# Patient Record
Sex: Male | Born: 1960
Health system: Southern US, Community
[De-identification: ages and names within clinical notes are randomized; demographics above are authoritative.]

## PROBLEM LIST (undated history)

## (undated) DIAGNOSIS — M1990 Unspecified osteoarthritis, unspecified site: Secondary | ICD-10-CM

## (undated) DIAGNOSIS — E119 Type 2 diabetes mellitus without complications: Secondary | ICD-10-CM

## (undated) DIAGNOSIS — R05 Cough: Secondary | ICD-10-CM

## (undated) DIAGNOSIS — F419 Anxiety disorder, unspecified: Secondary | ICD-10-CM

## (undated) DIAGNOSIS — I10 Essential (primary) hypertension: Secondary | ICD-10-CM

## (undated) DIAGNOSIS — E785 Hyperlipidemia, unspecified: Secondary | ICD-10-CM

## (undated) DIAGNOSIS — R059 Cough, unspecified: Secondary | ICD-10-CM

## (undated) DIAGNOSIS — K4090 Unilateral inguinal hernia, without obstruction or gangrene, not specified as recurrent: Secondary | ICD-10-CM

## (undated) HISTORY — PX: HERNIA REPAIR: SHX51

## (undated) HISTORY — PX: INGUINAL HERNIA REPAIR: SUR1180

## (undated) HISTORY — DX: Unilateral inguinal hernia, without obstruction or gangrene, not specified as recurrent: K40.90

## (undated) HISTORY — DX: Hyperlipidemia, unspecified: E78.5

## (undated) HISTORY — DX: Essential (primary) hypertension: I10

---

## 2006-07-11 ENCOUNTER — Emergency Department (HOSPITAL_COMMUNITY): Admission: EM | Admit: 2006-07-11 | Discharge: 2006-07-11 | Payer: Self-pay | Admitting: Emergency Medicine

## 2010-07-19 ENCOUNTER — Telehealth: Payer: Self-pay | Admitting: Cardiology

## 2010-08-11 ENCOUNTER — Ambulatory Visit: Payer: Self-pay | Admitting: Cardiology

## 2010-12-13 ENCOUNTER — Ambulatory Visit (INDEPENDENT_AMBULATORY_CARE_PROVIDER_SITE_OTHER): Payer: 59 | Admitting: General Surgery

## 2010-12-13 ENCOUNTER — Encounter (INDEPENDENT_AMBULATORY_CARE_PROVIDER_SITE_OTHER): Payer: Self-pay | Admitting: General Surgery

## 2010-12-13 VITALS — BP 120/78 | HR 72 | Temp 98.5°F | Ht 68.0 in | Wt 242.6 lb

## 2010-12-13 DIAGNOSIS — K4090 Unilateral inguinal hernia, without obstruction or gangrene, not specified as recurrent: Secondary | ICD-10-CM | POA: Insufficient documentation

## 2010-12-13 NOTE — Progress Notes (Signed)
Subjective:     Patient ID: Russell Robles, male   DOB: [birth year], 50 y.o.   MRN: [id number]    BP 120/78  Pulse 72  Temp 98.5 F (36.9 C)  Ht 5\' 8"  (1.727 m)  Wt 242 lb 9.6 oz (110.043 kg)  BMI 36.89 kg/m2    HPIThis is a 50 year old male who has about an eight-day history of left-sided groin pain his bulge. About 8 days ago he noticed some bloating and pain in his left groin as well as in his left testicle associated with some heavy activity. This has persisted. He was evaluated at [facility] where he was noted to have on CT scan a moderately large left inguinal hernia containing a loop of normal caliber bowel. He returns from his vacation and he was presented today to be evaluated for this new onset left lumbar hernia. He reports no nausea or vomiting. He reports normal bowel movements. He does state that this is worse with activity and better with rest.   Review of Systems  HENT: Negative.   Cardiovascular: Negative for chest pain.       Htn   Gastrointestinal: Negative.   Genitourinary: Negative.   Musculoskeletal: Negative.   Neurological: Negative.   Hematological: Negative.   Psychiatric/Behavioral: Negative.        Objective:   Physical Exam  Constitutional: He appears well-developed.  Neck: Neck supple.  Cardiovascular: Normal rate, regular rhythm and normal heart sounds.   No murmur heard. Pulmonary/Chest: Effort normal and breath sounds normal. No respiratory distress. He has no wheezes.  Abdominal: Soft. He exhibits no distension. There is no tenderness. Hernia confirmed negative in the right inguinal area. Left inguinal: reducilbe.  Lymphadenopathy:    He has no cervical adenopathy.       Right: No inguinal adenopathy present.       Left: No inguinal adenopathy present.       Assessment:    Left inguinal hernia DM HTN    Plan:      He has a symptomatic left inguinal hernia. We discussed open left inguinal hernia with mesh. The risks include,  but are not limited to, bleeding infection, recurrence, chronic left groin pain, seroma, as well as numbness on the medial portion of his left thigh. We discussed the indications for repair to be the symptomatic nature of his hernias as well as his young age and he is agreeable to that. Will plan on getting him scheduled as soon as possible for this hernia repaired due to with symptoms.

## 2010-12-14 ENCOUNTER — Encounter (HOSPITAL_BASED_OUTPATIENT_CLINIC_OR_DEPARTMENT_OTHER)
Admission: RE | Admit: 2010-12-14 | Discharge: 2010-12-14 | Disposition: A | Discharge status: Home / Self Care | Payer: 59 | Source: Ambulatory Visit | Attending: General Surgery | Admitting: General Surgery

## 2010-12-14 LAB — CBC
HCT: 39.5 % (ref 39.0–52.0)
Hemoglobin: 14 g/dL (ref 13.0–17.0)
MCH: 29.2 pg (ref 26.0–34.0)
MCHC: 35.4 g/dL (ref 30.0–36.0)
MCV: 82.5 fL (ref 78.0–100.0)
Platelets: 232 10*3/uL (ref 150–400)
RBC: 4.79 MIL/uL (ref 4.22–5.81)
RDW: 12.7 % (ref 11.5–15.5)
WBC: 9.3 10*3/uL (ref 4.0–10.5)

## 2010-12-14 LAB — BASIC METABOLIC PANEL
BUN: 14 mg/dL (ref 6–23)
CO2: 29 mEq/L (ref 19–32)
Calcium: 9.5 mg/dL (ref 8.4–10.5)
Chloride: 101 mEq/L (ref 96–112)
Creatinine, Ser: 0.9 mg/dL (ref 0.50–1.35)
GFR calc Af Amer: >60 mL/min (ref >60)
GFR calc non Af Amer: >60 mL/min (ref >60)
Glucose, Bld: 173 mg/dL — ABNORMAL HIGH (ref 70–99)
Potassium: 3.5 mEq/L (ref 3.5–5.1)
Sodium: 139 mEq/L (ref 135–145)

## 2010-12-14 LAB — DIFFERENTIAL
Basophils Absolute: 0 10*3/uL (ref 0.0–0.1)
Basophils Relative: 0 % (ref 0–1)
Eosinophils Absolute: 0.4 10*3/uL (ref 0.0–0.7)
Eosinophils Relative: 4 % (ref 0–5)
Lymphocytes Relative: 32 % (ref 12–46)
Lymphs Abs: 2.9 10*3/uL (ref 0.7–4.0)
Monocytes Absolute: 1.2 10*3/uL — ABNORMAL HIGH (ref 0.1–1.0)
Monocytes Relative: 13 % — ABNORMAL HIGH (ref 3–12)
Neutro Abs: 4.8 10*3/uL (ref 1.7–7.7)
Neutrophils Relative %: 51 % (ref 43–77)

## 2010-12-16 ENCOUNTER — Ambulatory Visit (HOSPITAL_BASED_OUTPATIENT_CLINIC_OR_DEPARTMENT_OTHER)
Admission: RE | Admit: 2010-12-16 | Discharge: 2010-12-16 | Disposition: A | Discharge status: Home / Self Care | Payer: 59 | Source: Ambulatory Visit | Attending: General Surgery | Admitting: General Surgery

## 2010-12-16 DIAGNOSIS — Z01812 Encounter for preprocedural laboratory examination: Secondary | ICD-10-CM | POA: Insufficient documentation

## 2010-12-16 DIAGNOSIS — Z01810 Encounter for preprocedural cardiovascular examination: Secondary | ICD-10-CM | POA: Insufficient documentation

## 2010-12-16 DIAGNOSIS — K4090 Unilateral inguinal hernia, without obstruction or gangrene, not specified as recurrent: Secondary | ICD-10-CM | POA: Insufficient documentation

## 2010-12-16 DIAGNOSIS — I10 Essential (primary) hypertension: Secondary | ICD-10-CM | POA: Insufficient documentation

## 2010-12-16 DIAGNOSIS — E119 Type 2 diabetes mellitus without complications: Secondary | ICD-10-CM | POA: Insufficient documentation

## 2010-12-16 LAB — GLUCOSE, CAPILLARY
Glucose-Capillary: 136 mg/dL — ABNORMAL HIGH (ref 70–99)
Glucose-Capillary: 206 mg/dL — ABNORMAL HIGH (ref 70–99)

## 2010-12-20 NOTE — Op Note (Signed)
NAME:  Russell Robles, Russell Robles NO.:  [id number]  MEDICAL RECORD NO.:  [id number]  LOCATION:                                 FACILITY:  PHYSICIAN:  Juanetta Gosling, MD     DATE OF BIRTH:  DATE OF PROCEDURE:  12/16/2010 DATE OF DISCHARGE:                              OPERATIVE REPORT   PREOPERATIVE DIAGNOSIS:  Left inguinal hernia.  POSTOPERATIVE DIAGNOSIS:  Large indirect left inguinal hernia.  PROCEDURE:  Left inguinal repair with Ultrapro hernia system.  SURGEON:  Juanetta Gosling, MD.  ASSISTANT:  None.  ANESTHESIA:  General with a TAP block.  SPECIMENS:  None.  DRAINS:  None.  COMPLICATIONS:  None.  ESTIMATED BLOOD LOSS:  Minimal.  DISPOSITION:  To recovery room in stable condition.  INDICATIONS:  This is a 50 year old male with a very symptomatic left inguinal hernia.  We discussed the risks and benefits of an open repair.  DESCRIPTION OF PROCEDURE:  After informed consent was obtained, the patient was taken to the operating room.  He was first administered a TAP block by Dr. Hart Robinsons.  He was then administered 1 gram of intravenous cefazolin.  Sequential compression devices were placed on lower extremities prior to induction with anesthesia.  He was then placed under general anesthesia without complication.  His groin was then prepped and draped in a standard sterile surgical fashion. Surgical time-out was then performed.  I made a generous left groin incision due to his body habitus.  This was a very difficult operation due to his body habitus.  I located the superficial epigastric veins and I ligated these.  I then finally was able to identify his external abdominal oblique and a large hernia that was present.  I incised his external oblique through his ring. Eventually, I was able to encircle his spermatic cord.  He had a large cord lipoma as well as a hernia sac present.  I ligated the hernia sac and placed this back inside  the abdomen and I excised the cord lipoma as well.  He had a fairly large indirect defect following this and a very weak floor and I elected to place a Ultrapro hernia system in this defect.  I developed a space with a Ray-Tec sponge.  I preserved the remainder of the cord structures as well as the vas deferens throughout the procedure.  Eventually, I was able to lay the bottom portion of the bilayer flat.  I pulled the top part open and laid this flat.  I made a T cut, wrapped it around the spermatic cord.  I then sutured this about every centimeter to the pubic tubercle twice through the inguinal ligament, tacking the T cut together into the inguinal ligament, as well as superiorly to the internal oblique.  The lateral portion eventually just laid flat under the external oblique.  This mesh appeared to fit well and that there was no evidence of any further defect.  I then irrigated.  Hemostasis was obtained.  I closed this with 2-0 Vicryl, 3-0 Vicryl, 4-0 Monocryl and Dermabond.  I injected an additional 10 mL of 4% Marcaine upon completion.  He tolerated  this well, was extubated in the operating room, and was transferred to the recovery room in stable condition.     Juanetta Gosling, MD     MCW/MEDQ  D:  12/16/2010  T:  12/16/2010  Job:  [id number]  cc:   Dewain Penning, MD  Electronically Signed by Emelia Loron MD on 12/20/2010 07:05:07 PM

## 2011-01-04 ENCOUNTER — Ambulatory Visit (INDEPENDENT_AMBULATORY_CARE_PROVIDER_SITE_OTHER): Payer: 59 | Admitting: General Surgery

## 2011-01-04 ENCOUNTER — Encounter (INDEPENDENT_AMBULATORY_CARE_PROVIDER_SITE_OTHER): Payer: Self-pay | Admitting: General Surgery

## 2011-01-04 DIAGNOSIS — Z09 Encounter for follow-up examination after completed treatment for conditions other than malignant neoplasm: Secondary | ICD-10-CM

## 2011-01-04 NOTE — Progress Notes (Signed)
Subjective:     Patient ID: Russell Robles, male   DOB: 1961-05-20, 50 y.o.   MRN: [id number]  HPI This is a 50 year old male with a symptomatic left groin hernia repair that I took to the operating room on June 28. He had a very large left renal hernia repair the repair with mesh. He had the expected postoperative swelling. He will he now is doing very well and only complains of a little bit of numbness on the lateral portion of the left side of the scrotum. He otherwise is doing a lot of his normal activity except for heavy lifting at this point. He has no other complaints.  Review of Systems     Objective:   Physical Exam Well healing left groin incision with no infection, minimal edema    Assessment:     S/p left inguinal hernia repair    Plan:        He is doing very well after assuring inguinal hernia repair. I told him he could go back to work on the 30th of this month. He is going to me if he has any questions or any other further problems otherwise we'll see him back as needed.

## 2011-02-02 ENCOUNTER — Emergency Department (INDEPENDENT_AMBULATORY_CARE_PROVIDER_SITE_OTHER): Payer: BC Managed Care – PPO

## 2011-02-02 ENCOUNTER — Emergency Department (HOSPITAL_BASED_OUTPATIENT_CLINIC_OR_DEPARTMENT_OTHER)
Admission: EM | Admit: 2011-02-02 | Discharge: 2011-02-03 | Disposition: A | Discharge status: Home / Self Care | Payer: BC Managed Care – PPO | Attending: Emergency Medicine | Admitting: Emergency Medicine

## 2011-02-02 ENCOUNTER — Encounter (HOSPITAL_BASED_OUTPATIENT_CLINIC_OR_DEPARTMENT_OTHER): Payer: Self-pay | Admitting: *Deleted

## 2011-02-02 DIAGNOSIS — W219XXA Striking against or struck by unspecified sports equipment, initial encounter: Secondary | ICD-10-CM | POA: Insufficient documentation

## 2011-02-02 DIAGNOSIS — Y9364 Activity, baseball: Secondary | ICD-10-CM | POA: Insufficient documentation

## 2011-02-02 DIAGNOSIS — E119 Type 2 diabetes mellitus without complications: Secondary | ICD-10-CM | POA: Insufficient documentation

## 2011-02-02 DIAGNOSIS — S63619A Unspecified sprain of unspecified finger, initial encounter: Secondary | ICD-10-CM

## 2011-02-02 DIAGNOSIS — X58XXXA Exposure to other specified factors, initial encounter: Secondary | ICD-10-CM

## 2011-02-02 DIAGNOSIS — S61219A Laceration without foreign body of unspecified finger without damage to nail, initial encounter: Secondary | ICD-10-CM

## 2011-02-02 DIAGNOSIS — I10 Essential (primary) hypertension: Secondary | ICD-10-CM | POA: Insufficient documentation

## 2011-02-02 DIAGNOSIS — Y9239 Other specified sports and athletic area as the place of occurrence of the external cause: Secondary | ICD-10-CM | POA: Insufficient documentation

## 2011-02-02 DIAGNOSIS — S61209A Unspecified open wound of unspecified finger without damage to nail, initial encounter: Secondary | ICD-10-CM | POA: Insufficient documentation

## 2011-02-02 DIAGNOSIS — S61409A Unspecified open wound of unspecified hand, initial encounter: Secondary | ICD-10-CM

## 2011-02-02 DIAGNOSIS — E785 Hyperlipidemia, unspecified: Secondary | ICD-10-CM | POA: Insufficient documentation

## 2011-02-02 DIAGNOSIS — S63259A Unspecified dislocation of unspecified finger, initial encounter: Secondary | ICD-10-CM

## 2011-02-02 DIAGNOSIS — S6390XA Sprain of unspecified part of unspecified wrist and hand, initial encounter: Secondary | ICD-10-CM | POA: Insufficient documentation

## 2011-02-02 MED ORDER — LIDOCAINE HCL 2 % IJ SOLN
INTRAMUSCULAR | Status: AC
  Filled 2011-02-02: qty 1

## 2011-02-02 MED ORDER — LIDOCAINE HCL 2 % IJ SOLN
20.0000 mL | Freq: Once | INTRAMUSCULAR | Status: AC
  Administered 2011-02-02: 20 mg via INTRADERMAL

## 2011-02-02 NOTE — ED Notes (Signed)
Playing softball and fell onto the ground when he was sliding. Left hand inj.

## 2011-02-03 MED ORDER — AMOXICILLIN-POT CLAVULANATE 875-125 MG PO TABS: 1.0000 | ORAL_TABLET | Freq: Two times a day (BID) | ORAL | Status: AC

## 2011-02-03 MED ORDER — IBUPROFEN 800 MG PO TABS: 800.0000 mg | ORAL_TABLET | Freq: Three times a day (TID) | ORAL | Status: AC | PRN

## 2011-02-03 NOTE — ED Provider Notes (Signed)
History    the patient presents for evaluation of injury to the left little finger. He reports that he was playing baseball game, was running, and dove head first to slide into base, and reports that he feels that he jammed his left little finger at the proximal interphalangeal joint. He at that time had pain, looked at his left little finger, and noted a laceration to the volar surface of the finger, and noted some mild deformity but he thought was a "jammed finger." He then pulled traction on the little finger with return of normal alignment. At this time he can flex and extend the left little finger at all joints without significant pain, except deep to the laceration at the volar surface. He has no apparent or significant swelling or deformity at this time otherwise. He denies any other injury to his hand, wrist, elbow, shoulder, neck, back or elsewhere. He denies head injury. He denies numbness distal to the injury.  CSN: [id number] Arrival date & time: 02/02/2011 10:22 PM  Chief Complaint  Patient presents with  . Hand Injury   Patient is a 50 y.o. male presenting with hand pain and skin laceration. The history is provided by the patient.  Hand Pain This is a new problem. The current episode started 1 to 2 hours ago. The problem occurs constantly. The problem has been resolved. Pertinent negatives include no chest pain, no abdominal pain, no headaches and no shortness of breath. The symptoms are aggravated by nothing. Relieved by: The patient reports at this time that there are no worsening factors for his pain, which has essentially resolved, after he had pulled traction on the finger to reduce apparent deformity or dislocation. The treatment provided significant (Significant relief reported after pulling traction on the little finger and presumably reducing a dislocation) relief.  Laceration  The incident occurred 1 to 2 hours ago. The laceration is located on the left hand (Volar surface of the  left little finger over the proximal interphalangeal joint). The laceration is 2 cm in size. The laceration mechanism is unknown (Sliding in a gravel/dirt baseball field).The pain is mild. The pain has been constant since onset. He reports no foreign bodies present. His tetanus status is UTD.    Past Medical History  Diagnosis Date  . Hypertension   . Diabetes mellitus   . Hyperlipidemia     History reviewed. No pertinent past surgical history.  Family History  Problem Relation Age of Onset  . COPD Mother   . Lung cancer Sister     History  Substance Use Topics  . Smoking status: Never Smoker   . Smokeless tobacco: Not on file  . Alcohol Use: No      Review of Systems  HENT: Negative.   Eyes: Negative.   Respiratory: Negative for shortness of breath.   Cardiovascular: Negative for chest pain.  Gastrointestinal: Negative for abdominal pain.  Genitourinary: Negative for flank pain.  Musculoskeletal: Negative for back pain, joint swelling and arthralgias.  Skin: Positive for wound.  Neurological: Negative for headaches.  Hematological: Does not bruise/bleed easily.  Psychiatric/Behavioral: Negative.     Physical Exam  BP 146/91  Pulse 87  Temp(Src) 99.4 F (37.4 C) (Oral)  Resp 20  SpO2 100%  Physical Exam  Nursing note and vitals reviewed. Constitutional: He is oriented to person, place, and time. He appears well-nourished. No distress.  HENT:  Head: Normocephalic and atraumatic.  Eyes: EOM are normal. Pupils are equal, round, and reactive to light.  Neck: Normal range of motion. Neck supple.  Cardiovascular: Normal rate and regular rhythm.   Pulmonary/Chest: Effort normal and breath sounds normal.  Abdominal: Soft. Bowel sounds are normal. He exhibits no distension. There is no tenderness.  Musculoskeletal: Normal range of motion. He exhibits no edema and no tenderness.       Left hand: He exhibits laceration. He exhibits normal range of motion, no  tenderness, no bony tenderness, normal two-point discrimination, normal capillary refill, no deformity and no swelling. normal sensation noted. Normal strength noted.       Hands:      The patient has no instability of the proximal interphalangeal joint of the left little finger, nor of any other joints of the hand. There is no deformity apparent to suggest fracture or dislocation at this time. The patient has full-strength and range of motion of flexion and extension of the left little finger as evaluated after local anesthetic applied to his laceration. There is no suggestion of tendinous disruption or joint instability.  Neurological: He is alert and oriented to person, place, and time. No cranial nerve deficit. Coordination normal.  Skin: Skin is warm and dry. No rash noted. No erythema.  Psychiatric: He has a normal mood and affect. His behavior is normal. Judgment and thought content normal.    ED Course  LACERATION REPAIR Performed by: Felisa Bonier Authorized by: Kennon Rounds D Consent: Verbal consent obtained. Written consent not obtained. Risks and benefits: risks, benefits and alternatives were discussed Consent given by: patient Patient understanding: patient states understanding of the procedure being performed Relevant documents: relevant documents present and verified Test results: test results available and properly labeled Imaging studies: imaging studies available Required items: required blood products, implants, devices, and special equipment available Patient identity confirmed: verbally with patient and arm band Time out: Immediately prior to procedure a "time out" was called to verify the correct patient, procedure, equipment, support staff and site/side marked as required. Body area: upper extremity Location details: left small finger Laceration length: 2 cm Contamination: The wound is contaminated. Foreign body present: Dirt and debris. Tendon involvement:  none Nerve involvement: none Vascular damage: no Anesthesia: local infiltration Local anesthetic: lidocaine 2% without epinephrine Anesthetic total: 2 ml Patient sedated: no Preparation: Patient was prepped and draped in the usual sterile fashion. Irrigation solution: tap water Irrigation method: tap Amount of cleaning: extensive (Extensive irrigation under running tap water was performed prior to extensive scrubbing of the wound with chlorhexidine based hand soap to wash out any dirt or debris after anesthesia was performed.) Debridement: none Degree of undermining: none Skin closure: 4-0 nylon Number of sutures: 4 Technique: simple Approximation: loose Approximation difficulty: simple Dressing: 4x4 sterile gauze, antibiotic ointment, gauze roll and splint Patient tolerance: Patient tolerated the procedure well with no immediate complications.    MDM No apparent fracture or dislocation is seen on the x-ray images of the patient's left hand. He has sustained a mildly contaminated left little finger volar laceration, simple otherwise, which I have repaired. Because of the reported history suggestive of a previous dislocation prior to arrival which the patient would then have self reduced with long jejunal traction, I am diagnosing him with a sprain of the left little finger and will treat him with analgesics by NSAIDs as well as a finger splint for a week. The patient was instructed on laceration care and followup.      Felisa Bonier, MD 02/03/11 [phone]

## 2011-02-03 NOTE — ED Notes (Signed)
Dressed finger with sterile dressing

## 2011-12-04 IMAGING — CR DG HAND COMPLETE 3+V*L*
3 series · 3 of 3 positions shown · non-contrast
Comparison: None.

CLINICAL DATA: Injury to the left hand there are a softball game.
Pain to the fifth digit with laceration at the PIP joint.

LEFT HAND - COMPLETE 3+ VIEW

[x hand pa left]
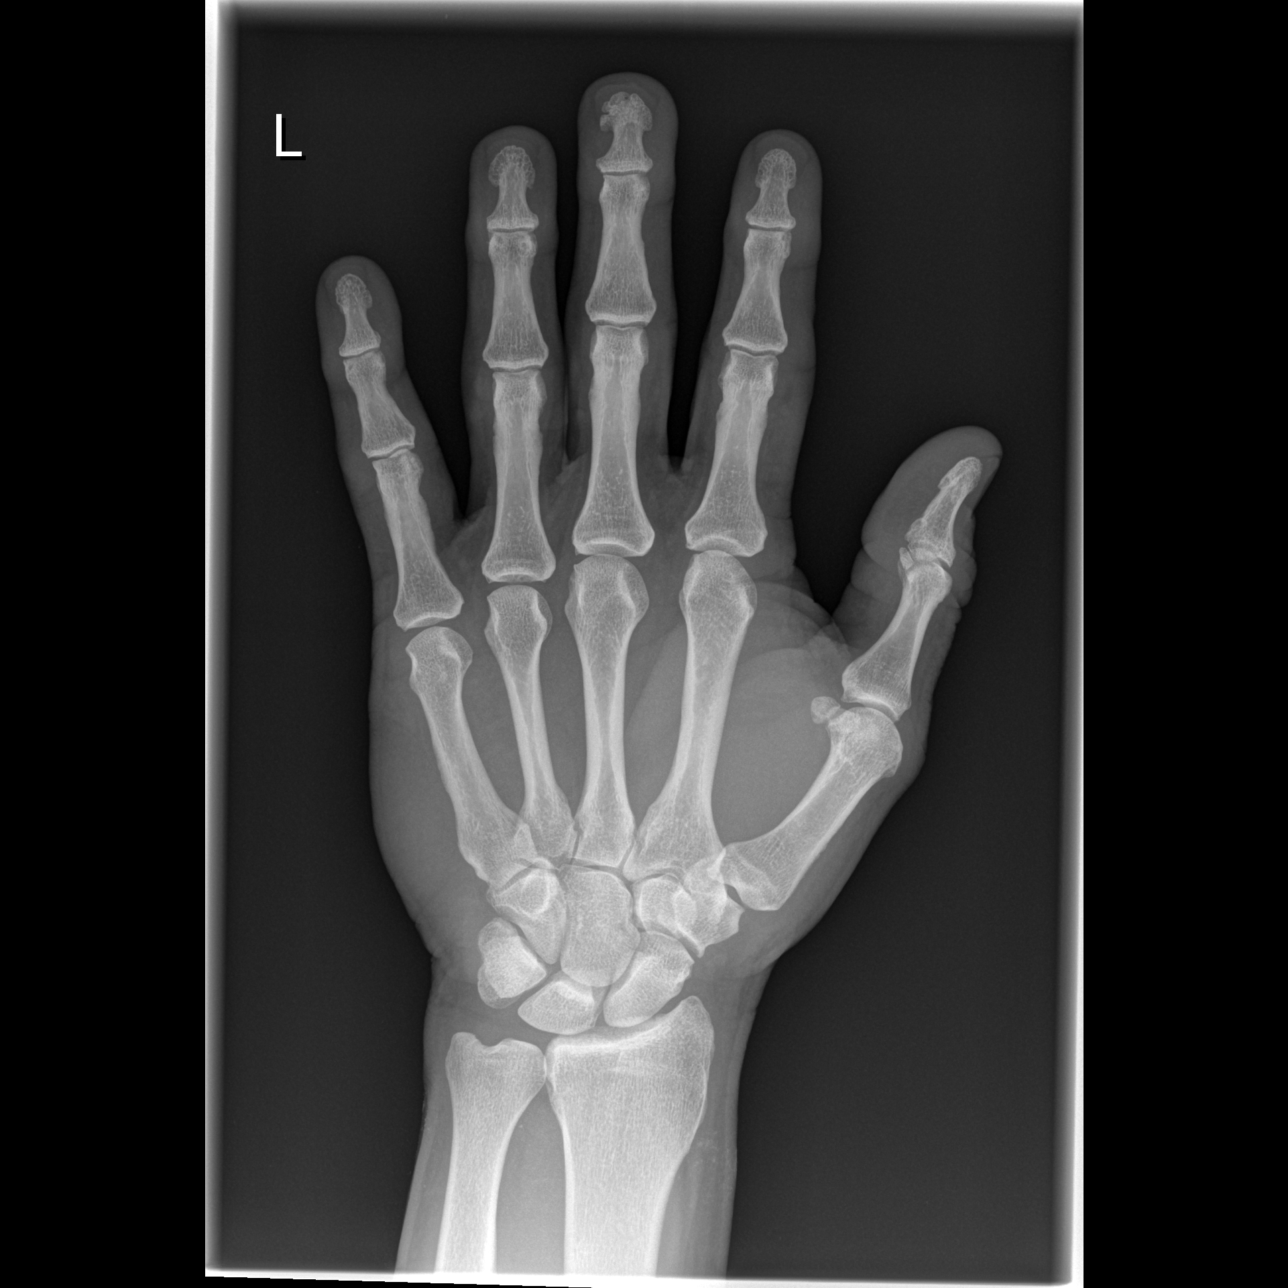

[x hand oblique left]
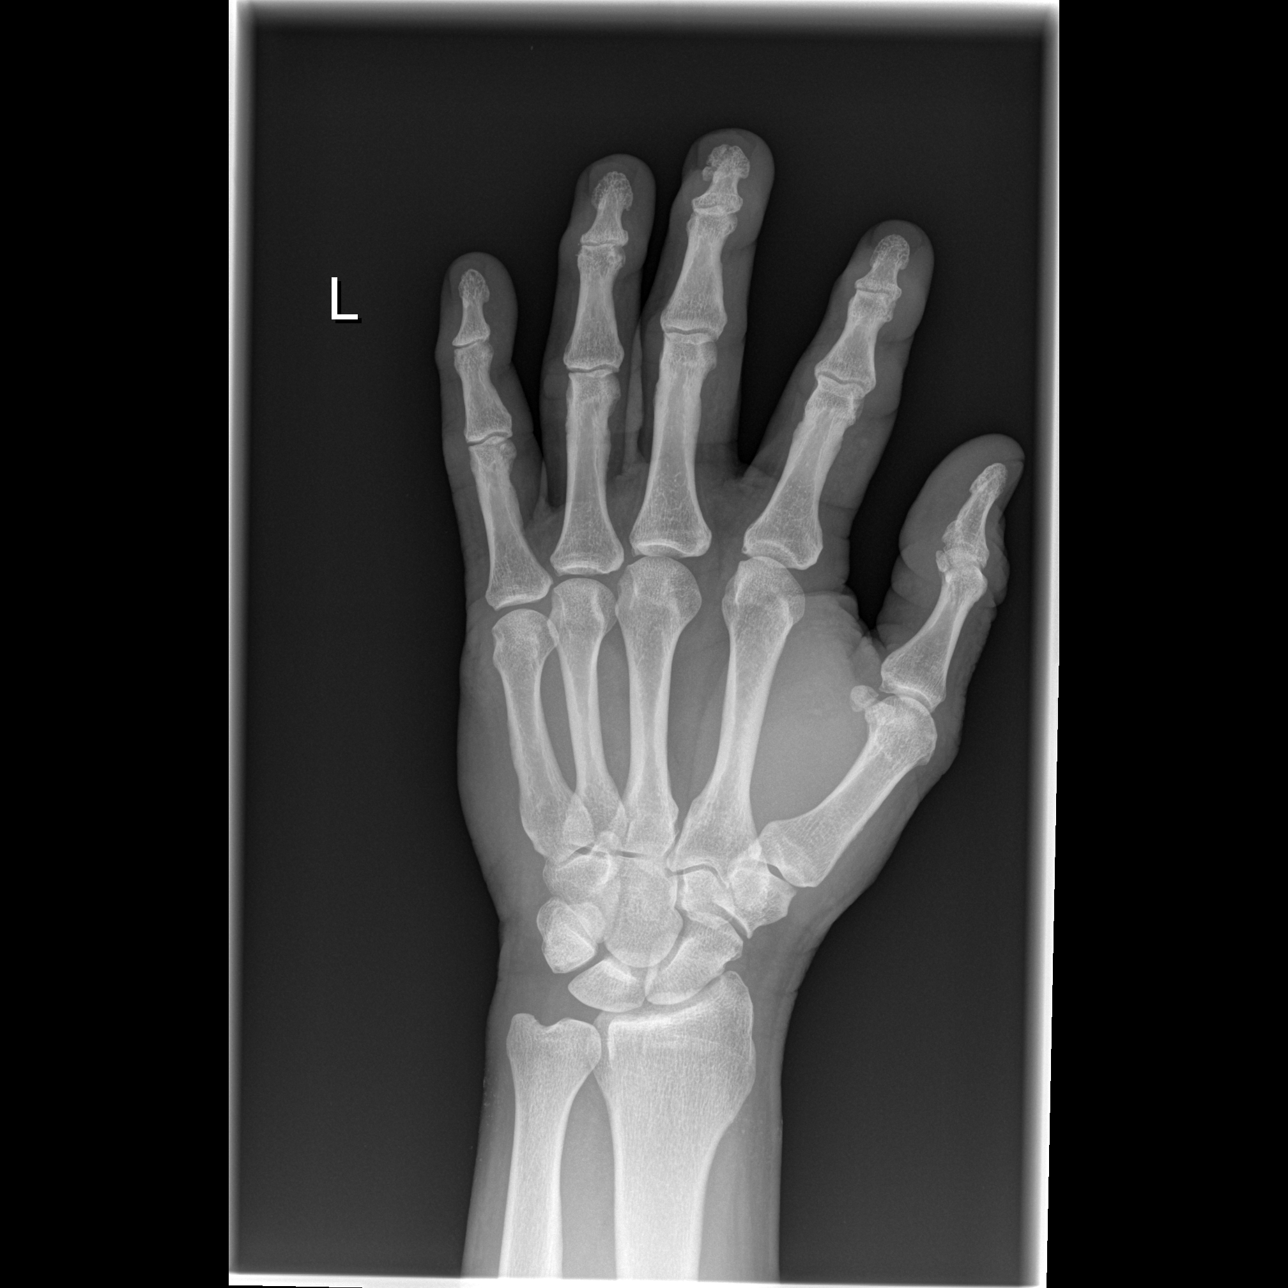

[x hand lat left]
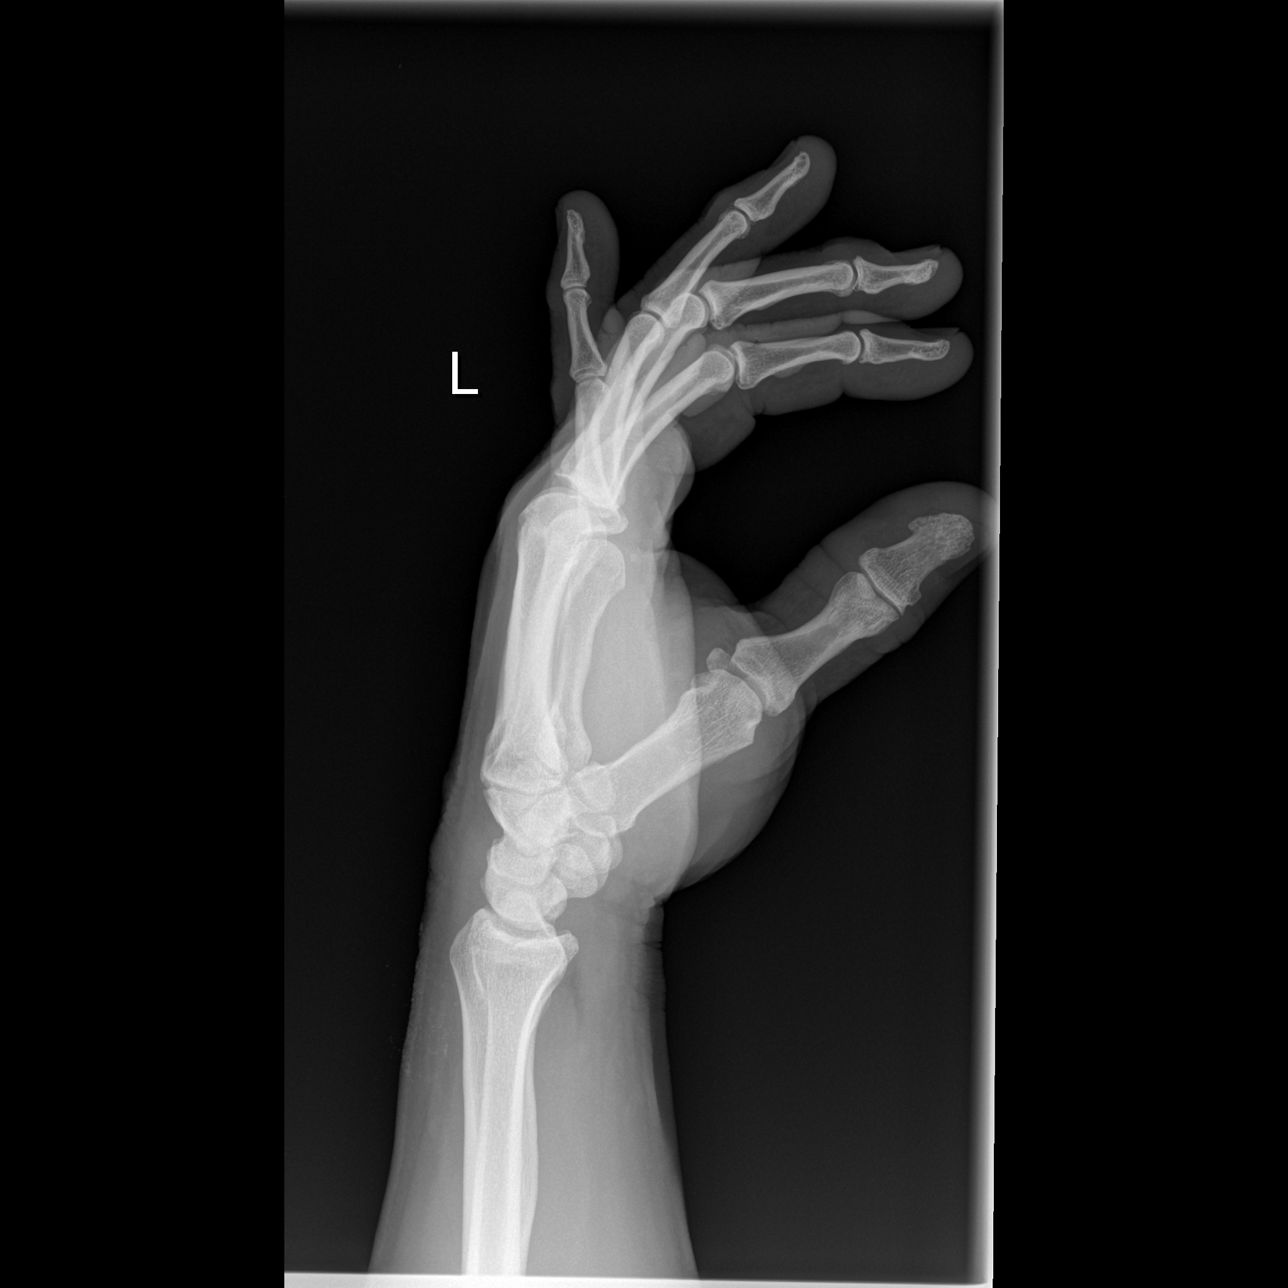

[3 of 3 positions shown; findings below may reference images not displayed]

FINDINGS: Mild degenerative changes in the STT joints, first
carpometacarpal joint, and distal interphalangeal joints. No
evidence of acute fracture or subluxation.  No focal bone lesions.
Bone matrix and cortex appear intact.  No abnormal radiopaque
densities in the soft tissues.
IMPRESSION: Mild degenerative changes.  No acute bony abnormalities
demonstrated.

## 2013-02-18 DEATH — deceased

## 2013-08-24 LAB — HEMOGLOBIN A1C: Hemoglobin A1C: 6.4

## 2014-09-30 ENCOUNTER — Other Ambulatory Visit: Admit: 2014-09-30 | Disposition: A | Discharge status: Home / Self Care | Payer: Self-pay

## 2014-09-30 LAB — SYNOVIAL CELL COUNT + DIFF, W/ CRYSTALS
Basophil: 0 %
Crystals, Joint Fluid: NONE SEEN
Eosinophil: 2 %
Lymphocytes: 18 %
Neutrophils: 76 %
Nucleated Cell Count: 1786 /mm3
Other Cells BF: 0 %
Other Mononuclear Cells: 4 %

## 2014-10-04 LAB — BODY FLUID CULTURE

## 2015-06-18 DIAGNOSIS — H2513 Age-related nuclear cataract, bilateral: Secondary | ICD-10-CM | POA: Insufficient documentation

## 2015-06-18 DIAGNOSIS — I152 Hypertension secondary to endocrine disorders: Secondary | ICD-10-CM | POA: Insufficient documentation

## 2015-06-18 DIAGNOSIS — H524 Presbyopia: Secondary | ICD-10-CM | POA: Insufficient documentation

## 2015-06-18 DIAGNOSIS — E1159 Type 2 diabetes mellitus with other circulatory complications: Secondary | ICD-10-CM | POA: Insufficient documentation

## 2015-06-18 DIAGNOSIS — E118 Type 2 diabetes mellitus with unspecified complications: Secondary | ICD-10-CM | POA: Insufficient documentation

## 2015-06-18 DIAGNOSIS — E1165 Type 2 diabetes mellitus with hyperglycemia: Secondary | ICD-10-CM | POA: Insufficient documentation

## 2015-06-18 DIAGNOSIS — E785 Hyperlipidemia, unspecified: Principal | ICD-10-CM

## 2015-06-18 DIAGNOSIS — E1169 Type 2 diabetes mellitus with other specified complication: Secondary | ICD-10-CM | POA: Insufficient documentation

## 2015-06-18 DIAGNOSIS — I10 Essential (primary) hypertension: Secondary | ICD-10-CM | POA: Insufficient documentation

## 2015-09-23 LAB — BASIC METABOLIC PANEL
BUN: 29 — AB (ref 4–21)
Creatinine: 1 (ref 0.6–1.3)
Glucose: 413
Potassium: 4 (ref 3.4–5.3)
Sodium: 130 — AB (ref 137–147)

## 2015-09-23 LAB — LIPID PANEL
Cholesterol: 180 (ref 0–200)
HDL: 34 — AB (ref 35–70)
LDL Cholesterol: 104
Triglycerides: 378 — AB (ref 40–160)

## 2015-09-23 LAB — HEMOGLOBIN A1C: Hemoglobin A1C: 13.1

## 2016-01-02 LAB — HEPATIC FUNCTION PANEL
ALT: 22 (ref 10–40)
AST: 19 (ref 14–40)
Alkaline Phosphatase: 54 (ref 25–125)
Bilirubin, Total: 0.6

## 2016-01-02 LAB — CBC AND DIFFERENTIAL
HCT: 41 (ref 41–53)
Hemoglobin: 14.5 (ref 13.5–17.5)
Platelets: 234 (ref 150–399)
WBC: 8.5

## 2016-01-02 LAB — HEMOGLOBIN A1C
Hemoglobin A1C: 6.2
Hemoglobin A1C: 6.7

## 2016-01-02 LAB — BASIC METABOLIC PANEL
BUN: 15 (ref 4–21)
Creatinine: 0.9 (ref 0.6–1.3)
Glucose: 153
Potassium: 3.7 (ref 3.4–5.3)
Sodium: 139 (ref 137–147)

## 2016-01-02 LAB — LIPID PANEL
Cholesterol: 126 (ref 0–200)
HDL: 36 (ref 35–70)
Triglycerides: 156 (ref 40–160)

## 2016-01-02 LAB — PSA: PSA: 0.36

## 2016-01-02 LAB — TSH: TSH: 0.47 (ref 0.41–5.90)

## 2016-10-22 LAB — HEMOGLOBIN A1C: Hemoglobin A1C: 7.4

## 2016-10-22 LAB — LIPID PANEL
Cholesterol: 114 (ref 0–200)
HDL: 34 — AB (ref 35–70)
LDL Cholesterol: 66
Triglycerides: 126 (ref 40–160)

## 2017-02-11 LAB — BASIC METABOLIC PANEL
BUN: 12 (ref 4–21)
Creatinine: 0.9 (ref 0.6–1.3)
Glucose: 180
Potassium: 3.7 (ref 3.4–5.3)
Sodium: 139 (ref 137–147)

## 2017-02-11 LAB — LIPID PANEL
Cholesterol: 131 (ref 0–200)
HDL: 32 — AB (ref 35–70)
LDL Cholesterol: 83
Triglycerides: 120 (ref 40–160)

## 2017-02-11 LAB — HEPATIC FUNCTION PANEL
ALT: 25 (ref 10–40)
AST: 24 (ref 14–40)
Alkaline Phosphatase: 53 (ref 25–125)
Bilirubin, Total: 0.8

## 2017-02-11 LAB — HEMOGLOBIN A1C: Hemoglobin A1C: 7

## 2017-02-11 LAB — CBC AND DIFFERENTIAL
HCT: 41 (ref 41–53)
Hemoglobin: 14.6 (ref 13.5–17.5)
Platelets: 218 (ref 150–399)
WBC: 8.7

## 2017-02-11 LAB — TSH: TSH: 0.8 (ref 0.41–5.90)

## 2017-04-06 ENCOUNTER — Ambulatory Visit: Payer: Self-pay | Admitting: General Surgery

## 2017-04-12 ENCOUNTER — Encounter: Payer: Self-pay | Admitting: Family Medicine

## 2017-04-12 ENCOUNTER — Ambulatory Visit: Payer: 59 | Admitting: Family Medicine

## 2017-04-12 VITALS — BP 133/88 | HR 59 | Ht 68.0 in | Wt 224.7 lb

## 2017-04-12 DIAGNOSIS — E1159 Type 2 diabetes mellitus with other circulatory complications: Secondary | ICD-10-CM

## 2017-04-12 DIAGNOSIS — E785 Hyperlipidemia, unspecified: Secondary | ICD-10-CM

## 2017-04-12 DIAGNOSIS — E66811 Obesity, class 1: Secondary | ICD-10-CM | POA: Insufficient documentation

## 2017-04-12 DIAGNOSIS — E119 Type 2 diabetes mellitus without complications: Secondary | ICD-10-CM

## 2017-04-12 DIAGNOSIS — E669 Obesity, unspecified: Secondary | ICD-10-CM

## 2017-04-12 DIAGNOSIS — I152 Hypertension secondary to endocrine disorders: Secondary | ICD-10-CM

## 2017-04-12 DIAGNOSIS — I10 Essential (primary) hypertension: Secondary | ICD-10-CM

## 2017-04-12 DIAGNOSIS — E1169 Type 2 diabetes mellitus with other specified complication: Secondary | ICD-10-CM

## 2017-04-12 DIAGNOSIS — Z723 Lack of physical exercise: Secondary | ICD-10-CM | POA: Insufficient documentation

## 2017-04-12 NOTE — Progress Notes (Signed)
New patient office visit note:  Impression and Recommendations:    1. Hyperlipidemia associated with type 2 diabetes mellitus ([employer])   2. Diabetes mellitus without complication ([place])   3. Hypertension associated with diabetes ([place])   4. Obesity, Class I, BMI 30-34.9   5. Inactivity      There are no diagnoses linked to this encounter.  No problem-specific Assessment & Plan notes found for this encounter.   The patient was counseled, risk factors were discussed, anticipatory guidance given.   New Prescriptions   No medications on file    Meds ordered this encounter  Medications  . Linagliptin-Metformin HCl (JENTADUETO) 2.10-998 MG TABS    Sig: Take 1 tablet by mouth 2 (two) times daily.  . metoprolol succinate (TOPROL-XL) 50 MG 24 hr tablet    Sig: Take 1 tablet by mouth daily.    Discontinued Medications   ATENOLOL (TENORMIN) 50 MG TABLET    Take 50 mg by mouth daily.     SITAGLIPTAN-METFORMIN (JANUMET) 50-1000 MG PER TABLET    Take 1 tablet by mouth 2 (two) times daily with a meal.      Modified Medications   No medications on file    Orders Placed This Encounter  Procedures  . Microalbumin / creatinine urine ratio  . Amb Referral to Nutrition and Diabetic E  . POCT glycosylated hemoglobin (Hb A1C)     Gross side effects, risk and benefits, and alternatives of medications discussed with patient.  Patient is aware that all medications have potential side effects and we are unable to predict every side effect or drug-drug interaction that may occur.  Expresses verbal understanding and consents to current therapy plan and treatment regimen.  Return in about 4 weeks (around 05/10/2017) for Dm , bp, nutrition counseling f/up, repeat A1c and UrineMicroalb.  Please see AVS handed out to patient at the end of our visit for further patient instructions/ counseling done pertaining to today's office visit.    Note: This document was prepared using Dragon voice  recognition software and may include unintentional dictation errors.  ----------------------------------------------------------------------------------------------------------------------    Subjective:    Chief complaint:   Chief Complaint  Patient presents with  . Establish Care     HPI: Russell Robles is a pleasant 56 y.o. male who presents to [place] at [facility] today to review their medical history with me and establish care.   I asked the patient to review their chronic problem list with me to ensure everything was updated and accurate.    All recent office visits with other providers, any medical records that patient brought in etc  - I reviewed today.     Also asked pt to get me medical records from [facility] providers/ specialists that they had seen within the past 3-5 years- if they are in private practice and/or do not work for a [employer], [facility], [place], [place] or [employer] owned practice.  Told them to call their specialists to clarify this if they are not sure.   Dr Charlcie Cradle- [facility] in [place].  Prior pcp.   Wife had gastric bypass couple yrs ago--> helped pt cut calories and lost about 40lbs or so in past 3-4 yrs.   Used to be athlete.     Late 80-90's.  Got dizzy and blacked out--> dx with HTN.   Used to be on atenolol and HCTZ.   On atenolol---> seemed like BP was better controlled.   -  Kidney function on 8\25\18 was serum creatinine 0.92, sodium 139, potassium 3.7, chloride 108, BUN 12, glucose 180.  LFTs   Pre-DM from early 90's- up to 2000 or so.  Then DM- dx about 10-35yrs. Started on metfrmin- then moved to janumet--> worked great but insurance changed and they didn't cover that med- so changed to [employer].   FBS- 140, 170, 180.  As low as 120- highest 183. A1c roughly.   --->  On 8\25\18 A1c was 7.0.  Prior A1c on 10/22/2016 was 7.4, on 01/02/2016 was 6.7, on 09/23/2015 A1c was 13.1, on 06/16/2015 A1c was 7.2, In on March 15, 2014 was 6.2  and August 24, 2013 was 6.4. --->  Dr Amalia Hailey, micheal wayne- optho at [facility]. Gets them yrly- No DM Retinopthy last done on 11/28/2016.    HLD- dx early 90's-   last cholesterol panel was done on 02/11/2017: total cholesterol 131, triglycerides 120, LDL 83 and HDL 32.   Were AST 24, ALT 25, alkaline phosphatase 53 and total bilirubin 0.8.   TSH recently obtained was 0.799.  Health Maintenance Status  Date Due Completion Dates  FOOT EXAM 01/05/2017 01/06/2016  INFLUENZA VACCINE 02/18/2017 06/19/2015  HEMOGLOBIN A1C 08/14/2017 02/11/2017, 10/22/2016  URINE MICROALBUMIN 10/22/2017 10/22/2016, 09/23/2015  OPHTHALMOLOGY EXAM 11/28/2017 11/28/2016, 11/28/2016  COLONOSCOPY 09/08/2021 09/09/2011 (N/S)  Comment on 09/09/2011: allscripts    Cardiac studies: 08/08/06   Problem  Diabetes Mellitus Without Complication ([name])  Hypertension Associated With Diabetes ([name])  Hyperlipidemia Associated With Type 2 Diabetes Mellitus ([name])  Obesity, Class I, Bmi 30-34.9  Inactivity  Age-Related Nuclear Cataract of Both Eyes      Wt Readings from Last 3 Encounters:  04/12/17 224 lb 11.2 oz (101.9 kg)  12/13/10 242 lb 9.6 oz (110 kg)   BP Readings from Last 3 Encounters:  04/12/17 133/88  02/02/11 146/91  12/13/10 120/78   Pulse Readings from Last 3 Encounters:  04/12/17 (!) 59  02/02/11 87  12/13/10 72   BMI Readings from Last 3 Encounters:  04/12/17 34.17 kg/m  12/13/10 36.89 kg/m    Patient Care Team    Relationship Specialty Notifications Start End  Mellody Dance, DO PCP - General Family Medicine  03/01/17     Patient Active Problem List   Diagnosis Date Noted  . Diabetes mellitus without complication ([place]) 66/11/3014    Priority: High  . Hypertension associated with diabetes ([place]) 06/18/2015    Priority: High  . Hyperlipidemia associated with type 2 diabetes mellitus ([place]) 06/18/2015    Priority: High  . Obesity, Class I, BMI 30-34.9 04/12/2017    Priority: Medium  . Inactivity  04/12/2017    Priority: Low  . Age-related nuclear cataract of both eyes 06/18/2015    Priority: Low  . Inguinal hernia unilateral, non-recurrent 12/13/2010     Past Medical History:  Diagnosis Date  . Diabetes mellitus   . Hyperlipidemia   . Hypertension      Past Medical History:  Diagnosis Date  . Diabetes mellitus   . Hyperlipidemia   . Hypertension      Past Surgical History:  Procedure Laterality Date  . HERNIA REPAIR Left 2015     Family History  Problem Relation Age of Onset  . COPD Mother   . Aortic aneurysm Father   . Lung cancer Sister      History  Drug Use No     History  Alcohol Use  . Yes    Comment: occ.     History  Smoking Status  . Never Smoker  Smokeless Tobacco  . Never Used     Outpatient Encounter Prescriptions as of 04/12/2017  Medication Sig  . Linagliptin-Metformin HCl (JENTADUETO) 2.10-998 MG TABS Take 1 tablet by mouth 2 (two) times daily.  . hydrochlorothiazide 25 MG tablet Take 25 mg by mouth daily.    . metoprolol succinate (TOPROL-XL) 50 MG 24 hr tablet Take 1 tablet by mouth daily.  . simvastatin (ZOCOR) 80 MG tablet Take 80 mg by mouth at bedtime.    . [DISCONTINUED] atenolol (TENORMIN) 50 MG tablet Take 50 mg by mouth daily.    . [DISCONTINUED] sitaGLIPtan-metformin (JANUMET) 50-1000 MG per tablet Take 1 tablet by mouth 2 (two) times daily with a meal.     No facility-administered encounter medications on file as of 04/12/2017.     Allergies: Patient has no known allergies.   ROS   Objective:   Blood pressure 133/88, pulse (!) 59, height 5\' 8"  (1.727 m), weight 224 lb 11.2 oz (101.9 kg). Body mass index is 34.17 kg/m. General: Well Developed, well nourished, and in no acute distress.  Neuro: Alert and oriented x3, extra-ocular muscles intact, sensation grossly intact.  HEENT:[place]/AT, PERRLA, neck supple, No carotid bruits Skin: no gross rashes  Cardiac: Regular rate and rhythm, Right SB 2/6  M Respiratory: Essentially clear to auscultation bilaterally. Not using accessory muscles, speaking in full sentences.  Abdominal: not grossly distended Musculoskeletal: Ambulates w/o diff, FROM * 4 ext.  Vasc: less 2 sec cap RF, warm and pink  Psych:  No HI/SI, judgement and insight good, Euthymic mood. Full Affect.    No results found for this or any previous visit (from the past 2160 hour(s)).

## 2017-04-12 NOTE — Patient Instructions (Signed)
Diabetes Mellitus and Standards of Medical Care  Managing diabetes (diabetes mellitus) can be complicated. Your diabetes treatment may be managed by a team of health care providers, including:  A diet and nutrition specialist (registered dietitian).  A nurse.  A certified diabetes educator (CDE).  A diabetes specialist (endocrinologist).  An eye doctor.  A primary care provider.  A dentist.  Your health care providers follow a schedule in order to help you get the best quality of care. The following schedule is a general guideline for your diabetes management plan. Your health care providers may also give you more specific instructions.  HbA1c (hemoglobin A1c) test This test provides information about blood sugar (glucose) control over the previous 2-3 months. It is used to check whether your diabetes management plan needs to be adjusted.  If you are meeting your treatment goals, this test is done at least 2 times a year.  If you are not meeting treatment goals or if your treatment goals have changed, this test is done 4 times a year.  Blood pressure test  This test is done at every routine medical visit. For most people, the goal is less than 130/80. Ask your health care provider what your goal blood pressure should be.  Dental and eye exams  Visit your dentist two times a year.  If you have type 1 diabetes, get an eye exam 3-5 years after you are diagnosed, and then once a year after your first exam. ? If you were diagnosed with type 1 diabetes as a child, get an eye exam when you are age 33 or older and have had diabetes for 3-5 years. After the first exam, you should get an eye exam once a year.  If you have type 2 diabetes, have an eye exam as soon as you are diagnosed, and then once a year after your first exam.  Foot care exam  Visual foot exams are done at every routine medical visit. The exams check for cuts, bruises, redness, blisters, sores, or other problems with  the feet.  A complete foot exam is done by your health care provider once a year. This exam includes an inspection of the structure and skin of your feet, and a check of the pulses and sensation in your feet. ? Type 1 diabetes: Get your first exam 3-5 years after diagnosis. ? Type 2 diabetes: Get your first exam as soon as you are diagnosed.  Check your feet every day for cuts, bruises, redness, blisters, or sores. If you have any of these or other problems that are not healing, contact your health care provider.  Kidney function test (urine microalbumin)  This test is done once a year. ? Type 1 diabetes: Get your first test 5 years after diagnosis. ? Type 2 diabetes: Get your first test as soon as you are diagnosed._  If you have chronic kidney disease (CKD), get a serum creatinine and estimated glomerular filtration rate (eGFR) test once a year.  Lipid profile (cholesterol, HDL, LDL, triglycerides)  This test should be done when you are diagnosed with diabetes, and every 5 years after the first test. If you are on medicines to lower your cholesterol, you may need to get this test done every year. ? The goal for LDL is less than 100 mg/dL (5.5 mmol/L). If you are at high risk, the goal is less than 70 mg/dL (3.9 mmol/L). ? The goal for HDL is 40 mg/dL (2.2 mmol/L) for men and 50 mg/dL(2.8 mmol/L)  for women. An HDL cholesterol of 60 mg/dL (3.3 mmol/L) or higher gives some protection against heart disease. ? The goal for triglycerides is less than 150 mg/dL (8.3 mmol/L).  Immunizations  The yearly flu (influenza) vaccine is recommended for everyone 6 months or older who has diabetes.  The pneumonia (pneumococcal) vaccine is recommended for everyone 2 years or older who has diabetes. If you are 42 or older, you may get the pneumonia vaccine as a series of two separate shots.  The hepatitis B vaccine is recommended for adults shortly after they have been diagnosed with diabetes.  The Tdap  (tetanus, diphtheria, and pertussis) vaccine should be given: ? According to normal childhood vaccination schedules, for children. ? Every 10 years, for adults who have diabetes.  The shingles vaccine is recommended for people who have had chicken pox and are 50 years or older.  Mental and emotional health  Screening for symptoms of eating disorders, anxiety, and depression is recommended at the time of diagnosis and afterward as needed. If your screening shows that you have symptoms (you have a positive screening result), you may need further evaluation and be referred to a mental health care provider.  Diabetes self-management education  Education about how to manage your diabetes is recommended at diagnosis and ongoing as needed.  Treatment plan  Your treatment plan will be reviewed at every medical visit.  Summary  Managing diabetes (diabetes mellitus) can be complicated. Your diabetes treatment may be managed by a team of health care providers.  Your health care providers follow a schedule in order to help you get the best quality of care.  Standards of care including having regular physical exams, blood tests, blood pressure monitoring, immunizations, screening tests, and education about how to manage your diabetes.  Your health care providers may also give you more specific instructions based on your individual health.      Type 2 Diabetes Mellitus, Self Care, Adult Caring for yourself after you have been diagnosed with type 2 diabetes (type 2 diabetes mellitus) means keeping your blood sugar (glucose) under control with a balance of:  Nutrition.  Exercise.  Lifestyle changes.  Medicines or insulin, if necessary.  Support from your team of health care providers and others.  The following information explains what you need to know to manage your diabetes at home. What do I need to do to manage my blood glucose?  Check your blood glucose every day, as often as  told by your health care provider.  Contact your health care provider if your blood glucose is above your target for 2 tests in a row.  Have your A1c (hemoglobin A1c) level checked at least two times a year, or as often as told by your health care provider. Your health care provider will set individualized treatment goals for you. Generally, the goal of treatment is to maintain the following blood glucose levels:  Before meals (preprandial): 80-130 mg/dL (4.4-7.2 mmol/L).  After meals (postprandial): below 180 mg/dL (10 mmol/L).  A1c level: less than 7%.  What do I need to know about hyperglycemia and hypoglycemia? What is hyperglycemia? Hyperglycemia, also called high blood glucose, occurs when blood glucose is too high.Make sure you know the early signs of hyperglycemia, such as:  Increased thirst.  Hunger.  Feeling very tired.  Needing to urinate more often than usual.  Blurry vision.  What is hypoglycemia? Hypoglycemia, also called low blood glucose, occurswith a blood glucose level at or below 70 mg/dL (3.9 mmol/L).  The risk for hypoglycemia increases during or after exercise, during sleep, during illness, and when skipping meals or not eating for a long time (fasting). It is important to know the symptoms of hypoglycemia and treat it right away. Always have a 15-gram rapid-acting carbohydrate snack with you to treat low blood glucose. Family members and close friends should also know the symptoms and should understand how to treat hypoglycemia, in case you are not able to treat yourself. What are the symptoms of hypoglycemia? Hypoglycemia symptoms can include:  Hunger.  Anxiety.  Sweating and feeling clammy.  Confusion.  Dizziness or feeling light-headed.  Sleepiness.  Nausea.  Increased heart rate.  Headache.  Blurry vision.  Seizure.  Nightmares.  Tingling or numbness around the mouth, lips, or tongue.  A change in speech.  Decreased ability to  concentrate.  A change in coordination.  Restless sleep.  Tremors or shakes.  Fainting.  Irritability.  How do I treat hypoglycemia?  If you are alert and able to swallow safely, follow the 15:15 rule:  Take 15 grams of a rapid-acting carbohydrate. Rapid-acting options include: ? 1 tube of glucose gel. ? 3 glucose pills. ? 6-8 pieces of hard candy. ? 4 oz (120 mL) of fruit juice. ? 4 oz (120 mL) of regular (not diet) soda.  Check your blood glucose 15 minutes after you take the carbohydrate.  If the repeat blood glucose level is still at or below 70 mg/dL (3.9 mmol/L), take 15 grams of a carbohydrate again.  If your blood glucose level does not increase above 70 mg/dL (3.9 mmol/L) after 3 tries, seek emergency medical care.  After your blood glucose level returns to normal, eat a meal or a snack within 1 hour.  How do I treat severe hypoglycemia? Severe hypoglycemia is when your blood glucose level is at or below 54 mg/dL (3 mmol/L). Severe hypoglycemia is an emergency. Do not wait to see if the symptoms will go away. Get medical help right away. Call your local emergency services (911 in the [place] Do not drive yourself to the hospital. If you have severe hypoglycemia and you cannot eat or drink, you may need an injection of glucagon. A family member or close friend should learn how to check your blood glucose and how to give you a glucagon injection. Ask your health care provider if you need to have an emergency glucagon injection kit available. Severe hypoglycemia may need to be treated in a hospital. The treatment may include getting glucose through an IV tube. You may also need treatment for the cause of your hypoglycemia. Can having diabetes put me at risk for other conditions? Having diabetes can put you at risk for other long-term (chronic) conditions, such as heart disease and kidney disease. Your health care provider may prescribe medicines to help prevent complications  from diabetes. These medicines may include:  Aspirin.  Medicine to lower cholesterol.  Medicine to control blood pressure.  What else can I do to manage my diabetes? Take your diabetes medicines as told  If your health care provider prescribed insulin or diabetes medicines, take them every day.  Do not run out of insulin or other diabetes medicines that you take. Plan ahead so you always have these available.  If you use insulin, adjust your dosage based on how physically active you are and what foods you eat. Your health care provider will tell you how to adjust your dosage. Make healthy food choices  The things that you eat   and drink affect your blood glucose and your insulin dosage. Making good choices helps to control your diabetes and prevent other health problems. A healthy meal plan includes eating lean proteins, complex carbohydrates, fresh fruits and vegetables, low-fat dairy products, and healthy fats. Make an appointment to see a diet and nutrition specialist (registered dietitian) to help you create an eating plan that is right for you. Make sure that you:  Follow instructions from your health care provider about eating or drinking restrictions.  Drink enough fluid to keep your urine clear or pale yellow.  Eat healthy snacks between nutritious meals.  Track the carbohydrates that you eat. Do this by reading food labels and learning the standard serving sizes of foods.  Follow your sick day plan whenever you cannot eat or drink as usual. Make this plan in advance with your health care provider.  Stay active  Exercise regularly, as told by your health care provider. This may include:  Stretching and doing strength exercises, such as yoga or weightlifting, at least 2 times a week.  Doing at least 150 minutes of moderate-intensity or vigorous-intensity exercise each week. This could be brisk walking, biking, or water aerobics. ? Spread out your activity over at least 3  days of the week. ? Do not go more than 2 days in a row without doing some kind of physical activity.  When you start a new exercise or activity, work with your health care provider to adjust your insulin, medicines, or food intake as needed. Make healthy lifestyle choices  Do not use any tobacco products, such as cigarettes, chewing tobacco, and e-cigarettes. If you need help quitting, ask your health care provider.  If your health care provider says that alcohol is safe for you, limit alcohol intake to no more than 1 drink per day for nonpregnant women and 2 drinks per day for men. One drink equals 12 oz of beer, 5 oz of wine, or 1 oz of hard liquor.  Learn to manage stress. If you need help with this, ask your health care provider. Care for your body   Keep your immunizations up to date. In addition to getting vaccinations as told by your health care provider, it is recommended that you get vaccinated against the following illnesses: ? The flu (influenza). Get a flu shot every year. ? Pneumonia. ? Hepatitis B.  Schedule an eye exam soon after your diagnosis, and then one time every year after that.  Check your skin and feet every day for cuts, bruises, redness, blisters, or sores. Schedule a foot exam with your health care provider once every year.  Brush your teeth and gums two times a day, and floss at least one time a day. Visit your dentist at least once every 6 months.  Maintain a healthy weight. General instructions  Take over-the-counter and prescription medicines only as told by your health care provider.  Share your diabetes management plan with people in your workplace, school, and household.  Check your urine for ketones when you are ill and as told by your health care provider.  Ask your health care provider: ? Do I need to meet with a diabetes educator? ? Where can I find a support group for people with diabetes?  Carry a medical alert card or wear medical alert  jewelry.  Keep all follow-up visits as told by your health care provider. This is important. Where to find more information: For more information about diabetes, visit:  American Diabetes Association (ADA):   www.diabetes.org  American Association of Diabetes Educators (AADE): www.diabeteseducator.org/patient-resources  This information is not intended to replace advice given to you by your health care provider. Make sure you discuss any questions you have with your health care provider. Document Released: 09/28/2015 Document Revised: 11/12/2015 Document Reviewed: 07/10/2015 Elsevier Interactive Patient Education  2017 [place].      Blood Glucose Monitoring, Adult Monitoring your blood sugar (glucose) helps you manage your diabetes. It also helps you and your health care provider determine how well your diabetes management plan is working. Blood glucose monitoring involves checking your blood glucose as often as directed, and keeping a record (log) of your results over time. Why should I monitor my blood glucose? Checking your blood glucose regularly can:  Help you understand how food, exercise, illnesses, and medicines affect your blood glucose.  Let you know what your blood glucose is at any time. You can quickly tell if you are having low blood glucose (hypoglycemia) or high blood glucose (hyperglycemia).  Help you and your health care provider adjust your medicines as needed.  When should I check my blood glucose? Follow instructions from your health care provider about how often to check your blood glucose.   This may depend on:  The type of diabetes you have.  How well-controlled your diabetes is.  Medicines you are taking.  If you have type 2 diabetes:  If you take insulin or other diabetes medicines, check your blood glucose at least 2 times a day.  If you are on intensive insulin therapy, check your blood glucose at least 4 times a day. Occasionally, you may  also need to check between 2:00 a.m. and 3:00 a.m., as directed.  Also check your blood glucose: ? Before and after exercise. ? Before potentially dangerous tasks, like driving or using heavy machinery.  You may need to check your blood glucose more often if: ? Your medicine is being adjusted. ? Your diabetes is not well-controlled. ? You are ill.  What is a blood glucose log?  A blood glucose log is a record of your blood glucose readings. It helps you and your health care provider: ? Look for patterns in your blood glucose over time. ? Adjust your diabetes management plan as needed.  Every time you check your blood glucose, write down your result and notes about things that may be affecting your blood glucose, such as your diet and exercise for the day.  Most glucose meters store a record of glucose readings in the meter. Some meters allow you to download your records to a computer. How do I check my blood glucose? Follow these steps to get accurate readings of your blood glucose: Supplies needed   Blood glucose meter.  Test strips for your meter. Each meter has its own strips. You must use the strips that come with your meter.  A needle to prick your finger (lancet). Do not use lancets more than once.  A device that holds the lancet (lancing device).  A journal or log book to write down your results.  Procedure  Wash your hands with soap and water.  Prick the side of your finger (not the tip) with the lancet. Use a different finger each time.  Gently rub the finger until a small drop of blood appears.  Follow instructions that come with your meter for inserting the test strip, applying blood to the strip, and using your blood glucose meter.  Write down your result and any notes.  Alternative  testing sites  Some meters allow you to use areas of your body other than your finger (alternative sites) to test your blood.  If you think you may have hypoglycemia, or if  you have hypoglycemia unawareness, do not use alternative sites. Use your finger instead.  Alternative sites may not be as accurate as the fingers, because blood flow is slower in these areas. This means that the result you get may be delayed, and it may be different from the result that you would get from your finger.  The most common alternative sites are: ? Forearm. ? Thigh. ? Palm of the hand.  Additional tips  Always keep your supplies with you.  If you have questions or need help, all blood glucose meters have a 24-hour "hotline" number that you can call. You may also contact your health care provider.  After you use a few boxes of test strips, adjust (calibrate) your blood glucose meter by following instructions that came with your meter.  The American Diabetes Association suggests the following targets for most nonpregnant adults with diabetes.  More or less stringent glycemic goals may be appropriate for each individual.  A1C: Less than 7% A1C may also be reported as eAG: Less than 154 mg/dl Before a meal (preprandial plasma glucose): 80-130 mg/dl 1-2 hours after beginning of the meal (Postprandial plasma glucose)*: Less than 180 mg/dl  *Postprandial glucose may be targeted if A1C goals are not met despite reaching preprandial glucose goals.   This information is not intended to replace advice given to you by your health care provider. Make sure you discuss any questions you have with your health care provider. Document Released: 06/09/2003 Document Revised: 12/25/2015 Document Reviewed: 11/16/2015 Elsevier Interactive Patient Education  2017 [place].     Please realize, EXERCISE IS MEDICINE!  -  American Heart Association ( AHA) guidelines for exercise : If you are in good health, without any medical conditions, you should engage in 150 minutes of moderate intensity aerobic activity per week.  This means you should be huffing and puffing throughout your workout.    Engaging in regular exercise will improve brain function and memory, as well as improve mood, boost immune system and help with weight management.  As well as the other, more well-known effects of exercise such as decreasing blood sugar levels, decreasing blood pressure,  and decreasing bad cholesterol levels/ increasing good cholesterol levels.     -  The AHA strongly endorses consumption of a diet that contains a variety of foods from all the food categories with an emphasis on fruits and vegetables; fat-free and low-fat dairy products; cereal and grain products; legumes and nuts; and fish, poultry, and/or extra lean meats.    Excessive food intake, especially of foods high in saturated and trans fats, sugar, and salt, should be avoided.    Adequate water intake of roughly 1/2 of your weight in pounds, should equal the ounces of water per day you should drink.  So for instance, if you're 200 pounds, that would be 100 ounces of water per day.         Mediterranean Diet  Why follow it? Research shows. . Those who follow the Mediterranean diet have a reduced risk of heart disease  . The diet is associated with a reduced incidence of Parkinson's and Alzheimer's diseases . People following the diet may have longer life expectancies and lower rates of chronic diseases  . The Dietary Guidelines for Americans recommends the Mediterranean diet as  an eating plan to promote health and prevent disease  What Is the Mediterranean Diet?  . Healthy eating plan based on typical foods and recipes of Mediterranean-style cooking . The diet is primarily a plant based diet; these foods should make up a majority of meals   Starches - Plant based foods should make up a majority of meals - They are an important sources of vitamins, minerals, energy, antioxidants, and fiber - Choose whole grains, foods high in fiber and minimally processed items  - Typical grain sources include wheat, oats, barley, corn, brown rice,  bulgar, farro, millet, polenta, couscous  - Various types of beans include chickpeas, lentils, fava beans, black beans, white beans   Fruits  Veggies - Large quantities of antioxidant rich fruits & veggies; 6 or more servings  - Vegetables can be eaten raw or lightly drizzled with oil and cooked  - Vegetables common to the traditional Mediterranean Diet include: artichokes, arugula, beets, broccoli, brussel sprouts, cabbage, carrots, celery, collard greens, cucumbers, eggplant, kale, leeks, lemons, lettuce, mushrooms, okra, onions, peas, peppers, potatoes, pumpkin, radishes, rutabaga, shallots, spinach, sweet potatoes, turnips, zucchini - Fruits common to the Mediterranean Diet include: apples, apricots, avocados, cherries, clementines, dates, figs, grapefruits, grapes, melons, nectarines, oranges, peaches, pears, pomegranates, strawberries, tangerines  Fats - Replace butter and margarine with healthy oils, such as olive oil, canola oil, and tahini  - Limit nuts to no more than a handful a day  - Nuts include walnuts, almonds, pecans, pistachios, pine nuts  - Limit or avoid candied, honey roasted or heavily salted nuts - Olives are central to the [employer] - can be eaten whole or used in a variety of dishes   Meats Protein - Limiting red meat: no more than a few times a month - When eating red meat: choose lean cuts and keep the portion to the size of deck of cards - Eggs: approx. 0 to 4 times a week  - Fish and lean poultry: at least 2 a week  - Healthy protein sources include, chicken, [place], lean beef, lamb - Increase intake of seafood such as tuna, salmon, trout, mackerel, shrimp, scallops - Avoid or limit high fat processed meats such as sausage and bacon  Dairy - Include moderate amounts of low fat dairy products  - Focus on healthy dairy such as fat free yogurt, skim milk, low or reduced fat cheese - Limit dairy products higher in fat such as whole or 2% milk, cheese, ice cream    Alcohol - Moderate amounts of red wine is ok  - No more than 5 oz daily for women (all ages) and men older than age 5  - No more than 10 oz of wine daily for men younger than 65  Other - Limit sweets and other desserts  - Use herbs and spices instead of salt to flavor foods  - Herbs and spices common to the traditional Mediterranean Diet include: basil, bay leaves, chives, cloves, cumin, fennel, garlic, lavender, marjoram, mint, oregano, parsley, pepper, rosemary, sage, savory, sumac, tarragon, thyme   It's not just a diet, it's a lifestyle:  . The Mediterranean diet includes lifestyle factors typical of those in the region  . Foods, drinks and meals are best eaten with others and savored . Daily physical activity is important for overall good health . This could be strenuous exercise like running and aerobics . This could also be more leisurely activities such as walking, housework, yard-work, or taking the stairs . Moderation  is the key; a balanced and healthy diet accommodates most foods and drinks . Consider portion sizes and frequency of consumption of certain foods   Meal Ideas & Options:  . Breakfast:  o Whole wheat toast or whole wheat English muffins with peanut butter & hard boiled egg o Steel cut oats topped with apples & cinnamon and skim milk  o Fresh fruit: banana, strawberries, melon, berries, peaches  o Smoothies: strawberries, bananas, greek yogurt, peanut butter o Low fat greek yogurt with blueberries and granola  o Egg white omelet with spinach and mushrooms o Breakfast couscous: whole wheat couscous, apricots, skim milk, cranberries  . Sandwiches:  o Hummus and grilled vegetables (peppers, zucchini, squash) on whole wheat bread   o Grilled chicken on whole wheat pita with lettuce, tomatoes, cucumbers or tzatziki  o Tuna salad on whole wheat bread: tuna salad made with greek yogurt, olives, red peppers, capers, green onions o Garlic rosemary lamb pita: lamb sauted  with garlic, rosemary, salt & pepper; add lettuce, cucumber, greek yogurt to pita - flavor with lemon juice and black pepper  . Seafood:  o Mediterranean grilled salmon, seasoned with garlic, basil, parsley, lemon juice and black pepper o Shrimp, lemon, and spinach whole-grain pasta salad made with low fat greek yogurt  o Seared scallops with lemon orzo  o Seared tuna steaks seasoned salt, pepper, coriander topped with tomato mixture of olives, tomatoes, olive oil, minced garlic, parsley, green onions and cappers  . Meats:  o Herbed greek chicken salad with kalamata olives, cucumber, feta  o Red bell peppers stuffed with spinach, bulgur, lean ground beef (or lentils) & topped with feta   o Kebabs: skewers of chicken, tomatoes, onions, zucchini, squash  o [place] burgers: made with red onions, mint, dill, lemon juice, feta cheese topped with roasted red peppers . Vegetarian o Cucumber salad: cucumbers, artichoke hearts, celery, red onion, feta cheese, tossed in olive oil & lemon juice  o Hummus and whole grain pita points with a greek salad (lettuce, tomato, feta, olives, cucumbers, red onion) o Lentil soup with celery, carrots made with vegetable broth, garlic, salt and pepper  o Tabouli salad: parsley, bulgur, mint, scallions, cucumbers, tomato, radishes, lemon juice, olive oil, salt and pepper.

## 2017-04-24 ENCOUNTER — Encounter: Payer: Self-pay | Admitting: Dietician

## 2017-04-24 ENCOUNTER — Encounter: Payer: 59 | Attending: Family Medicine | Admitting: Dietician

## 2017-04-24 VITALS — BP 124/82 | Ht 68.0 in | Wt 224.9 lb

## 2017-04-24 DIAGNOSIS — Z713 Dietary counseling and surveillance: Secondary | ICD-10-CM | POA: Diagnosis present

## 2017-04-24 DIAGNOSIS — E119 Type 2 diabetes mellitus without complications: Secondary | ICD-10-CM

## 2017-04-24 DIAGNOSIS — I10 Essential (primary) hypertension: Secondary | ICD-10-CM | POA: Insufficient documentation

## 2017-04-24 DIAGNOSIS — E785 Hyperlipidemia, unspecified: Secondary | ICD-10-CM | POA: Insufficient documentation

## 2017-04-24 DIAGNOSIS — E1159 Type 2 diabetes mellitus with other circulatory complications: Secondary | ICD-10-CM | POA: Insufficient documentation

## 2017-04-24 DIAGNOSIS — E1169 Type 2 diabetes mellitus with other specified complication: Secondary | ICD-10-CM | POA: Diagnosis not present

## 2017-04-24 NOTE — Progress Notes (Signed)
Diabetes Self-Management Education  Visit Type: First/Initial  Appt. Start Time: 1600 Appt. End Time: 1700  04/24/2017  Russell Robles, identified by name and date of birth, is a 56 y.o. male with a diagnosis of Diabetes: Type 2.   ASSESSMENT  Blood pressure 124/82, height 5\' 8"  (1.727 m), weight 224 lb 14.4 oz (102 kg). Body mass index is 34.2 kg/m.  Diabetes Self-Management Education - 04/24/17 1910      Visit Information   Visit Type  First/Initial      Initial Visit   Diabetes Type  Type 2      Health Coping   How would you rate your overall health?  Good      Psychosocial Assessment   Patient Belief/Attitude about Diabetes  Motivated to manage diabetes    Self-care barriers  None    Self-management support  Doctor's office    Other persons present  Patient    Patient Concerns  Weight Control;Medication;Glycemic Control    Special Needs  None    Preferred Learning Style  Auditory;Visual;Hands on    Learning Readiness  Ready    What is the last grade level you completed in school?  1 year college      Pre-Education Assessment   Patient understands the diabetes disease and treatment process.  Needs Review    Patient understands incorporating nutritional management into lifestyle.  Needs Review    Patient undertands incorporating physical activity into lifestyle.  Needs Instruction    Patient understands using medications safely.  Needs Review    Patient understands monitoring blood glucose, interpreting and using results  Needs Review    Patient understands prevention, detection, and treatment of acute complications.  Needs Instruction    Patient understands prevention, detection, and treatment of chronic complications.  Needs Review    Patient understands how to develop strategies to address psychosocial issues.  Needs Instruction    Patient understands how to develop strategies to promote health/change behavior.  Needs Review      Complications   How often do you  check your blood sugar?  1-2 times/day    Fasting Blood glucose range (mg/dL)  70-129;180-200;130-179    Have you had a dilated eye exam in the past 12 months?  Yes 08-2016   08-2016   Have you had a dental exam in the past 12 months?  Yes 3 months ago   3 months ago   Are you checking your feet?  No      Dietary Intake   Breakfast  skips breakfast    Snack (morning)  eats fruit or nabs at 7:30a-10:30a    Lunch  eats lunch at 12p; eats sweets/desserts and snack foods 4-5x/wk; eats fried foods 2-3x/wk    Snack (afternoon)  none    Dinner  eats supper at 6p-9p    Snack (evening)  none    Beverage(s)  drinks water 4-5x/day, regular sodas 2-3x/day, milk 2x/day      Exercise   Exercise Type  ADL's      Patient Education   Previous Diabetes Education  No    Disease state   Explored patient's options for treatment of their diabetes;Definition of diabetes, type 1 and 2, and the diagnosis of diabetes    Nutrition management   Role of diet in the treatment of diabetes and the relationship between the three main macronutrients and blood glucose level;Carbohydrate counting;Food label reading, portion sizes and measuring food.    Physical activity and exercise  Role of exercise on diabetes management, blood pressure control and cardiac health.;Helped patient identify appropriate exercises in relation to his/her diabetes, diabetes complications and other health issue.    Medications  Reviewed patients medication for diabetes, action, purpose, timing of dose and side effects.    Monitoring  Purpose and frequency of SMBG.;Taught/discussed recording of test results and interpretation of SMBG.;Identified appropriate SMBG and/or A1C goals.;Yearly dilated eye exam    Chronic complications  Relationship between chronic complications and blood glucose control;Dental care;Lipid levels, blood glucose control and heart disease;Retinopathy and reason for yearly dilated eye exams;Reviewed with patient heart disease,  higher risk of, and prevention;Nephropathy, what it is, prevention of, the use of ACE, ARB's and early detection of through urine microalbumia.    Personal strategies to promote health  Lifestyle issues that need to be addressed for better diabetes care;Helped patient develop diabetes management plan for (enter comment)      Outcomes   Expected Outcomes  Demonstrated interest in learning. Expect positive outcomes       Individualized Plan for Diabetes Self-Management Training:   Learning Objective:  Patient will have a greater understanding of diabetes self-management. Patient education plan is to attend individual and/or group sessions per assessed needs and concerns.   Plan:   Patient Instructions   Check blood sugars 2 x day before breakfast and 2 hrs after supper every day and record  Bring blood sugar records to the next appointment/class  Exercise:  Walk  for  15-30    minutes   3  days a week  Eat 3 meals day,   1 snacks a day at bedtime if eating early supper and in afternoon if eating late supper  Space meals 4-6 hours apart  Avoid sugar sweetened drinks (soda, tea, coffee, sports drinks, juices)  Limit intake of sweets and desserts and fried foods  Take Linagliptin/Metformin with breakfast and supper  Return for appointment/classes on:  05-01-17   Expected Outcomes:  Demonstrated interest in learning. Expect positive outcomes  Education material provided: General meal planning guidelines  If problems or questions, patient to contact team via:  [phone]  Future DSME appointment:  05-01-17

## 2017-04-24 NOTE — Patient Instructions (Signed)
  Check blood sugars 2 x day before breakfast and 2 hrs after supper every day and record  Bring blood sugar records to the next appointment/class  Exercise:  Walk  for  15-30    minutes   3  days a week  Eat 3 meals day,   1 snacks a day at bedtime if eating early supper and in afternoon if eating late supper  Space meals 4-6 hours apart  Avoid sugar sweetened drinks (soda, tea, coffee, sports drinks, juices)  Limit intake of sweets and desserts and fried foods  Get a Sharps container  Take Linagliptin/Metformin with breakfast and supper  Return for appointment/classes on:

## 2017-05-01 ENCOUNTER — Encounter: Payer: Self-pay | Admitting: Dietician

## 2017-05-01 NOTE — Progress Notes (Signed)
Patient cancelled attendance at class today due to illness; he rescheduled his classes to begin 05/22/17.

## 2017-05-05 ENCOUNTER — Ambulatory Visit: Payer: 59 | Admitting: Dietician

## 2017-05-09 ENCOUNTER — Ambulatory Visit: Payer: 59 | Admitting: Family Medicine

## 2017-05-09 ENCOUNTER — Encounter: Payer: Self-pay | Admitting: Family Medicine

## 2017-05-09 VITALS — BP 148/82 | HR 60 | Ht 68.0 in | Wt 229.9 lb

## 2017-05-09 DIAGNOSIS — E669 Obesity, unspecified: Secondary | ICD-10-CM

## 2017-05-09 DIAGNOSIS — E119 Type 2 diabetes mellitus without complications: Secondary | ICD-10-CM | POA: Diagnosis not present

## 2017-05-09 DIAGNOSIS — E1159 Type 2 diabetes mellitus with other circulatory complications: Secondary | ICD-10-CM

## 2017-05-09 DIAGNOSIS — E785 Hyperlipidemia, unspecified: Secondary | ICD-10-CM

## 2017-05-09 DIAGNOSIS — E1169 Type 2 diabetes mellitus with other specified complication: Secondary | ICD-10-CM | POA: Diagnosis not present

## 2017-05-09 DIAGNOSIS — I152 Hypertension secondary to endocrine disorders: Secondary | ICD-10-CM

## 2017-05-09 DIAGNOSIS — Z723 Lack of physical exercise: Secondary | ICD-10-CM

## 2017-05-09 DIAGNOSIS — I10 Essential (primary) hypertension: Secondary | ICD-10-CM | POA: Diagnosis not present

## 2017-05-09 LAB — POCT UA - MICROALBUMIN
Albumin/Creatinine Ratio, Urine, POC: <30
Creatinine, POC: 100 mg/dL
Microalbumin Ur, POC: 30 mg/L

## 2017-05-09 LAB — POCT GLYCOSYLATED HEMOGLOBIN (HGB A1C): Hemoglobin A1C: 7.2

## 2017-05-09 MED ORDER — LOSARTAN POTASSIUM-HCTZ 100-12.5 MG PO TABS: 1.0000 | ORAL_TABLET | Freq: Every day | ORAL | 3 refills | Status: DC

## 2017-05-09 NOTE — Progress Notes (Signed)
Impression and Recommendations:    1. Diabetes mellitus without complication ([place])   2. Hypertension associated with diabetes ([place])   3. Hyperlipidemia associated with type 2 diabetes mellitus ([name])   4. Obesity, Class I, BMI 30-34.9   5. Inactivity    -DC hydrochlorothiazide  -Add losartan-hydrochlorothiazide. Next office visit we will recheck a BMP    Education and routine counseling performed. Handouts provided.  Orders Placed This Encounter  Procedures  . POCT glycosylated hemoglobin (Hb A1C)  . POCT UA - Microalbumin     Return in about 3 months (around 08/09/2017) for Let me know sooner than planned if you are having concerns.  Come in fasting.   The patient was counseled, risk factors were discussed, anticipatory guidance given.  Gross side effects, risk and benefits, and alternatives of medications discussed with patient.  Patient is aware that all medications have potential side effects and we are unable to predict every side effect or drug-drug interaction that may occur.  Expresses verbal understanding and consents to current therapy plan and treatment regimen.  Please see AVS handed out to patient at the end of our visit for further patient instructions/ counseling done pertaining to today's office visit.    Note: This document was prepared using Dragon voice recognition software and may include unintentional dictation errors.     Subjective:    Chief Complaint  Patient presents with  . Follow-up     Russell Robles is a 56 y.o. male who presents to [facility] today for Diabetes Management.     1. HTN HPI:  -  His blood pressure - not checking at home  - Patient reports good compliance with blood pressure medications  - Denies medication S-E   - Smoking Status noted   - He denies new onset of: chest pain, exercise intolerance, shortness of breath, dizziness, visual changes, headache, lower extremity swelling or  claudication.   Today their BP is BP: (!) 148/82   Last 3 blood pressure readings in our office are as follows: BP Readings from Last 3 Encounters:  08/01/17 124/78  06/01/17 138/90  05/09/17 (!) 148/82     Filed Weights   05/09/17 1517  Weight: 229 lb 14.4 oz (104.3 kg)       DM HPI:  For the past 3 weeks patient's fasting blood sugar been in the 160s-240 range.  He is not sure why.  He is still taking the same medications and we did not change the dose from prior.  - walking now 1.59miles/day, 15 min 3 times /wk --> plans to go up to 30 min 5-7 d/wk  - Pt went to nutritionist recently.  Set him up for 3 classes.    - Pt felt it was was beneficial and gave him a better indication what to eat and not to eat and gave reinforcement of what we discussed prior.  A1c today is 7.2 up a little bit from prior at 7.0 ( -A1c was done on 8, 25, 18 and was 7.0.  Prior to that it was 7.4 and 6 months prior was 6.7.  09/23/2015 A1c was 13.1.)    -  He has been working on diet and exercise for diabetes  Pt is currently maintained on the following medications for diabetes:   see med list today    Denies polyuria/polydipsia. Denies hypo/ hyperglycemia symptoms - He denies new onset of: chest pain, exercise intolerance, shortness of breath, dizziness, visual  changes, headache, lower extremity swelling or claudication.   Last diabetic eye exam was No results found for: HMDIABEYEEXA  Foot exam- UTD  Last A1C in the office was:  Lab Results  Component Value Date   HGBA1C 7.2 05/09/2017   HGBA1C 7.0 02/11/2017   HGBA1C 7.4 10/22/2016    Lab Results  Component Value Date   MICROALBUR 30 05/09/2017   LDLCALC 83 02/11/2017   CREATININE 0.98 05/26/2017      Last 3 blood pressure readings in our office are as follows: BP Readings from Last 3 Encounters:  08/01/17 124/78  06/01/17 138/90  05/09/17 (!) 148/82    BMI Readings from Last 3 Encounters:  08/01/17 35.32 kg/m    06/01/17 34.21 kg/m  05/09/17 34.96 kg/m     No problems updated.    Patient Care Team    Relationship Specialty Notifications Start End  Mellody Dance, DO PCP - General Family Medicine  03/01/17      Patient Active Problem List   Diagnosis Date Noted  . Diabetes mellitus without complication ([place]) 23/76/2831    Priority: High  . Hypertension associated with diabetes ([place]) 06/18/2015    Priority: High  . Hyperlipidemia associated with type 2 diabetes mellitus ([place]) 06/18/2015    Priority: High  . Obesity, Class I, BMI 30-34.9 04/12/2017    Priority: Medium  . Inactivity 04/12/2017    Priority: Low  . Age-related nuclear cataract of both eyes 06/18/2015    Priority: Low  . Inguinal hernia unilateral, non-recurrent 12/13/2010     Past Medical History:  Diagnosis Date  . Cough    PER PT NON-PRODUCTIVE , NO FEVER OR CONGESTION  . Hernia, inguinal, right   . Hyperlipidemia   . Hypertension   . Type 2 diabetes mellitus ([place])    last  HbA1c 7.2 on 05-09-2017 in epic     Past Surgical History:  Procedure Laterality Date  . INGUINAL HERNIA REPAIR Left 12-16-2010  dr Donne Hazel  [facility]  . INGUINAL HERNIA REPAIR Right 06/01/2017   Procedure: OPEN RIGHT INGUINAL HERNIA REPAIR WITH MESH;  Surgeon: Kinsinger, Arta Bruce, MD;  Location: [facility];  Service: General;  Laterality: Right;  GENERAL AND TAP BLOCK  . INSERTION OF MESH Right 06/01/2017   Procedure: INSERTION OF MESH;  Surgeon: Kinsinger, Arta Bruce, MD;  Location: [facility];  Service: General;  Laterality: Right;  GENERAL AND TAP BLOCK     Family History  Problem Relation Age of Onset  . COPD Mother   . Aortic aneurysm Father   . Lung cancer Sister      Social History   Substance and Sexual Activity  Drug Use No  ,  Social History   Substance and Sexual Activity  Alcohol Use No  . Frequency: Never  ,  Social History   Tobacco Use  Smoking Status Never Smoker   Smokeless Tobacco Never Used  ,    Current Outpatient Medications on File Prior to Visit  Medication Sig Dispense Refill  . Linagliptin-Metformin HCl (JENTADUETO) 2.10-998 MG TABS Take 1 tablet by mouth 2 (two) times daily.     . metoprolol succinate (TOPROL-XL) 50 MG 24 hr tablet Take 1 tablet by mouth every morning.     . simvastatin (ZOCOR) 80 MG tablet Take 80 mg by mouth at bedtime.      No current facility-administered medications on file prior to visit.      No Known Allergies   Review of Systems:  General:  Denies fever, chills Optho/Auditory:   Denies visual changes, blurred vision Respiratory:   Denies SOB, cough, wheeze, DIB  Cardiovascular:   Denies chest pain, palpitations, painful respirations Gastrointestinal:   Denies nausea, vomiting, diarrhea.  Endocrine:     Denies new hot or cold intolerance Musculoskeletal:  Denies joint swelling, gait issues, or new unexplained myalgias/ arthralgias Skin:  Denies rash, suspicious lesions  Neurological:    Denies dizziness, unexplained weakness, numbness  Psychiatric/Behavioral:   Denies mood changes    Objective:     Blood pressure (!) 148/82, pulse 60, height 5\' 8"  (1.727 m), weight 229 lb 14.4 oz (104.3 kg).  Body mass index is 34.96 kg/m.  General: Well Developed, well nourished, and in no acute distress.  HEENT: Normocephalic, atraumatic, pupils equal round reactive to light, neck supple, No carotid bruits, no JVD Skin: Warm and dry, cap RF less 2 sec Cardiac: Regular rate and rhythm, S1, S2 WNL's, no murmurs rubs or gallops Respiratory: ECTA B/L, Not using accessory muscles, speaking in full sentences. NeuroM-Sk: Ambulates w/o assistance, moves ext * 4 w/o difficulty, sensation grossly intact.  Ext: scant edema b/l lower ext Psych: No HI/SI, judgement and insight good, Euthymic mood. Full Affect.

## 2017-05-09 NOTE — Patient Instructions (Addendum)
-You are still on 2 blood pressure medicines but 1 of them we discontinued the hydrochlorothiazide and gave you a combination tablet.  Please be checking your blood pressures at home as you were on a new medicine for blood pressure.  Goal will be as close to 120/80 or less as possible.  If you are having any side effects prior to next appointment please let me know.  Also, since her blood sugars have been up lately above goal, please let me know if this continues and does not improve with your very positive lifestyle changes and increase activity levels.  We might want to make a change to your diabetes meds if this continues to be the trend.      https://[email or web address].fr  Measuring carbohydrate effects can help glucose management glycemix load   The glycemic index is a value assigned to foods based on how slowly or how quickly those foods cause increases in blood glucose levels. Also known as "blood sugar," blood glucose levels above normal are toxic and can cause blindness, kidney failure, or increase cardiovascular risk. Foods low on the glycemic index (GI) scale tend to release glucose slowly and steadily. Foods high on the glycemic index release glucose rapidly. Low GI foods tend to foster weight loss, while foods high on the GI scale help with energy recovery after exercise, or to offset hypo- (or insufficient) glycemia. Long-distance runners would tend to favor foods high on the glycemic index, while people with pre- or full-blown diabetes would need to concentrate on low GI foods. Why? People with type 1 diabetes and even some with type 2 can't produce sufficient quantities of insulin-which helps process blood sugar-which means they are likely to have an excess of blood glucose. The slow and steady release of glucose in low-glycemic foods is helpful in keeping blood glucose under control.  To help you understand how the  foods you are eating might impact your blood glucose level, here is an abbreviated chart of the glycemic index for more than 60 common foods. A more complete glycemix index chart can be found in the link below.  FOOD Glycemic index (glucose = 100) HIGH-CARBOHYDRATE FOODS  White wheat bread* 75  2 Whole wheat/whole meal bread 74  2 Specialty grain bread 53  2 Unleavened wheat bread 70  5 Wheat roti 62  3 Chapatti 52  4 Corn tortilla 46  4 White rice, boiled* 73  4 Brown rice, boiled 68  4 Barley 28  2 Sweet corn 52  5 Spaghetti, white 49  2 Spaghetti, whole meal 48  5 Rice noodles? 53  7 Udon noodles 55  7 Couscous? 65  4 BREAKFAST CEREALS  Cornflakes 81  6 Wheat flake biscuits 69  2 Porridge, rolled oats 55  2 Instant oat porridge 79  3 Rice porridge/congee 78  9 Millet porridge 67  5 Muesli 57  2 FRUIT AND FRUIT PRODUCTS  Apple, raw? 36  2 Orange, raw? 43  3 Banana, raw? 51  3 Pineapple, raw 59  8 Mango, raw? 51  5 Watermelon, raw 76  4 Dates, raw 42  4 Peaches, canned? 43  5 Strawberry jam/jelly 49  3 Apple juice 41  2 Orange juice 50  2 VEGETABLES  Potato, boiled 78  4 Potato, instant mash 87  3 Potato, french fries 63  5 Carrots, boiled 39  4 Sweet potato, boiled 63  6 Pumpkin, boiled 64  7 Plantain/green banana 55  6 Taro, boiled 53  2  Vegetable soup 48  5 DAIRY PRODUCTS AND ALTERNATIVES  Milk, full fat 39  3 Milk, skim 37  4 Ice cream 51  3 Yogurt, fruit 41  2 Soy milk 34  4 Rice milk 86  7 LEGUMES  [place]ickpeas 28  9 Kidney beans 24  4 Lentils 32  5 Soya beans 16  1 SNACK PRODUCTS  Chocolate 40  3 Popcorn 65  5 Potato crisps 56  3 Soft drink/soda 59  3 Rice crackers/crisps 87  2 SUGARS  Fructose 15  4 Sucrose 65  4 Glucose 103  3 Honey 61  3 Data are means  SEM.  * Low-GI varieties were also identified.  ? Average of all available data.  The complete list of the glycemic  index and glycemic load for more than 1,000 foods can be found in the article "International tables of glycemic index and glycemic load values: 2008" by Illa Level. Candis Musa Foster-Powell, and Lesleigh Noe in the December 2008 issue of Diabetes Care, Vol. 31, number 12, pages 2281-2283.  To get the lowdown on glycemic index and glycemic load, read more about it here.  American Diabetes Association, 2008. Copyright and all rights reserved. This chart has been used with the permission of American Diabetes Association.

## 2017-05-22 ENCOUNTER — Encounter: Payer: 59 | Attending: Family Medicine

## 2017-05-22 DIAGNOSIS — I10 Essential (primary) hypertension: Secondary | ICD-10-CM | POA: Insufficient documentation

## 2017-05-22 DIAGNOSIS — E1159 Type 2 diabetes mellitus with other circulatory complications: Secondary | ICD-10-CM | POA: Insufficient documentation

## 2017-05-22 DIAGNOSIS — E1169 Type 2 diabetes mellitus with other specified complication: Secondary | ICD-10-CM | POA: Insufficient documentation

## 2017-05-22 DIAGNOSIS — E785 Hyperlipidemia, unspecified: Secondary | ICD-10-CM | POA: Insufficient documentation

## 2017-05-26 ENCOUNTER — Encounter (HOSPITAL_COMMUNITY)
Admission: RE | Admit: 2017-05-26 | Discharge: 2017-05-26 | Disposition: A | Discharge status: Home / Self Care | Payer: 59 | Source: Ambulatory Visit | Attending: General Surgery | Admitting: General Surgery

## 2017-05-26 ENCOUNTER — Other Ambulatory Visit: Payer: Self-pay

## 2017-05-26 ENCOUNTER — Encounter (HOSPITAL_BASED_OUTPATIENT_CLINIC_OR_DEPARTMENT_OTHER): Payer: Self-pay | Admitting: *Deleted

## 2017-05-26 DIAGNOSIS — E119 Type 2 diabetes mellitus without complications: Secondary | ICD-10-CM | POA: Diagnosis not present

## 2017-05-26 DIAGNOSIS — E785 Hyperlipidemia, unspecified: Secondary | ICD-10-CM | POA: Diagnosis not present

## 2017-05-26 DIAGNOSIS — Z79899 Other long term (current) drug therapy: Secondary | ICD-10-CM | POA: Diagnosis not present

## 2017-05-26 DIAGNOSIS — K4090 Unilateral inguinal hernia, without obstruction or gangrene, not specified as recurrent: Secondary | ICD-10-CM | POA: Diagnosis not present

## 2017-05-26 DIAGNOSIS — I10 Essential (primary) hypertension: Secondary | ICD-10-CM | POA: Diagnosis not present

## 2017-05-26 LAB — CBC WITH DIFFERENTIAL/PLATELET
Basophils Absolute: 0.1 10*3/uL (ref 0.0–0.1)
Basophils Relative: 0 %
Eosinophils Absolute: 0.4 10*3/uL (ref 0.0–0.7)
Eosinophils Relative: 3 %
HCT: 42.1 % (ref 39.0–52.0)
Hemoglobin: 14.9 g/dL (ref 13.0–17.0)
Lymphocytes Relative: 26 %
Lymphs Abs: 3.5 10*3/uL (ref 0.7–4.0)
MCH: 29.6 pg (ref 26.0–34.0)
MCHC: 35.4 g/dL (ref 30.0–36.0)
MCV: 83.7 fL (ref 78.0–100.0)
Monocytes Absolute: 1 10*3/uL (ref 0.1–1.0)
Monocytes Relative: 8 %
Neutro Abs: 8.4 10*3/uL — ABNORMAL HIGH (ref 1.7–7.7)
Neutrophils Relative %: 63 %
Platelets: 260 10*3/uL (ref 150–400)
RBC: 5.03 MIL/uL (ref 4.22–5.81)
RDW: 12.3 % (ref 11.5–15.5)
WBC: 13.4 10*3/uL — ABNORMAL HIGH (ref 4.0–10.5)

## 2017-05-26 LAB — BASIC METABOLIC PANEL
Anion gap: 8 (ref 5–15)
BUN: 17 mg/dL (ref 6–20)
CO2: 28 mmol/L (ref 22–32)
Calcium: 9.4 mg/dL (ref 8.9–10.3)
Chloride: 102 mmol/L (ref 101–111)
Creatinine, Ser: 0.98 mg/dL (ref 0.61–1.24)
GFR calc Af Amer: >60 mL/min (ref >60)
GFR calc non Af Amer: >60 mL/min (ref >60)
Glucose, Bld: 166 mg/dL — ABNORMAL HIGH (ref 65–99)
Potassium: 3.6 mmol/L (ref 3.5–5.1)
Sodium: 138 mmol/L (ref 135–145)

## 2017-05-26 NOTE — Progress Notes (Signed)
SPOKE W/ PT VIA PHONE FOR PRE-OP INTERVIEW.  NPO AFTER MN.  ARRIVE AT 0830.  NEEDS EKG.  GETTING LAB WORK DONE TODAY (CBCdiff,BMET).  WILL TAKE METOPROLOL AM DOS W/ SIPS OF WATER.  PT VERBALIZED UNDERSTANDING TO DO HIBICLENS SHOWER HS BEFORE AND AM DOS.

## 2017-05-27 ENCOUNTER — Telehealth: Payer: Self-pay | Admitting: Surgery

## 2017-05-27 NOTE — Telephone Encounter (Signed)
Russell Robles  03-Apr-1961 [id number]  Patient Care Team: Mellody Dance, DO as PCP - General (Family Medicine)  This patient is a 56 y.o.male who calls today for surgical evaluation.   Reason for call: Chronic cough.  Sore throat.  Patient due for elective hernia repair next week by Dr. Kieth Brightly.  Was told by preoperative interviewing nurse to try and get cough under control since it has been for several weeks and may have to postpone the case.  He called wondering what to do.  He did not sound hoarse.  He does not smoke.  Denies major drainage.  I recommend he run by his primary care physician and see if further workup or intervention is needed since it has persisted for several weeks  Patient Active Problem List   Diagnosis Date Noted  . Obesity, Class I, BMI 30-34.9 04/12/2017  . Inactivity 04/12/2017  . Age-related nuclear cataract of both eyes 06/18/2015  . Diabetes mellitus without complication ([place]) 33/82/5053  . Hypertension associated with diabetes ([place]) 06/18/2015  . Hyperlipidemia associated with type 2 diabetes mellitus ([place]) 06/18/2015  . Inguinal hernia unilateral, non-recurrent 12/13/2010    Past Medical History:  Diagnosis Date  . Cough    PER PT NON-PRODUCTIVE , NO FEVER OR CONGESTION  . Hernia, inguinal, right   . Hyperlipidemia   . Hypertension   . Type 2 diabetes mellitus ([place])    last  HbA1c 7.2 on 05-09-2017 in epic    Past Surgical History:  Procedure Laterality Date  . INGUINAL HERNIA REPAIR Left 12-16-2010  dr Donne Hazel  [facility]    Social History   Socioeconomic History  . Marital status: Married    Spouse name: Not on file  . Number of children: Not on file  . Years of education: Not on file  . Highest education level: Not on file  Social Needs  . Financial resource strain: Not on file  . Food insecurity - worry: Not on file  . Food insecurity - inability: Not on file  . Transportation needs - medical: Not on file  . Transportation needs -  non-medical: Not on file  Occupational History  . Not on file  Tobacco Use  . Smoking status: Never Smoker  . Smokeless tobacco: Never Used  Substance and Sexual Activity  . Alcohol use: No    Frequency: Never  . Drug use: No  . Sexual activity: Yes    Partners: Female    Birth control/protection: None  Other Topics Concern  . Not on file  Social History Narrative  . Not on file    Family History  Problem Relation Age of Onset  . COPD Mother   . Aortic aneurysm Father   . Lung cancer Sister     Current Outpatient Medications  Medication Sig Dispense Refill  . Linagliptin-Metformin HCl (JENTADUETO) 2.10-998 MG TABS Take 1 tablet by mouth 2 (two) times daily.     Marland Kitchen losartan-hydrochlorothiazide (HYZAAR) 100-12.5 MG tablet Take 1 tablet by mouth daily. (Patient taking differently: Take 1 tablet by mouth every morning. ) 90 tablet 3  . metoprolol succinate (TOPROL-XL) 50 MG 24 hr tablet Take 1 tablet by mouth every morning.     . simvastatin (ZOCOR) 80 MG tablet Take 80 mg by mouth at bedtime.      No current facility-administered medications for this visit.      No Known Allergies  @VS @  No results found.  Note: This dictation was prepared with Dragon/digital dictation along with Smartphrase  technology. Any transcriptional errors that result from this process are unintentional.   .Adin Hector, M.D., F.A.C.S. Gastrointestinal and Minimally Invasive Surgery [facility] [address] [place], [place] [address] [phone] Main / Paging  05/27/2017 11:21 AM

## 2017-06-01 ENCOUNTER — Other Ambulatory Visit: Payer: Self-pay

## 2017-06-01 ENCOUNTER — Ambulatory Visit (HOSPITAL_BASED_OUTPATIENT_CLINIC_OR_DEPARTMENT_OTHER): Payer: 59 | Admitting: Anesthesiology

## 2017-06-01 ENCOUNTER — Encounter (HOSPITAL_BASED_OUTPATIENT_CLINIC_OR_DEPARTMENT_OTHER)
Admission: RE | Disposition: A | Discharge status: Home / Self Care | Payer: Self-pay | Source: Ambulatory Visit | Attending: General Surgery

## 2017-06-01 ENCOUNTER — Encounter (HOSPITAL_BASED_OUTPATIENT_CLINIC_OR_DEPARTMENT_OTHER): Payer: Self-pay | Admitting: *Deleted

## 2017-06-01 ENCOUNTER — Ambulatory Visit (HOSPITAL_BASED_OUTPATIENT_CLINIC_OR_DEPARTMENT_OTHER)
Admission: RE | Admit: 2017-06-01 | Discharge: 2017-06-01 | Disposition: A | Discharge status: Home / Self Care | Payer: 59 | Source: Ambulatory Visit | Attending: General Surgery | Admitting: General Surgery

## 2017-06-01 DIAGNOSIS — Z79899 Other long term (current) drug therapy: Secondary | ICD-10-CM | POA: Insufficient documentation

## 2017-06-01 DIAGNOSIS — I10 Essential (primary) hypertension: Secondary | ICD-10-CM | POA: Diagnosis not present

## 2017-06-01 DIAGNOSIS — E119 Type 2 diabetes mellitus without complications: Secondary | ICD-10-CM | POA: Diagnosis not present

## 2017-06-01 DIAGNOSIS — K4090 Unilateral inguinal hernia, without obstruction or gangrene, not specified as recurrent: Secondary | ICD-10-CM | POA: Insufficient documentation

## 2017-06-01 DIAGNOSIS — E785 Hyperlipidemia, unspecified: Secondary | ICD-10-CM | POA: Diagnosis not present

## 2017-06-01 HISTORY — PX: INGUINAL HERNIA REPAIR: SHX194

## 2017-06-01 HISTORY — DX: Cough: R05

## 2017-06-01 HISTORY — PX: INSERTION OF MESH: SHX5868

## 2017-06-01 HISTORY — DX: Type 2 diabetes mellitus without complications: E11.9

## 2017-06-01 HISTORY — DX: Cough, unspecified: R05.9

## 2017-06-01 LAB — GLUCOSE, CAPILLARY
Glucose-Capillary: 181 mg/dL — ABNORMAL HIGH (ref 65–99)
Glucose-Capillary: 283 mg/dL — ABNORMAL HIGH (ref 65–99)

## 2017-06-01 SURGERY — REPAIR, HERNIA, INGUINAL, ADULT
Anesthesia: Regional | Site: Inguinal | Laterality: Right

## 2017-06-01 MED ORDER — FENTANYL CITRATE (PF) 100 MCG/2ML IJ SOLN
INTRAMUSCULAR | Status: AC
  Filled 2017-06-01: qty 2

## 2017-06-01 MED ORDER — CELECOXIB 200 MG PO CAPS
200.0000 mg | ORAL_CAPSULE | ORAL | Status: AC
  Administered 2017-06-01: 200 mg via ORAL
  Filled 2017-06-01: qty 1

## 2017-06-01 MED ORDER — SUGAMMADEX SODIUM 200 MG/2ML IV SOLN
INTRAVENOUS | Status: AC
  Filled 2017-06-01: qty 2

## 2017-06-01 MED ORDER — FENTANYL CITRATE (PF) 100 MCG/2ML IJ SOLN
100.0000 ug | Freq: Once | INTRAMUSCULAR | Status: AC
  Administered 2017-06-01: 100 ug via INTRAVENOUS
  Filled 2017-06-01: qty 2

## 2017-06-01 MED ORDER — BUPIVACAINE-EPINEPHRINE (PF) 0.5% -1:200000 IJ SOLN
INTRAMUSCULAR | Status: DC | PRN
  Administered 2017-06-01: 30 mL via PERINEURAL

## 2017-06-01 MED ORDER — FENTANYL CITRATE (PF) 100 MCG/2ML IJ SOLN
25.0000 ug | INTRAMUSCULAR | Status: DC | PRN
  Administered 2017-06-01: 50 ug via INTRAVENOUS
  Administered 2017-06-01 (×3): 25 ug via INTRAVENOUS
  Filled 2017-06-01: qty 1

## 2017-06-01 MED ORDER — MIDAZOLAM HCL 2 MG/2ML IJ SOLN
2.0000 mg | Freq: Once | INTRAMUSCULAR | Status: AC
  Administered 2017-06-01: 2 mg via INTRAVENOUS
  Filled 2017-06-01: qty 2

## 2017-06-01 MED ORDER — LIDOCAINE 2% (20 MG/ML) 5 ML SYRINGE
INTRAMUSCULAR | Status: DC | PRN
  Administered 2017-06-01: 60 mg via INTRAVENOUS

## 2017-06-01 MED ORDER — ACETAMINOPHEN 500 MG PO TABS
ORAL_TABLET | ORAL | Status: AC
  Filled 2017-06-01: qty 2

## 2017-06-01 MED ORDER — PROPOFOL 10 MG/ML IV BOLUS
INTRAVENOUS | Status: DC | PRN
  Administered 2017-06-01: 200 mg via INTRAVENOUS

## 2017-06-01 MED ORDER — ONDANSETRON HCL 4 MG/2ML IJ SOLN
4.0000 mg | Freq: Four times a day (QID) | INTRAMUSCULAR | Status: DC | PRN
  Filled 2017-06-01: qty 2

## 2017-06-01 MED ORDER — IBUPROFEN 800 MG PO TABS: 800.0000 mg | ORAL_TABLET | Freq: Three times a day (TID) | ORAL | 0 refills | Status: DC | PRN

## 2017-06-01 MED ORDER — SODIUM CHLORIDE 0.9 % IR SOLN
Status: DC | PRN
  Administered 2017-06-01: 500 mL

## 2017-06-01 MED ORDER — DEXAMETHASONE SODIUM PHOSPHATE 10 MG/ML IJ SOLN
INTRAMUSCULAR | Status: AC
  Filled 2017-06-01: qty 1

## 2017-06-01 MED ORDER — ONDANSETRON HCL 4 MG/2ML IJ SOLN
INTRAMUSCULAR | Status: AC
  Filled 2017-06-01: qty 2

## 2017-06-01 MED ORDER — OXYCODONE HCL 5 MG PO TABS
ORAL_TABLET | ORAL | Status: AC
  Filled 2017-06-01: qty 1

## 2017-06-01 MED ORDER — GABAPENTIN 300 MG PO CAPS
300.0000 mg | ORAL_CAPSULE | ORAL | Status: AC
  Administered 2017-06-01: 300 mg via ORAL
  Filled 2017-06-01: qty 1

## 2017-06-01 MED ORDER — CEFAZOLIN SODIUM-DEXTROSE 2-4 GM/100ML-% IV SOLN
INTRAVENOUS | Status: AC
  Filled 2017-06-01: qty 100

## 2017-06-01 MED ORDER — CHLORHEXIDINE GLUCONATE CLOTH 2 % EX PADS
6.0000 | MEDICATED_PAD | Freq: Once | CUTANEOUS | Status: DC
  Filled 2017-06-01: qty 6

## 2017-06-01 MED ORDER — FENTANYL CITRATE (PF) 100 MCG/2ML IJ SOLN
INTRAMUSCULAR | Status: DC | PRN
  Administered 2017-06-01 (×2): 50 ug via INTRAVENOUS

## 2017-06-01 MED ORDER — PROPOFOL 10 MG/ML IV BOLUS
INTRAVENOUS | Status: AC
  Filled 2017-06-01: qty 20

## 2017-06-01 MED ORDER — ROCURONIUM BROMIDE 50 MG/5ML IV SOSY
PREFILLED_SYRINGE | INTRAVENOUS | Status: AC
  Filled 2017-06-01: qty 5

## 2017-06-01 MED ORDER — SUGAMMADEX SODIUM 200 MG/2ML IV SOLN
INTRAVENOUS | Status: DC | PRN
  Administered 2017-06-01: 200 mg via INTRAVENOUS

## 2017-06-01 MED ORDER — ROCURONIUM BROMIDE 10 MG/ML (PF) SYRINGE
PREFILLED_SYRINGE | INTRAVENOUS | Status: DC | PRN
  Administered 2017-06-01 (×2): 10 mg via INTRAVENOUS
  Administered 2017-06-01: 40 mg via INTRAVENOUS
  Administered 2017-06-01: 10 mg via INTRAVENOUS

## 2017-06-01 MED ORDER — LACTATED RINGERS IV SOLN
INTRAVENOUS | Status: DC
  Administered 2017-06-01 (×2): via INTRAVENOUS
  Filled 2017-06-01: qty 1000

## 2017-06-01 MED ORDER — CELECOXIB 200 MG PO CAPS
ORAL_CAPSULE | ORAL | Status: AC
  Filled 2017-06-01: qty 1

## 2017-06-01 MED ORDER — ACETAMINOPHEN 500 MG PO TABS
1000.0000 mg | ORAL_TABLET | ORAL | Status: AC
  Administered 2017-06-01: 1000 mg via ORAL
  Filled 2017-06-01: qty 2

## 2017-06-01 MED ORDER — GABAPENTIN 300 MG PO CAPS
ORAL_CAPSULE | ORAL | Status: AC
  Filled 2017-06-01: qty 1

## 2017-06-01 MED ORDER — OXYCODONE HCL 5 MG/5ML PO SOLN
5.0000 mg | Freq: Once | ORAL | Status: AC | PRN
  Filled 2017-06-01: qty 5

## 2017-06-01 MED ORDER — CEFAZOLIN SODIUM-DEXTROSE 2-4 GM/100ML-% IV SOLN
2.0000 g | INTRAVENOUS | Status: AC
  Administered 2017-06-01: 2 g via INTRAVENOUS
  Filled 2017-06-01: qty 100

## 2017-06-01 MED ORDER — OXYCODONE HCL 5 MG PO TABS
5.0000 mg | ORAL_TABLET | Freq: Once | ORAL | Status: AC | PRN
  Administered 2017-06-01: 5 mg via ORAL
  Filled 2017-06-01: qty 1

## 2017-06-01 MED ORDER — LIDOCAINE 2% (20 MG/ML) 5 ML SYRINGE
INTRAMUSCULAR | Status: AC
  Filled 2017-06-01: qty 5

## 2017-06-01 MED ORDER — MIDAZOLAM HCL 2 MG/2ML IJ SOLN
INTRAMUSCULAR | Status: AC
  Filled 2017-06-01: qty 2

## 2017-06-01 MED ORDER — DEXAMETHASONE SODIUM PHOSPHATE 10 MG/ML IJ SOLN
INTRAMUSCULAR | Status: DC | PRN
  Administered 2017-06-01: 10 mg via INTRAVENOUS

## 2017-06-01 MED ORDER — HYDROCODONE-ACETAMINOPHEN 5-325 MG PO TABS: 1.0000 | ORAL_TABLET | Freq: Four times a day (QID) | ORAL | 0 refills | Status: DC | PRN

## 2017-06-01 MED ORDER — ONDANSETRON HCL 4 MG/2ML IJ SOLN
INTRAMUSCULAR | Status: DC | PRN
  Administered 2017-06-01: 4 mg via INTRAVENOUS

## 2017-06-01 SURGICAL SUPPLY — 46 items
ADH SKN CLS APL DERMABOND .7 (GAUZE/BANDAGES/DRESSINGS) ×1
BLADE CLIPPER SENSICLIP SURGIC (BLADE) ×2 IMPLANT
BLADE HEX COATED 2.75 (ELECTRODE) ×2 IMPLANT
BLADE SURG 15 STRL LF DISP TIS (BLADE) ×1 IMPLANT
BLADE SURG 15 STRL SS (BLADE) ×2
CANISTER SUCT 3000ML PPV (MISCELLANEOUS) ×1 IMPLANT
CELLS DAT CNTRL 66122 CELL SVR (MISCELLANEOUS) ×1 IMPLANT
CHLORAPREP W/TINT 26ML (MISCELLANEOUS) ×2 IMPLANT
COVER BACK TABLE 60X90IN (DRAPES) ×2 IMPLANT
COVER MAYO STAND STRL (DRAPES) ×2 IMPLANT
DERMABOND ADVANCED (GAUZE/BANDAGES/DRESSINGS) ×1
DERMABOND ADVANCED .7 DNX12 (GAUZE/BANDAGES/DRESSINGS) ×1 IMPLANT
DRAIN PENROSE 18X1/4 LTX STRL (WOUND CARE) ×1 IMPLANT
DRAPE LAPAROSCOPIC ABDOMINAL (DRAPES) ×2 IMPLANT
DRAPE UTILITY XL STRL (DRAPES) ×2 IMPLANT
ELECT REM PT RETURN 9FT ADLT (ELECTROSURGICAL) ×2
ELECTRODE REM PT RTRN 9FT ADLT (ELECTROSURGICAL) ×1 IMPLANT
GLOVE BIOGEL PI IND STRL 7.0 (GLOVE) ×1 IMPLANT
GLOVE BIOGEL PI INDICATOR 7.0 (GLOVE) ×1
GLOVE SURG SS PI 7.0 STRL IVOR (GLOVE) ×2 IMPLANT
GOWN STRL REUS W/ TWL LRG LVL3 (GOWN DISPOSABLE) ×2 IMPLANT
GOWN STRL REUS W/TWL LRG LVL3 (GOWN DISPOSABLE) ×4
KIT RM TURNOVER CYSTO AR (KITS) ×2 IMPLANT
MESH BARD SOFT 3X6IN (Mesh General) ×1 IMPLANT
NDL HYPO 25X1 1.5 SAFETY (NEEDLE) ×1 IMPLANT
NEEDLE HYPO 25X1 1.5 SAFETY (NEEDLE) ×2 IMPLANT
NS IRRIG 500ML POUR BTL (IV SOLUTION) ×2 IMPLANT
PACK BASIN DAY SURGERY FS (CUSTOM PROCEDURE TRAY) ×2 IMPLANT
PAD ARMBOARD 7.5X6 YLW CONV (MISCELLANEOUS) ×2 IMPLANT
PENCIL BUTTON HOLSTER BLD 10FT (ELECTRODE) ×2 IMPLANT
RETRACTOR WND ALEXIS 18 MED (MISCELLANEOUS) IMPLANT
RTRCTR WOUND ALEXIS 18CM MED (MISCELLANEOUS) ×2
SPONGE LAP 4X18 X RAY DECT (DISPOSABLE) ×1 IMPLANT
SUT MNCRL AB 4-0 PS2 18 (SUTURE) ×2 IMPLANT
SUT PROLENE 2 0 CT2 30 (SUTURE) ×4 IMPLANT
SUT SILK 3 0 TIES 17X18 (SUTURE)
SUT SILK 3-0 18XBRD TIE BLK (SUTURE) IMPLANT
SUT VIC AB 2-0 SH 27 (SUTURE) ×4
SUT VIC AB 2-0 SH 27XBRD (SUTURE) ×1 IMPLANT
SUT VIC AB 3-0 SH 27 (SUTURE) ×4
SUT VIC AB 3-0 SH 27X BRD (SUTURE) ×1 IMPLANT
SYR BULB IRRIGATION 50ML (SYRINGE) ×2 IMPLANT
SYR CONTROL 10ML LL (SYRINGE) ×2 IMPLANT
TOWEL OR 17X24 6PK STRL BLUE (TOWEL DISPOSABLE) ×2 IMPLANT
TUBE CONNECTING 12X1/4 (SUCTIONS) ×2 IMPLANT
YANKAUER SUCT BULB TIP NO VENT (SUCTIONS) ×2 IMPLANT

## 2017-06-01 NOTE — Anesthesia Preprocedure Evaluation (Signed)
Anesthesia Evaluation  Patient identified by MRN, date of birth, ID band Patient awake    Reviewed: Allergy & Precautions, H&P , NPO status , Patient's Chart, lab work & pertinent test results  Airway Mallampati: II   Neck ROM: full    Dental   Pulmonary neg pulmonary ROS,    breath sounds clear to auscultation       Cardiovascular hypertension,  Rhythm:regular Rate:Normal     Neuro/Psych    GI/Hepatic   Endo/Other  diabetes, Type 2  Renal/GU      Musculoskeletal   Abdominal   Peds  Hematology   Anesthesia Other Findings   Reproductive/Obstetrics                             Anesthesia Physical Anesthesia Plan  ASA: II  Anesthesia Plan: General and Regional   Post-op Pain Management:  Regional for Post-op pain   Induction: Intravenous  PONV Risk Score and Plan: 2 and Ondansetron, Dexamethasone, Midazolam and Treatment may vary due to age or medical condition  Airway Management Planned: Oral ETT  Additional Equipment:   Intra-op Plan:   Post-operative Plan: Extubation in OR  Informed Consent: I have reviewed the patients History and Physical, chart, labs and discussed the procedure including the risks, benefits and alternatives for the proposed anesthesia with the patient or authorized representative who has indicated his/her understanding and acceptance.     Plan Discussed with: CRNA, Anesthesiologist and Surgeon  Anesthesia Plan Comments:         Anesthesia Quick Evaluation

## 2017-06-01 NOTE — Anesthesia Procedure Notes (Signed)
Procedure Name: Intubation Date/Time: 06/01/2017 10:44 AM Performed by: Suan Halter, CRNA Pre-anesthesia Checklist: Patient identified, Emergency Drugs available, Suction available and Patient being monitored Patient Re-evaluated:Patient Re-evaluated prior to induction Oxygen Delivery Method: Circle system utilized Preoxygenation: Pre-oxygenation with 100% oxygen Induction Type: IV induction Ventilation: Mask ventilation without difficulty Laryngoscope Size: Mac and 3 Grade View: Grade I Tube type: Oral Tube size: 7.5 mm Number of attempts: 1 Airway Equipment and Method: Stylet and Oral airway Placement Confirmation: ETT inserted through vocal cords under direct vision,  positive ETCO2 and breath sounds checked- equal and bilateral Secured at: 23 cm Tube secured with: Tape Dental Injury: Teeth and Oropharynx as per pre-operative assessment

## 2017-06-01 NOTE — Anesthesia Procedure Notes (Signed)
Anesthesia Regional Block: TAP block   Pre-Anesthetic Checklist: ,, timeout performed, Correct Patient, Correct Site, Correct Laterality, Correct Procedure, Correct Position, site marked, Risks and benefits discussed,  Surgical consent,  Pre-op evaluation,  At surgeon's request and post-op pain management  Laterality: Right  Prep: chloraprep       Needles:  Injection technique: Single-shot  Needle Type: Echogenic Needle     Needle Length: 9cm  Needle Gauge: 21     Additional Needles:   Narrative:  Start time: 06/01/2017 9:52 AM End time: 06/01/2017 9:56 AM Injection made incrementally with aspirations every 5 mL.  Performed by: Personally  Anesthesiologist: Albertha Ghee, MD  Additional Notes: Pt tolerated the procedure well.

## 2017-06-01 NOTE — H&P (Signed)
Russell Robles is an 56 y.o. male.   Chief Complaint: hernia HPI: 56 yo male with pain at right groin. He had a previous repair on the left side and now has a right inguinal hernia  Past Medical History:  Diagnosis Date  . Cough    PER PT NON-PRODUCTIVE , NO FEVER OR CONGESTION  . Hernia, inguinal, right   . Hyperlipidemia   . Hypertension   . Type 2 diabetes mellitus ([place])    last  HbA1c 7.2 on 05-09-2017 in epic    Past Surgical History:  Procedure Laterality Date  . INGUINAL HERNIA REPAIR Left 12-16-2010  dr Donne Hazel  [facility]    Family History  Problem Relation Age of Onset  . COPD Mother   . Aortic aneurysm Father   . Lung cancer Sister    Social History:  reports that  has never smoked. he has never used smokeless tobacco. He reports that he does not drink alcohol or use drugs.  Allergies: No Known Allergies  Medications Prior to Admission  Medication Sig Dispense Refill  . guaiFENesin-dextromethorphan (ROBITUSSIN DM) 100-10 MG/5ML syrup Take 5 mLs by mouth every 4 (four) hours as needed for cough.    . Linagliptin-Metformin HCl (JENTADUETO) 2.10-998 MG TABS Take 1 tablet by mouth 2 (two) times daily.     Marland Kitchen losartan-hydrochlorothiazide (HYZAAR) 100-12.5 MG tablet Take 1 tablet by mouth daily. (Patient taking differently: Take 1 tablet by mouth every morning. ) 90 tablet 3  . metoprolol succinate (TOPROL-XL) 50 MG 24 hr tablet Take 1 tablet by mouth every morning.     . simvastatin (ZOCOR) 80 MG tablet Take 80 mg by mouth at bedtime.       Results for orders placed or performed during the hospital encounter of 06/01/17 (from the past 48 hour(s))  Glucose, capillary     Status: Abnormal   Collection Time: 06/01/17  9:31 AM  Result Value Ref Range   Glucose-Capillary 181 (H) 65 - 99 mg/dL   No results found.  Review of Systems  Constitutional: Negative for chills and fever.  HENT: Negative for hearing loss.   Eyes: Negative for blurred vision and double vision.   Respiratory: Negative for cough and hemoptysis.   Cardiovascular: Negative for chest pain and palpitations.  Gastrointestinal: Negative for abdominal pain, nausea and vomiting.  Genitourinary: Negative for dysuria and urgency.  Musculoskeletal: Negative for myalgias and neck pain.  Skin: Negative for itching and rash.  Neurological: Negative for dizziness, tingling and headaches.  Endo/Heme/Allergies: Does not bruise/bleed easily.  Psychiatric/Behavioral: Negative for depression and suicidal ideas.    Blood pressure 120/84, pulse 68, temperature 98.1 F (36.7 C), resp. rate 19, height 5\' 8"  (1.727 m), weight 102.1 kg (225 lb), SpO2 99 %. Physical Exam  Vitals reviewed. Constitutional: He is oriented to person, place, and time. He appears well-developed and well-nourished.  HENT:  Head: Normocephalic and atraumatic.  Eyes: Conjunctivae and EOM are normal. Pupils are equal, round, and reactive to light.  Neck: Normal range of motion. Neck supple.  Cardiovascular: Normal rate and regular rhythm.  Respiratory: Effort normal and breath sounds normal.  GI: Soft. Bowel sounds are normal. He exhibits no distension. There is no tenderness.  Right inguinal hernia  Musculoskeletal: Normal range of motion.  Neurological: He is alert and oriented to person, place, and time.  Skin: Skin is warm and dry.  Psychiatric: He has a normal mood and affect. His behavior is normal.     Assessment/Plan 56 yo  male with right inguinal hernia -open right inguinal hernia -ERAS and [place] TAP -planned outpatient procedure  Mickeal Skinner, MD 06/01/2017, 10:23 AM

## 2017-06-01 NOTE — Transfer of Care (Signed)
Immediate Anesthesia Transfer of Care Note  Patient: Russell Robles  Procedure(s) Performed: OPEN RIGHT INGUINAL HERNIA REPAIR WITH MESH (Right Inguinal) INSERTION OF MESH (Right Inguinal)  Immediate Anesthesia Transfer of Care Note  Patient: Russell Robles  Procedure(s) Performed: Procedure(s) (LRB): OPEN RIGHT INGUINAL HERNIA REPAIR WITH MESH (Right) INSERTION OF MESH (Right)  Patient Location: PACU  Anesthesia Type: General  Level of Consciousness: awake, oriented, sedated and patient cooperative  Airway & Oxygen Therapy: Patient Spontanous Breathing and Patient connected to face mask oxygen  Post-op Assessment: Report given to PACU RN and Post -op Vital signs reviewed and stable  Post vital signs: Reviewed and stable  Complications: No apparent anesthesia complications  Last Vitals:  Vitals:   06/01/17 1007 06/01/17 1203  BP: 120/84 (!) 167/112  Pulse: 68 69  Resp: 19 19  Temp:  36.9 C  SpO2: 99% 94%    Last Pain: There were no vitals filed for this visit.    Patients Stated Pain Goal: 6 (06/01/17 0904)

## 2017-06-01 NOTE — Progress Notes (Signed)
Time out with Dr. Marcie Bal and Nurse at [phone]. [phone] versed 2mg  and Fent. 157mcg given for sedation for block. 1000 block completed tolerated well.

## 2017-06-01 NOTE — Discharge Instructions (Signed)

## 2017-06-01 NOTE — Op Note (Signed)
Preop diagnosis: right inguinal hernia  Postop diagnosis: right inguinal hernia  Procedure: open Right inguinal hernia repair with mesh  Surgeon: Gurney Maxin, M.D.  Asst: none  Anesthesia: Gen.   Indications for procedure: Russell Robles is a 56 y.o. male with symptoms of pain and enlarging Right inguinal hernia(s). After discussing risks, alternatives and benefits he decided on open repair and was brought to day surgery for repair.  Description of procedure: The patient was brought into the operative suite, placed supine. Anesthesia was administered with endotracheal tube. Patient was strapped in place. The patient was prepped and draped in the usual sterile fashion.  The anterior superior iliac spine and pubic tubercle were identified on the Right side. An incision was made 1cm above the connecting line, representative of the location of the inguinal ligament. The subcutaneous tissue was bluntly dissected, scarpa's fascia was dissected away. Due to the amount of subcutaneous fat, a medium wound protector was put in place for retraction. The external abdominal oblique fascia was identified and sharply opened down to the external inguinal ring. The conjoint tendon and inguinal ligament were identified. The cord structures and sac were dissected free of the surrounding tissue in 360 degrees. A penrose drain was used to encircle the contents. The cremasteric fibers were dissected free of the contents of the cord and hernia sac. The cord structures (vessels and vas deferens) were identified and carefully dissected away from the hernia sac. The hernia sac appeared direct and was more than 10cm in length The hernia sac was dissected down to the internal inguinal ring. Preperitoneal fat was identified showing appropriate dissection. The sac was opened and removed and a 2-0 vicryl was used to close the peritoneum in running fashion. The sac was then reduced into the preperitoneal space. The inguinal floor  was reinforced by bringing the conjoint tendon to the medial inguinal ligament with 2-0 vicryl in interrupted fashion. A 3x6 Bard Soft mesh was then used to close the defect and reinforce the floor. The mesh was sutured to the lacunar ligament and inguinal ligament using a 2-0 prolene in running fashion. Next the superior edge of the mesh was sutured to the conjoined tendon using a 2-0 running Prolene. An additional 2-0 Prolene was used to suture the tail ends of the mesh together re-creating the deep ring. Cord structures are running in a neutral position through the mesh. Next the external abdominal oblique fascia was closed with a 2-0 Vicryl in running fashion to re-create the external inguinal ring. Scarpa's fascia was closed with 3-0 Vicryl in running fashion. Skin was closed with a 4-0 Monocryl subcuticular stitch in running fashion. Dermabond place for dressing. Patient woke from anesthesia and brought to PACU in stable condition. All counts are correct.    Findings: right direct inguinal hernia  Specimen: none  Blood loss: 50 ml  Local anesthesia: TAP block  Complications: none  Implant: 3x6 Bard soft mesh  Gurney Maxin, M.D. General, Bariatric, & Minimally Invasive Surgery [facility] [place] 11:56 AM 06/01/2017

## 2017-06-01 NOTE — Anesthesia Postprocedure Evaluation (Signed)
Anesthesia Post Note  Patient: Russell Robles  Procedure(s) Performed: OPEN RIGHT INGUINAL HERNIA REPAIR WITH MESH (Right Inguinal) INSERTION OF MESH (Right Inguinal)     Patient location during evaluation: PACU Anesthesia Type: Regional Level of consciousness: awake and alert Pain management: pain level controlled Vital Signs Assessment: post-procedure vital signs reviewed and stable Respiratory status: spontaneous breathing, nonlabored ventilation, respiratory function stable and patient connected to nasal cannula oxygen Cardiovascular status: blood pressure returned to baseline and stable Postop Assessment: no apparent nausea or vomiting Anesthetic complications: no    Last Vitals:  Vitals:   06/01/17 1345 06/01/17 1400  BP: (!) 141/94 (!) 149/101  Pulse: 64 64  Resp: 12 11  Temp:    SpO2: 93% 96%    Last Pain:  Vitals:   06/01/17 1327  PainSc: 2                  [name] S

## 2017-06-02 ENCOUNTER — Encounter (HOSPITAL_BASED_OUTPATIENT_CLINIC_OR_DEPARTMENT_OTHER): Payer: Self-pay | Admitting: General Surgery

## 2017-06-06 ENCOUNTER — Encounter: Payer: Self-pay | Admitting: Dietician

## 2017-06-06 NOTE — Progress Notes (Signed)
Pt did not come to diabetes class series beginning 05-22-17.  Called pt and he reports he is recuperating from recent surgery. Rescheduled diabetes class series  beginning 06-26-17

## 2017-06-26 ENCOUNTER — Encounter: Payer: Self-pay | Admitting: Dietician

## 2017-06-26 ENCOUNTER — Ambulatory Visit: Payer: 59

## 2017-06-26 NOTE — Progress Notes (Signed)
Patient cancelled his attendance at the Diabetes classes due to lack of insurance coverage. He does not wish to reschedule.

## 2017-06-27 ENCOUNTER — Encounter: Payer: Self-pay | Admitting: Dietician

## 2017-07-03 ENCOUNTER — Ambulatory Visit: Payer: 59

## 2017-07-10 ENCOUNTER — Ambulatory Visit: Payer: 59

## 2017-08-01 ENCOUNTER — Ambulatory Visit: Payer: 59 | Admitting: Family Medicine

## 2017-08-01 ENCOUNTER — Encounter: Payer: Self-pay | Admitting: Family Medicine

## 2017-08-01 VITALS — BP 124/78 | HR 60 | Ht 68.0 in | Wt 232.3 lb

## 2017-08-01 DIAGNOSIS — E119 Type 2 diabetes mellitus without complications: Secondary | ICD-10-CM | POA: Diagnosis not present

## 2017-08-01 DIAGNOSIS — E1169 Type 2 diabetes mellitus with other specified complication: Secondary | ICD-10-CM | POA: Diagnosis not present

## 2017-08-01 DIAGNOSIS — E669 Obesity, unspecified: Secondary | ICD-10-CM

## 2017-08-01 DIAGNOSIS — E1159 Type 2 diabetes mellitus with other circulatory complications: Secondary | ICD-10-CM | POA: Diagnosis not present

## 2017-08-01 DIAGNOSIS — I10 Essential (primary) hypertension: Secondary | ICD-10-CM | POA: Diagnosis not present

## 2017-08-01 DIAGNOSIS — E785 Hyperlipidemia, unspecified: Secondary | ICD-10-CM

## 2017-08-01 DIAGNOSIS — Z23 Encounter for immunization: Secondary | ICD-10-CM | POA: Diagnosis not present

## 2017-08-01 DIAGNOSIS — I152 Hypertension secondary to endocrine disorders: Secondary | ICD-10-CM

## 2017-08-01 LAB — POCT GLYCOSYLATED HEMOGLOBIN (HGB A1C): Hemoglobin A1C: 8.2

## 2017-08-01 MED ORDER — LIRAGLUTIDE 18 MG/3ML ~~LOC~~ SOPN: PEN_INJECTOR | SUBCUTANEOUS | 1 refills | Status: DC

## 2017-08-01 NOTE — Progress Notes (Signed)
Impression and Recommendations:    1. Diabetes mellitus without complication ([place])   2. Hypertension associated with diabetes ([place])   3. Hyperlipidemia associated with type 2 diabetes mellitus ([name])   4. Obesity, Class I, BMI 30-34.9   5. Flu vaccine need    1. DM - Reviewed the importance of remaining up to date on his eye exam, urine exam, and foot exams.  - His diabetes is not currently under control.  Given his regularly high sugars, patient advised on the possibility of altering his diabetes medications.  Given his concerns about weight gain, he could switch to one of the medications that also promotes weight loss.  - patient will begin taking Victoza along with his other diabetes medication.  - Reviewed the potential risks and benefits of the addition of using Victoza with the patient.  Talked about how the delivery of this medication is via subcutaneous dosage.  Patient knows that he can come in any time to receive education on self-injections with staff here at the office.  - Discussed with the patient that our goal is to get his fasting sugars down under 140-130.  The patient should take action by changing his diet, cutting out carbs (especially white flour products), or cutting way back on carbs.  Our goals are weight loss and better blood pressure control, and minimizing potential side effects.  - Advised the patient that he should be carrying his glucometer with him at all times to test if he experiences alarming symptoms.  - Patient advised to cut down on his carbs for breakfast and lunch if he persists in drinking Dr. Malachi Robles in the morning and at lunch.  He should avoid sandwiches and breads if he is consuming that much sugar in the form of soda.  - Patient knows that it's very important to report if he has another episode similar to the one he had on Christmas, which was near-syncope.  2. HTN - Patient switched to losartan-HCTZ last time and was advised to check his  blood pressure at home to make sure it's working.  He has not been checking his blood pressure regularly at home, due to a broken monitor (per patient).  - Blood pressure looks better today at 124/78.  - Patient advised to take blood pressure monitor with him wherever he goes so that he may use it to take measurements when he experiences any alarming symptoms.  3. HLD - 9 months ago, the patient's LDL was 66, which was at goal.  He has not made any changes in medication since then.  4. General Health Maintenance - Advised the patient to continue working toward exercising to improve health.    - Patient may begin striving toward his goals with 30 minutes of activity 5 days a week.  Recommended that the patient eventually strive for at least 150 minutes of cardio per week according to guidelines established by the [facility].   - Healthy dietary habits encouraged.  Patient should limit carbohydrate intake, especially simple sugars.  Diet should contain high amounts of lean protein, vegetables, and fruits.  Mediterranean Diet recommended.  - Patient should make sure to consume adequate amounts of water - on an average day, at least half of body weight in oz of water per day.  5. Follow-Up - Return in 6-8 weeks to see how he's doing with the addition of Victoza  - At that time, patient will bring a blood sugar log with the time/date and blood sugar  reports. - Patient knows he should record fasting blood sugars, as well as occasional post prandial blood sugars (2 hours after the largest meal). - Patient knows he should check his sugar after he eats especially well, or especially unhealthy.  - Next OV, obtain a CMP on the patient.   Education and routine counseling performed. Handouts provided.  Orders Placed This Encounter  Procedures  . Flu Vaccine QUAD 6+ mos PF IM (Fluarix Quad PF)  . POCT glycosylated hemoglobin (Hb A1C)  . HM Diabetes Foot Exam    Meds ordered this encounter    Medications  . liraglutide (VICTOZA) 18 MG/3ML SOPN    Sig: 0.6mg  [place] qd * 1 wk, then 1.2mg  [place] qd    Dispense:  6 pen    Refill:  1    Return in about 6 weeks (around 09/12/2017) for To go over blood sugar log see how he is doing on new medicine-Victoza, next OV CMP.   The patient was counseled, risk factors were discussed, anticipatory guidance given.  Gross side effects, risk and benefits, and alternatives of medications discussed with patient.  Patient is aware that all medications have potential side effects and we are unable to predict every side effect or drug-drug interaction that may occur.  Expresses verbal understanding and consents to current therapy plan and treatment regimen.  Please see AVS handed out to patient at the end of our visit for further patient instructions/ counseling done pertaining to today's office visit.    Note: This document was prepared using Dragon voice recognition software and may include unintentional dictation errors.  This document serves as a record of services personally performed by Russell Dance, DO. It was created on her behalf by Russell Robles, a trained medical scribe. The creation of this record is based on the scribe's personal observations and the provider's statements to them.   I have reviewed the above medical documentation for accuracy and completeness and I concur.  Russell Robles 08/24/17 4:19 PM     Subjective:    Chief Complaint  Patient presents with  . Follow-up     Russell Robles is a 57 y.o. male who presents to [place] [facility] today for Diabetes Management.     DM HPI: -  He has not been working on diet and exercise for diabetes.  Patient knows he's not eating right. He feels he has cut down on "a lot of stuff" since he went to the nutritionist. He only went to one class with the nutritionist due to insurance, but notes that he has tried to follow what they told him.  He has cut  down on his Dr. Malachi Robles to drinking two 12 oz cans per day.  He drinks these in the morning, and at lunch, and when he returns home, he drinks only water.  Since his surgery on December 13th 2018, he hasn't been walking.  He had hernia repair. Now that he is recovered, he plans on resuming his habit of walking for 30 minutes every other day.  Pt is currently maintained on the following medications for diabetes:   see med list today Medication compliance - Takes his Jentadueto twice a day, as directed.  He has never been on Victoza before.  The patient is willing to change his medication to help control his blood sugars.  He reports that his sugars are still high.  Home glucose readings range:  Fasting sugar this morning was 180. It's been running like  that since his last visit 3 months ago. The highest it's been in the morning is around 200. Lowest is running at 142.  Denies polyuria/polydipsia. Denies hypo/ hyperglycemia symptoms - He denies new onset of: chest pain, exercise intolerance, shortness of breath, dizziness, visual changes, headache, lower extremity swelling or claudication.   Last diabetic eye exam was No results found for: HMDIABEYEEXA  Last year, 2018.  Believes his next eye exam is due in March or May of this year (2019).  He is on schedule to attend it.   Foot exam- UTD   Last A1C in the office was: A1C was 7 5 months ago. Today  Lab Results  Component Value Date   HGBA1C 8.2 08/01/2017   HGBA1C 7.2 05/09/2017   HGBA1C 7.0 02/11/2017    Lab Results  Component Value Date   MICROALBUR 30 05/09/2017   LDLCALC 83 02/11/2017   CREATININE 0.98 05/26/2017     1. HTN HPI:  -  His blood pressure may or may not be controlled at home.  Pt has not been checking it at home. Reports that his monitor hasn't been working.  Switched to losartan-HCTZ last time and was advised to check his blood pressure at home to make sure it's working.  He takes one full tablet  of the blood pressure medicine every morning before work.  He reports that he's been tolerating it well - "it hasn't been bothering me."  - Patient reports good compliance with blood pressure medications  - Denies medication S-E   - Smoking Status noted - never smoker.  Notes that he had an alarming attack on Christmas.   Before dinner, he went out to play with the kids.  He leaned over, and when he stood up, "everything went black."  He notes that he was out for 20-30 seconds.  He was still aware of what was going on, but could not speak to his son.  He was fully aware that he passed out.  During this attack, he denies chest pain, SOB, pain, or anything else aside from "just my jaw lock where I couldn't say anything."  He did not check his blood pressure or blood sugar at that time because he did not have his tools available.  - He denies new onset of: chest pain, exercise intolerance, shortness of breath, dizziness, visual changes, headache, lower extremity swelling or claudication.   Today their BP is @FLOWAMB (5)@   Last 3 blood pressure readings in our office are as follows: BP Readings from Last 3 Encounters:  08/01/17 124/78  06/01/17 138/90  05/09/17 (!) 148/82    Filed Weights   08/01/17 0852  Weight: 232 lb 4.8 oz (105.4 kg)    BMI Readings from Last 3 Encounters:  08/01/17 35.32 kg/m  06/01/17 34.21 kg/m  05/09/17 34.96 kg/m     No problems updated.    Patient Care Team    Relationship Specialty Notifications Start End  Russell Dance, DO PCP - General Family Medicine  03/01/17      Patient Active Problem List   Diagnosis Date Noted  . Diabetes mellitus without complication ([place]) 69/62/9528    Priority: High  . Hypertension associated with diabetes ([place]) 06/18/2015    Priority: High  . Hyperlipidemia associated with type 2 diabetes mellitus ([place]) 06/18/2015    Priority: High  . Obesity, Class I, BMI 30-34.9 04/12/2017    Priority: Medium  .  Inactivity 04/12/2017    Priority: Low  . Age-related nuclear cataract  of both eyes 06/18/2015    Priority: Low  . Inguinal hernia unilateral, non-recurrent 12/13/2010     Past Medical History:  Diagnosis Date  . Cough    PER PT NON-PRODUCTIVE , NO FEVER OR CONGESTION  . Hernia, inguinal, right   . Hyperlipidemia   . Hypertension   . Type 2 diabetes mellitus ([place])    last  HbA1c 7.2 on 05-09-2017 in epic     Past Surgical History:  Procedure Laterality Date  . INGUINAL HERNIA REPAIR Left 12-16-2010  dr Donne Hazel  [facility]  . INGUINAL HERNIA REPAIR Right 06/01/2017   Procedure: OPEN RIGHT INGUINAL HERNIA REPAIR WITH MESH;  Surgeon: Kinsinger, Arta Bruce, MD;  Location: [facility];  Service: General;  Laterality: Right;  GENERAL AND TAP BLOCK  . INSERTION OF MESH Right 06/01/2017   Procedure: INSERTION OF MESH;  Surgeon: Kinsinger, Arta Bruce, MD;  Location: [facility];  Service: General;  Laterality: Right;  GENERAL AND TAP BLOCK     Family History  Problem Relation Age of Onset  . COPD Mother   . Aortic aneurysm Father   . Lung cancer Sister      Social History   Substance and Sexual Activity  Drug Use No  ,  Social History   Substance and Sexual Activity  Alcohol Use No  . Frequency: Never  ,  Social History   Tobacco Use  Smoking Status Never Smoker  Smokeless Tobacco Never Used  ,    Current Outpatient Medications on File Prior to Visit  Medication Sig Dispense Refill  . Linagliptin-Metformin HCl (JENTADUETO) 2.10-998 MG TABS Take 1 tablet by mouth 2 (two) times daily.     Marland Kitchen losartan-hydrochlorothiazide (HYZAAR) 100-12.5 MG tablet Take 1 tablet by mouth daily. (Patient taking differently: Take 1 tablet by mouth every morning. ) 90 tablet 3  . metoprolol succinate (TOPROL-XL) 50 MG 24 hr tablet Take 1 tablet by mouth every morning.     . simvastatin (ZOCOR) 80 MG tablet Take 80 mg by mouth at bedtime.      No current  facility-administered medications on file prior to visit.      No Known Allergies   Review of Systems:   General:  Denies fever, chills Optho/Auditory:   Denies visual changes, blurred vision Respiratory:   Denies SOB, cough, wheeze, DIB  Cardiovascular:   Denies chest pain, palpitations, painful respirations Gastrointestinal:   Denies nausea, vomiting, diarrhea.  Endocrine:     Denies new hot or cold intolerance Musculoskeletal:  Denies joint swelling, gait issues, or new unexplained myalgias/ arthralgias Skin:  Denies rash, suspicious lesions  Neurological:    Denies dizziness, unexplained weakness, numbness  Psychiatric/Behavioral:   Denies mood changes    Objective:     Blood pressure 124/78, pulse 60, height 5\' 8"  (1.727 m), weight 232 lb 4.8 oz (105.4 kg), SpO2 98 %.  Body mass index is 35.32 kg/m.  General: Well Developed, well nourished, and in no acute distress.  HEENT: Normocephalic, atraumatic, pupils equal round reactive to light, neck supple, No carotid bruits, no JVD Skin: Warm and dry, cap RF less 2 sec Cardiac: Regular rate and rhythm, S1, S2 WNL's, no murmurs rubs or gallops Respiratory: ECTA B/L, Not using accessory muscles, speaking in full sentences. NeuroM-Sk: Ambulates w/o assistance, moves ext * 4 w/o difficulty, sensation grossly intact.  Ext: scant edema b/l lower ext Psych: No HI/SI, judgement and insight good, Euthymic mood. Full Affect.

## 2017-08-01 NOTE — Patient Instructions (Addendum)
We are adding Victoza to your regiment.  You are going to take your other diabetes medication in addition to that.  I will see you back in 6 weeks to 8 weeks to see how you are doing on the medicine.  At that time please bring in a blood sugar log that written out of the time date and what your blood sugars are.  Please check fasting as well as occasionally 2-hour postprandial after your largest meal of the day.    please let me know sooner if you have any problems with  Goal for next office visit is going to be to walk 30 minutes at least 5 days a week       Diabetes Mellitus and Standards of Medical Care  Managing diabetes (diabetes mellitus) can be complicated. Your diabetes treatment may be managed by a team of health care providers, including:  A diet and nutrition specialist (registered dietitian).  A nurse.  A certified diabetes educator (CDE).  A diabetes specialist (endocrinologist).  An eye doctor.  A primary care provider.  A dentist.  Your health care providers follow a schedule in order to help you get the best quality of care. The following schedule is a general guideline for your diabetes management plan. Your health care providers may also give you more specific instructions.  HbA1c (hemoglobin A1c) test This test provides information about blood sugar (glucose) control over the previous 2-3 months. It is used to check whether your diabetes management plan needs to be adjusted.  If you are meeting your treatment goals, this test is done at least 2 times a year.  If you are not meeting treatment goals or if your treatment goals have changed, this test is done 4 times a year.  Blood pressure test  This test is done at every routine medical visit. For most people, the goal is less than 130/80. Ask your health care provider what your goal blood pressure should be.  Dental and eye exams  Visit your dentist two times a year.  If you have type 1 diabetes, get an  eye exam 3-5 years after you are diagnosed, and then once a year after your first exam. ? If you were diagnosed with type 1 diabetes as a child, get an eye exam when you are age 40 or older and have had diabetes for 3-5 years. After the first exam, you should get an eye exam once a year.  If you have type 2 diabetes, have an eye exam as soon as you are diagnosed, and then once a year after your first exam.  Foot care exam  Visual foot exams are done at every routine medical visit. The exams check for cuts, bruises, redness, blisters, sores, or other problems with the feet.  A complete foot exam is done by your health care provider once a year. This exam includes an inspection of the structure and skin of your feet, and a check of the pulses and sensation in your feet. ? Type 1 diabetes: Get your first exam 3-5 years after diagnosis. ? Type 2 diabetes: Get your first exam as soon as you are diagnosed.  Check your feet every day for cuts, bruises, redness, blisters, or sores. If you have any of these or other problems that are not healing, contact your health care provider.  Kidney function test (urine microalbumin)  This test is done once a year. ? Type 1 diabetes: Get your first test 5 years after diagnosis. ? Type  2 diabetes: Get your first test as soon as you are diagnosed._  If you have chronic kidney disease (CKD), get a serum creatinine and estimated glomerular filtration rate (eGFR) test once a year.  Lipid profile (cholesterol, HDL, LDL, triglycerides)  This test should be done when you are diagnosed with diabetes, and every 5 years after the first test. If you are on medicines to lower your cholesterol, you may need to get this test done every year. ? The goal for LDL is less than 100 mg/dL (5.5 mmol/L). If you are at high risk, the goal is less than 70 mg/dL (3.9 mmol/L). ? The goal for HDL is 40 mg/dL (2.2 mmol/L) for men and 50 mg/dL(2.8 mmol/L) for women. An HDL cholesterol of  60 mg/dL (3.3 mmol/L) or higher gives some protection against heart disease. ? The goal for triglycerides is less than 150 mg/dL (8.3 mmol/L).  Immunizations  The yearly flu (influenza) vaccine is recommended for everyone 6 months or older who has diabetes.  The pneumonia (pneumococcal) vaccine is recommended for everyone 2 years or older who has diabetes. If you are 73 or older, you may get the pneumonia vaccine as a series of two separate shots.  The hepatitis B vaccine is recommended for adults shortly after they have been diagnosed with diabetes.  The Tdap (tetanus, diphtheria, and pertussis) vaccine should be given: ? According to normal childhood vaccination schedules, for children. ? Every 10 years, for adults who have diabetes.  The shingles vaccine is recommended for people who have had chicken pox and are 50 years or older.  Mental and emotional health  Screening for symptoms of eating disorders, anxiety, and depression is recommended at the time of diagnosis and afterward as needed. If your screening shows that you have symptoms (you have a positive screening result), you may need further evaluation and be referred to a mental health care provider.  Diabetes self-management education  Education about how to manage your diabetes is recommended at diagnosis and ongoing as needed.  Treatment plan  Your treatment plan will be reviewed at every medical visit.  Summary  Managing diabetes (diabetes mellitus) can be complicated. Your diabetes treatment may be managed by a team of health care providers.  Your health care providers follow a schedule in order to help you get the best quality of care.  Standards of care including having regular physical exams, blood tests, blood pressure monitoring, immunizations, screening tests, and education about how to manage your diabetes.  Your health care providers may also give you more specific instructions based on your individual  health.      Type 2 Diabetes Mellitus, Self Care, Adult Caring for yourself after you have been diagnosed with type 2 diabetes (type 2 diabetes mellitus) means keeping your blood sugar (glucose) under control with a balance of:  Nutrition.  Exercise.  Lifestyle changes.  Medicines or insulin, if necessary.  Support from your team of health care providers and others.  The following information explains what you need to know to manage your diabetes at home. What do I need to do to manage my blood glucose?  Check your blood glucose every day, as often as told by your health care provider.  Contact your health care provider if your blood glucose is above your target for 2 tests in a row.  Have your A1c (hemoglobin A1c) level checked at least two times a year, or as often as told by your health care provider. Your health care  provider will set individualized treatment goals for you. Generally, the goal of treatment is to maintain the following blood glucose levels:  Before meals (preprandial): 80-130 mg/dL (4.4-7.2 mmol/L).  After meals (postprandial): below 180 mg/dL (10 mmol/L).  A1c level: less than 7%.  What do I need to know about hyperglycemia and hypoglycemia? What is hyperglycemia? Hyperglycemia, also called high blood glucose, occurs when blood glucose is too high.Make sure you know the early signs of hyperglycemia, such as:  Increased thirst.  Hunger.  Feeling very tired.  Needing to urinate more often than usual.  Blurry vision.  What is hypoglycemia? Hypoglycemia, also called low blood glucose, occurswith a blood glucose level at or below 70 mg/dL (3.9 mmol/L). The risk for hypoglycemia increases during or after exercise, during sleep, during illness, and when skipping meals or not eating for a long time (fasting). It is important to know the symptoms of hypoglycemia and treat it right away. Always have a 15-gram rapid-acting carbohydrate snack with you to  treat low blood glucose. Family members and close friends should also know the symptoms and should understand how to treat hypoglycemia, in case you are not able to treat yourself. What are the symptoms of hypoglycemia? Hypoglycemia symptoms can include:  Hunger.  Anxiety.  Sweating and feeling clammy.  Confusion.  Dizziness or feeling light-headed.  Sleepiness.  Nausea.  Increased heart rate.  Headache.  Blurry vision.  Seizure.  Nightmares.  Tingling or numbness around the mouth, lips, or tongue.  A change in speech.  Decreased ability to concentrate.  A change in coordination.  Restless sleep.  Tremors or shakes.  Fainting.  Irritability.  How do I treat hypoglycemia?  If you are alert and able to swallow safely, follow the 15:15 rule:  Take 15 grams of a rapid-acting carbohydrate. Rapid-acting options include: ? 1 tube of glucose gel. ? 3 glucose pills. ? 6-8 pieces of hard candy. ? 4 oz (120 mL) of fruit juice. ? 4 oz (120 mL) of regular (not diet) soda.  Check your blood glucose 15 minutes after you take the carbohydrate.  If the repeat blood glucose level is still at or below 70 mg/dL (3.9 mmol/L), take 15 grams of a carbohydrate again.  If your blood glucose level does not increase above 70 mg/dL (3.9 mmol/L) after 3 tries, seek emergency medical care.  After your blood glucose level returns to normal, eat a meal or a snack within 1 hour.  How do I treat severe hypoglycemia? Severe hypoglycemia is when your blood glucose level is at or below 54 mg/dL (3 mmol/L). Severe hypoglycemia is an emergency. Do not wait to see if the symptoms will go away. Get medical help right away. Call your local emergency services (911 in the [place] Do not drive yourself to the hospital. If you have severe hypoglycemia and you cannot eat or drink, you may need an injection of glucagon. A family member or close friend should learn how to check your blood glucose and  how to give you a glucagon injection. Ask your health care provider if you need to have an emergency glucagon injection kit available. Severe hypoglycemia may need to be treated in a hospital. The treatment may include getting glucose through an IV tube. You may also need treatment for the cause of your hypoglycemia. Can having diabetes put me at risk for other conditions? Having diabetes can put you at risk for other long-term (chronic) conditions, such as heart disease and kidney disease. Your health  care provider may prescribe medicines to help prevent complications from diabetes. These medicines may include:  Aspirin.  Medicine to lower cholesterol.  Medicine to control blood pressure.  What else can I do to manage my diabetes? Take your diabetes medicines as told  If your health care provider prescribed insulin or diabetes medicines, take them every day.  Do not run out of insulin or other diabetes medicines that you take. Plan ahead so you always have these available.  If you use insulin, adjust your dosage based on how physically active you are and what foods you eat. Your health care provider will tell you how to adjust your dosage. Make healthy food choices  The things that you eat and drink affect your blood glucose and your insulin dosage. Making good choices helps to control your diabetes and prevent other health problems. A healthy meal plan includes eating lean proteins, complex carbohydrates, fresh fruits and vegetables, low-fat dairy products, and healthy fats. Make an appointment to see a diet and nutrition specialist (registered dietitian) to help you create an eating plan that is right for you. Make sure that you:  Follow instructions from your health care provider about eating or drinking restrictions.  Drink enough fluid to keep your urine clear or pale yellow.  Eat healthy snacks between nutritious meals.  Track the carbohydrates that you eat. Do this by reading  food labels and learning the standard serving sizes of foods.  Follow your sick day plan whenever you cannot eat or drink as usual. Make this plan in advance with your health care provider.  Stay active  Exercise regularly, as told by your health care provider. This may include:  Stretching and doing strength exercises, such as yoga or weightlifting, at least 2 times a week.  Doing at least 150 minutes of moderate-intensity or vigorous-intensity exercise each week. This could be brisk walking, biking, or water aerobics. ? Spread out your activity over at least 3 days of the week. ? Do not go more than 2 days in a row without doing some kind of physical activity.  When you start a new exercise or activity, work with your health care provider to adjust your insulin, medicines, or food intake as needed. Make healthy lifestyle choices  Do not use any tobacco products, such as cigarettes, chewing tobacco, and e-cigarettes. If you need help quitting, ask your health care provider.  If your health care provider says that alcohol is safe for you, limit alcohol intake to no more than 1 drink per day for nonpregnant women and 2 drinks per day for men. One drink equals 12 oz of beer, 5 oz of wine, or 1 oz of hard liquor.  Learn to manage stress. If you need help with this, ask your health care provider. Care for your body   Keep your immunizations up to date. In addition to getting vaccinations as told by your health care provider, it is recommended that you get vaccinated against the following illnesses: ? The flu (influenza). Get a flu shot every year. ? Pneumonia. ? Hepatitis B.  Schedule an eye exam soon after your diagnosis, and then one time every year after that.  Check your skin and feet every day for cuts, bruises, redness, blisters, or sores. Schedule a foot exam with your health care provider once every year.  Brush your teeth and gums two times a day, and floss at least one time a  day. Visit your dentist at least once every 6 months.  Maintain a healthy weight. General instructions  Take over-the-counter and prescription medicines only as told by your health care provider.  Share your diabetes management plan with people in your workplace, school, and household.  Check your urine for ketones when you are ill and as told by your health care provider.  Ask your health care provider: ? Do I need to meet with a diabetes educator? ? Where can I find a support group for people with diabetes?  Carry a medical alert card or wear medical alert jewelry.  Keep all follow-up visits as told by your health care provider. This is important. Where to find more information: For more information about diabetes, visit:  American Diabetes Association (ADA): www.diabetes.org  American Association of Diabetes Educators (AADE): www.diabeteseducator.org/patient-resources  This information is not intended to replace advice given to you by your health care provider. Make sure you discuss any questions you have with your health care provider. Document Released: 09/28/2015 Document Revised: 11/12/2015 Document Reviewed: 07/10/2015 Elsevier Interactive Patient Education  2017 [place].      Blood Glucose Monitoring, Adult Monitoring your blood sugar (glucose) helps you manage your diabetes. It also helps you and your health care provider determine how well your diabetes management plan is working. Blood glucose monitoring involves checking your blood glucose as often as directed, and keeping a record (log) of your results over time. Why should I monitor my blood glucose? Checking your blood glucose regularly can:  Help you understand how food, exercise, illnesses, and medicines affect your blood glucose.  Let you know what your blood glucose is at any time. You can quickly tell if you are having low blood glucose (hypoglycemia) or high blood glucose (hyperglycemia).  Help  you and your health care provider adjust your medicines as needed.  When should I check my blood glucose? Follow instructions from your health care provider about how often to check your blood glucose.   This may depend on:  The type of diabetes you have.  How well-controlled your diabetes is.  Medicines you are taking.  If you have type 1 diabetes:  Check your blood glucose at least 2 times a day.  Also check your blood glucose: ? Before every insulin injection. ? Before and after exercise. ? Between meals. ? 2 hours after a meal. ? Occasionally between 2:00 a.m. and 3:00 a.m., as directed. ? Before potentially dangerous tasks, like driving or using heavy machinery. ? At bedtime.  You may need to check your blood glucose more often, up to 6-10 times a day: ? If you use an insulin pump. ? If you need multiple daily injections (MDI). ? If your diabetes is not well-controlled. ? If you are ill. ? If you have a history of severe hypoglycemia. ? If you have a history of not knowing when your blood glucose is getting low (hypoglycemia unawareness).  If you have type 2 diabetes:  If you take insulin or other diabetes medicines, check your blood glucose at least 2 times a day.  If you are on intensive insulin therapy, check your blood glucose at least 4 times a day. Occasionally, you may also need to check between 2:00 a.m. and 3:00 a.m., as directed.  Also check your blood glucose: ? Before and after exercise. ? Before potentially dangerous tasks, like driving or using heavy machinery.  You may need to check your blood glucose more often if: ? Your medicine is being adjusted. ? Your diabetes is not well-controlled. ? You are  ill.  What is a blood glucose log?  A blood glucose log is a record of your blood glucose readings. It helps you and your health care provider: ? Look for patterns in your blood glucose over time. ? Adjust your diabetes management plan as  needed.  Every time you check your blood glucose, write down your result and notes about things that may be affecting your blood glucose, such as your diet and exercise for the day.  Most glucose meters store a record of glucose readings in the meter. Some meters allow you to download your records to a computer. How do I check my blood glucose? Follow these steps to get accurate readings of your blood glucose: Supplies needed   Blood glucose meter.  Test strips for your meter. Each meter has its own strips. You must use the strips that come with your meter.  A needle to prick your finger (lancet). Do not use lancets more than once.  A device that holds the lancet (lancing device).  A journal or log book to write down your results.  Procedure  Wash your hands with soap and water.  Prick the side of your finger (not the tip) with the lancet. Use a different finger each time.  Gently rub the finger until a small drop of blood appears.  Follow instructions that come with your meter for inserting the test strip, applying blood to the strip, and using your blood glucose meter.  Write down your result and any notes.  Alternative testing sites  Some meters allow you to use areas of your body other than your finger (alternative sites) to test your blood.  If you think you may have hypoglycemia, or if you have hypoglycemia unawareness, do not use alternative sites. Use your finger instead.  Alternative sites may not be as accurate as the fingers, because blood flow is slower in these areas. This means that the result you get may be delayed, and it may be different from the result that you would get from your finger.  The most common alternative sites are: ? Forearm. ? Thigh. ? Palm of the hand.  Additional tips  Always keep your supplies with you.  If you have questions or need help, all blood glucose meters have a 24-hour "hotline" number that you can call. You may also  contact your health care provider.  After you use a few boxes of test strips, adjust (calibrate) your blood glucose meter by following instructions that came with your meter.    The American Diabetes Association suggests the following targets for most nonpregnant adults with diabetes.  More or less stringent glycemic goals may be appropriate for each individual.  A1C: Less than 7% A1C may also be reported as eAG: Less than 154 mg/dl Before a meal (preprandial plasma glucose): 80-130 mg/dl 1-2 hours after beginning of the meal (Postprandial plasma glucose)*: Less than 180 mg/dl  *Postprandial glucose may be targeted if A1C goals are not met despite reaching preprandial glucose goals.   GOALS in short:  The goals are for the Hgb A1C to be less than 7.0 & blood pressure to be less than 130/80.    It is recommended that all diabetics are educated on and follow a healthy diabetic diet, exercise for 30 minutes 3-4 times per week (walking, biking, swimming, or machine), monitor blood glucose readings and bring that record with you to be reviewed at your next office visit.     You should be checking fasting  blood sugars- especially after you eat poorly or eat really healthy, and also check 2 hour postprandial blood sugars after largest meal of the day.    Write these down and bring in your log at each office visit.    You will need to be seen every 3 months by the provider managing your Diabetes unless told otherwise by that provider.   You will need yearly eye exams from an eye specialist and foot exams to check the nerves of your feet.  Also, your urine should be checked yearly as well to make sure excess protein is not present.   If you are checking your blood pressure at home, please record it and bring it to your next office visit.    Follow the Dietary Approaches to Stop Hypertension (DASH) diet (3 servings of fruit and vegetables daily, whole grains, low sodium, low-fat proteins).  See  below.    Lastly, when it comes to your cholesterol, the goal is to have the HDL (good cholesterol) >40, and the LDL (bad cholesterol) <100.   It is recommended that you follow a heart healthy, low saturated and trans-fat diet and exercise for 30 minutes at least 5 times a week.     (( Check out the DASH diet = 1.5 Gram Low Sodium Diet   A 1.5 gram sodium diet restricts the amount of sodium in the diet to no more than 1.5 g or 1500 mg daily.  The American Heart Association recommends Americans over the age of 5 to consume no more than 1500 mg of sodium each day to reduce the risk of developing high blood pressure.  Research also shows that limiting sodium may reduce heart attack and stroke risk.  Many foods contain sodium for flavor and sometimes as a preservative.  When the amount of sodium in a diet needs to be low, it is important to know what to look for when choosing foods and drinks.  The following includes some information and guidelines to help make it easier for you to adapt to a low sodium diet.    QUICK TIPS  Do not add salt to food.  Avoid convenience items and fast food.  Choose unsalted snack foods.  Buy lower sodium products, often labeled as "lower sodium" or "no salt added."  Check food labels to learn how much sodium is in 1 serving.  When eating at a restaurant, ask that your food be prepared with less salt or none, if possible.    READING FOOD LABELS FOR SODIUM INFORMATION  The nutrition facts label is a good place to find how much sodium is in foods. Look for products with no more than 400 mg of sodium per serving.  Remember that 1.5 g = 1500 mg.  The food label may also list foods as:  Sodium-free: Less than 5 mg in a serving.  Very low sodium: 35 mg or less in a serving.  Low-sodium: 140 mg or less in a serving.  Light in sodium: 50% less sodium in a serving. For example, if a food that usually has 300 mg of sodium is changed to become light in sodium, it will have  150 mg of sodium.  Reduced sodium: 25% less sodium in a serving. For example, if a food that usually has 400 mg of sodium is changed to reduced sodium, it will have 300 mg of sodium.    CHOOSING FOODS  Grains  Avoid: Salted crackers and snack items. Some cereals, including instant hot  cereals. Bread stuffing and biscuit mixes. Seasoned rice or pasta mixes.  Choose: Unsalted snack items. Low-sodium cereals, oats, puffed wheat and rice, shredded wheat. English muffins and bread. Pasta.  Meats  Avoid: Salted, canned, smoked, spiced, pickled meats, including fish and poultry. Bacon, ham, sausage, cold cuts, hot dogs, anchovies.  Choose: Low-sodium canned tuna and salmon. Fresh or frozen meat, poultry, and fish.  Dairy  Avoid: Processed cheese and spreads. Cottage cheese. Buttermilk and condensed milk. Regular cheese.  Choose: Milk. Low-sodium cottage cheese. Yogurt. Sour cream. Low-sodium cheese.  Fruits and Vegetables  Avoid: Regular canned vegetables. Regular canned tomato sauce and paste. Frozen vegetables in sauces. Olives. Angie Fava. Relishes. Sauerkraut.  Choose: Low-sodium canned vegetables. Low-sodium tomato sauce and paste. Frozen or fresh vegetables. Fresh and frozen fruit.  Condiments  Avoid: Canned and packaged gravies. Worcestershire sauce. Tartar sauce. Barbecue sauce. Soy sauce. Steak sauce. Ketchup. Onion, garlic, and table salt. Meat flavorings and tenderizers.  Choose: Fresh and dried herbs and spices. Low-sodium varieties of mustard and ketchup. Lemon juice. Tabasco sauce. Horseradish.    SAMPLE 1.5 GRAM SODIUM MEAL PLAN:   Breakfast / Sodium (mg)  1 cup low-fat milk / 143 mg  1 whole-wheat English muffin / 240 mg  1 tbs heart-healthy margarine / 153 mg  1 hard-boiled egg / 139 mg  1 small orange / 0 mg  Lunch / Sodium (mg)  1 cup raw carrots / 76 mg  2 tbs no salt added peanut butter / 5 mg  2 slices whole-wheat bread / 270 mg  1 tbs jelly / 6 mg   cup red grapes / 2  mg  Dinner / Sodium (mg)  1 cup whole-wheat pasta / 2 mg  1 cup low-sodium tomato sauce / 73 mg  3 oz lean ground beef / 57 mg  1 small side salad (1 cup raw spinach leaves,  cup cucumber,  cup yellow bell pepper) with 1 tsp olive oil and 1 tsp red wine vinegar / 25 mg  Snack / Sodium (mg)  1 container low-fat vanilla yogurt / 107 mg  3 graham cracker squares / 127 mg  Nutrient Analysis  Calories: 1745  Protein: 75 g  Carbohydrate: 237 g  Fat: 57 g  Sodium: 1425 mg  Document Released: 06/06/2005 Document Revised: 02/16/2011 Document Reviewed: 09/07/2009  ExitCare Patient Information 2012 [place].))    This information is not intended to replace advice given to you by your health care provider. Make sure you discuss any questions you have with your health care provider. Document Released: 06/09/2003 Document Revised: 12/25/2015 Document Reviewed: 11/16/2015 Elsevier Interactive Patient Education  2017 [employer].

## 2017-08-02 ENCOUNTER — Other Ambulatory Visit: Payer: Self-pay

## 2017-08-02 DIAGNOSIS — I10 Essential (primary) hypertension: Principal | ICD-10-CM

## 2017-08-02 DIAGNOSIS — E1159 Type 2 diabetes mellitus with other circulatory complications: Secondary | ICD-10-CM

## 2017-08-02 DIAGNOSIS — I152 Hypertension secondary to endocrine disorders: Secondary | ICD-10-CM

## 2017-08-02 MED ORDER — LOSARTAN POTASSIUM 100 MG PO TABS: 100.0000 mg | ORAL_TABLET | Freq: Every day | ORAL | 3 refills | Status: DC

## 2017-08-02 MED ORDER — HYDROCHLOROTHIAZIDE 12.5 MG PO TABS: 12.5000 mg | ORAL_TABLET | Freq: Every day | ORAL | 3 refills | Status: DC

## 2017-08-02 NOTE — Telephone Encounter (Signed)
Pharmacy sent over request to separate the Losartan and the HCTZ because the combo medication is on back order.  Per Dr. Raliegh Scarlet okay to change and new RXs where sent to the pharmacy. MPulliam, CMA/RT(R)

## 2017-08-11 ENCOUNTER — Ambulatory Visit: Payer: 59 | Admitting: Family Medicine

## 2017-09-12 ENCOUNTER — Ambulatory Visit: Payer: 59 | Admitting: Family Medicine

## 2017-09-12 ENCOUNTER — Encounter: Payer: Self-pay | Admitting: Family Medicine

## 2017-09-12 VITALS — BP 130/83 | HR 52 | Ht 68.0 in | Wt 225.0 lb

## 2017-09-12 DIAGNOSIS — E1169 Type 2 diabetes mellitus with other specified complication: Secondary | ICD-10-CM

## 2017-09-12 DIAGNOSIS — E669 Obesity, unspecified: Secondary | ICD-10-CM

## 2017-09-12 DIAGNOSIS — E785 Hyperlipidemia, unspecified: Secondary | ICD-10-CM | POA: Diagnosis not present

## 2017-09-12 DIAGNOSIS — I10 Essential (primary) hypertension: Secondary | ICD-10-CM

## 2017-09-12 DIAGNOSIS — E118 Type 2 diabetes mellitus with unspecified complications: Secondary | ICD-10-CM

## 2017-09-12 DIAGNOSIS — E1159 Type 2 diabetes mellitus with other circulatory complications: Secondary | ICD-10-CM | POA: Diagnosis not present

## 2017-09-12 DIAGNOSIS — E66811 Obesity, class 1: Secondary | ICD-10-CM

## 2017-09-12 DIAGNOSIS — Z723 Lack of physical exercise: Secondary | ICD-10-CM | POA: Diagnosis not present

## 2017-09-12 DIAGNOSIS — I152 Hypertension secondary to endocrine disorders: Secondary | ICD-10-CM

## 2017-09-12 MED ORDER — METOPROLOL SUCCINATE ER 50 MG PO TB24: 50.0000 mg | ORAL_TABLET | Freq: Every morning | ORAL | 3 refills | Status: DC

## 2017-09-12 MED ORDER — LINAGLIPTIN-METFORMIN HCL 2.5-1000 MG PO TABS: 1.0000 | ORAL_TABLET | Freq: Two times a day (BID) | ORAL | 3 refills | Status: DC

## 2017-09-12 MED ORDER — SIMVASTATIN 80 MG PO TABS: 80.0000 mg | ORAL_TABLET | Freq: Every day | ORAL | 3 refills | Status: DC

## 2017-09-12 NOTE — Patient Instructions (Signed)
Please get a blood pressure monitor for home use such as an Omron 5 -please see the handouts I gave you.  Please check blood pressures on a daily basis and write down.  Please come in sooner than planned if your blood pressures remain above goal of consistently 120-130s\70s-80s.  -Check your fasting blood sugars and 2-hour postprandials especially after you eat healthy and especially after you eat unhealthy.  Goal is to start walking at least 10-20 minutes daily.

## 2017-09-12 NOTE — Progress Notes (Signed)
Impression and Recommendations:    1. Hypertension associated with diabetes ([name])   2. Hyperlipidemia associated with type 2 diabetes mellitus ([name])   3. Type 2 diabetes mellitus with complication, without long-term current use of insulin ([name])   4. Obesity, Class I, BMI 30-34.9   5. Inactivity      1. DM - Last OV, patient began taking Victoza in addition to [name]. - Addition of Victoza is having desired effect.  Patient tolerates well.  Diabetes is now under control.  - Reviewed the importance of remaining up to date on his eye exam, urine exam, and foot exams.  Monitoring Blood Sugar - Discussed with the patient that our goal is fasting sugars under 140-130 - he is at goal currently with fasting sugars around 120's and 130's.    - Continue blood sugar log, with the time/date and blood sugar values.  Patient knows he should record fasting blood sugars, as well as occasional post prandial blood sugars (2 hours after the largest meal).  He should check his sugar if he feels poorly, after he eats especially well, or especially unhealthy.  - Advised the patient that he should be carrying his glucometer with him at all times to test if he experiences alarming symptoms.  Patient knows that it's very important to report if he has another episode similar to the one he had on Christmas, which was near-syncope.  Dietary Goals  - Counseled patient on pathophysiology of disease and discussed various treatment options, which often includes dietary and lifestyle modifications as first line.  Importance of low carb/ketogenic diet discussed with patient in addition to regular exercise.   - Healthy dietary habits encouraged.  Patient should limit carbohydrate intake, especially simple sugars.  Diet should contain high amounts of lean protein, vegetables, and fruits.  Mediterranean Diet recommended.  - Patient advised to especially cut back on carbs if he persists in drinking [name] in  the morning.  Reviewed the importance of cutting out sugary, high carb drinks.   2. HTN - Last appointment, BP was 120's/70's.  - Reviewed goal as 120's-130's/70's-80's, preferably in the lower end of this range.  - Discussed the importance of keeping his blood pressure down, especially as a diabetic.  - Patient switched to losartan-HCTZ two OV ago.  Was advised to monitor his medication and check his blood pressure at home to make sure it is working.  He HAS NOT been checking his blood pressure regularly at home.  Patient has not purchased a new monitor yet.  - Patient advised to take a blood pressure monitor with him wherever he goes so that he may use it to take measurements when he experiences any alarming symptoms.  3. HLD - Patient's last LDL 10 months ago was 66, which was at goal.  He has not made any changes in medication since then.   4. General Health Maintenance - Advised the patient to continue working toward exercising to improve health.    - Patient may begin striving toward his goals by walking for 10-20 minutes daily.  Recommended that the patient eventually strive for at least 150 minutes of cardio per week according to guidelines established by the [facility].   - Patient should make sure to consume adequate amounts of water - on an average day, at least half of body weight in oz of water per day.  -Told patient if he continues with his testicular pain and/or postsurgical hernia pain he will let us  know and we can refer him to urology or he can return to his surgeon.  He will let us know in the future what he desires   5. Follow-Up - Return in 3 months to check on blood pressure.  - Will review status of diabetes and blood sugars at that time.   Education and routine counseling performed. Handouts provided.   Meds ordered this encounter  Medications  . Linagliptin-metFORMIN HCl (JENTADUETO) 2.10-998 MG TABS    Sig: Take 1 tablet by mouth 2 (two) times daily.      Dispense:  180 tablet    Refill:  3  . metoprolol succinate (TOPROL-XL) 50 MG 24 hr tablet    Sig: Take 1 tablet (50 mg total) by mouth every morning.    Dispense:  90 tablet    Refill:  3  . simvastatin (ZOCOR) 80 MG tablet    Sig: Take 1 tablet (80 mg total) by mouth at bedtime.    Dispense:  90 tablet    Refill:  3    Return in about 3 months (around 12/13/2017) for Follow-up around [phone] which would be 3 months from when it was last checked.   The patient was counseled, risk factors were discussed, anticipatory guidance given.  Gross side effects, risk and benefits, and alternatives of medications discussed with patient.  Patient is aware that all medications have potential side effects and we are unable to predict every side effect or drug-drug interaction that may occur.  Expresses verbal understanding and consents to current therapy plan and treatment regimen.  Please see AVS handed out to patient at the end of our visit for further patient instructions/ counseling done pertaining to today's office visit.    Note: This document was prepared using Dragon voice recognition software and may include unintentional dictation errors.  This document serves as a record of services personally performed by Mellody Dance, DO. It was created on her behalf by [name], a trained medical scribe. The creation of this record is based on the scribe's personal observations and the provider's statements to them.   I have reviewed the above medical documentation for accuracy and completeness and I concur.  Mellody Dance 09/12/17 3:57 PM     Subjective:    Chief Complaint  Patient presents with  . Follow-up    Russell Robles is a 56 y.o. male who presents to [place] at [facility] today for Diabetes Management.    Last appointment we added Victoza.  His weight went down since last OV:  229 on 08/15/2017.  217 this morning on 09/12/2017.  Had hernia  surgery back in December.   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 Notes that he feels it in his knees and hips - has achy knees.  He is now drinking only one [name] per day, down from 2 (one at breakfast, one at lunch).  Pt is currently maintained on the following medications for diabetes:   see med list today Medication compliance - Is taking his Victoza as prescribed, and continues on Jentadueto.  Home glucose readings range: Was previously in 200's, 190's, 170's.  Fasting blood sugars roughly in the 130's and 120's, regularly. 2 hour post-prandials are less than 170.   Denies polyuria/polydipsia. Denies  hypo/ hyperglycemia symptoms - He denies new onset of: chest pain, exercise intolerance, shortness of breath, dizziness, visual changes, headache, lower extremity swelling or claudication.   Last diabetic eye exam was No results found for: HMDIABEYEEXA  Foot exam- UTD  Last A1C in the office was:  Lab Results  Component Value Date   HGBA1C 8.2 08/01/2017   HGBA1C 7.2 05/09/2017   HGBA1C 7.0 02/11/2017    Lab Results  Component Value Date   MICROALBUR 30 05/09/2017   LDLCALC 83 02/11/2017   CREATININE 0.98 05/26/2017    1. HTN HPI:  -  His blood pressure has not been controlled at home.  Pt is not checking it at home.   - Patient reports good compliance with blood pressure medications  - Denies medication S-E   - Smoking Status noted   - He denies new onset of: chest pain, exercise intolerance, shortness of breath, dizziness, visual changes, headache, lower extremity swelling or claudication.   Last 3 blood pressure readings in our office are as follows: BP Readings from Last 3 Encounters:  09/12/17 130/83  08/01/17 124/78  06/01/17 138/90    Filed Weights    09/12/17 1353  Weight: 225 lb (102.1 kg)    BMI Readings from Last 3 Encounters:  09/12/17 34.21 kg/m  08/01/17 35.32 kg/m  06/01/17 34.21 kg/m     Problem  Diabetes Mellitus Type 2 With Complications ([name])      Patient Care Team    Relationship Specialty Notifications Start End  Mellody Dance, DO PCP - General Family Medicine  03/01/17      Patient Active Problem List   Diagnosis Date Noted  . Diabetes mellitus type 2 with complications ([place]) 09[birth year]/1914    Priority: High  . Hypertension associated with diabetes ([place]) 06/18/2015    Priority: High  . Hyperlipidemia associated with type 2 diabetes mellitus ([place]) 06/18/2015    Priority: High  . Obesity, Class I, BMI 30-34.9 04/12/2017    Priority: Medium  . Inactivity 04/12/2017    Priority: Low  . Age-related nuclear cataract of both eyes 06/18/2015    Priority: Low  . Inguinal hernia unilateral, non-recurrent 12/13/2010     Past Medical History:  Diagnosis Date  . Cough    PER PT NON-PRODUCTIVE , NO FEVER OR CONGESTION  . Hernia, inguinal, right   . Hyperlipidemia   . Hypertension   . Type 2 diabetes mellitus ([place])    last  HbA1c 7.2 on 05-09-2017 in epic     Past Surgical History:  Procedure Laterality Date  . INGUINAL HERNIA REPAIR Left 12-16-2010  dr Donne Hazel  [facility]  . INGUINAL HERNIA REPAIR Right 06/01/2017   Procedure: OPEN RIGHT INGUINAL HERNIA REPAIR WITH MESH;  Surgeon: Kinsinger, Arta Bruce, MD;  Location: [place];  Service: General;  Laterality: Right;  GENERAL AND TAP BLOCK  . INSERTION OF MESH Right 06/01/2017   Procedure: INSERTION OF MESH;  Surgeon: Kinsinger, Arta Bruce, MD;  Location: [facility];  Service: General;  Laterality: Right;  GENERAL AND TAP BLOCK     Family History  Problem Relation Age of Onset  . COPD Mother   . Aortic aneurysm Father   . Lung cancer Sister      Social History   Substance and Sexual Activity  Drug Use No  ,    Social History   Substance and Sexual Activity  Alcohol Use No  . Frequency: Never  ,  Social History  Tobacco Use  Smoking Status Never Smoker  Smokeless Tobacco Never Used  ,    Current Outpatient Medications on File Prior to Visit  Medication Sig Dispense Refill  . liraglutide (VICTOZA) 18 MG/3ML SOPN 0.6mg  [place] qd * 1 wk, then 1.2mg  [place] qd 6 pen 1  . losartan-hydrochlorothiazide (HYZAAR) 100-12.5 MG tablet Take 1 tablet by mouth daily. (Patient taking differently: Take 1 tablet by mouth every morning. ) 90 tablet 3   No current facility-administered medications on file prior to visit.      No Known Allergies   Review of Systems:   General:  Denies fever, chills Optho/Auditory:   Denies visual changes, blurred vision Respiratory:   Denies SOB, cough, wheeze, DIB  Cardiovascular:   Denies chest pain, palpitations, painful respirations Gastrointestinal:   Denies nausea, vomiting, diarrhea.  Endocrine:     Denies new hot or cold intolerance Musculoskeletal:  Denies joint swelling, gait issues, or new unexplained myalgias/ arthralgias Skin:  Denies rash, suspicious lesions  Neurological:    Denies dizziness, unexplained weakness, numbness  Psychiatric/Behavioral:   Denies mood changes    Objective:     Blood pressure 130/83, pulse (!) 52, height 5\' 8"  (1.727 m), weight 225 lb (102.1 kg), SpO2 98 %.  Body mass index is 34.21 kg/m.  General: Well Developed, well nourished, and in no acute distress.  HEENT: Normocephalic, atraumatic, pupils equal round reactive to light, neck supple, No carotid bruits, no JVD Skin: Warm and dry, cap RF less 2 sec Cardiac: Regular rate and rhythm, S1, S2 WNL's, no murmurs rubs or gallops Respiratory: ECTA B/L, Not using accessory muscles, speaking in full sentences. NeuroM-Sk: Ambulates w/o assistance, moves ext * 4 w/o difficulty, sensation grossly intact.  Ext: scant edema b/l lower ext Psych: No HI/SI, judgement and insight good,  Euthymic mood. Full Affect.

## 2017-10-17 ENCOUNTER — Ambulatory Visit: Payer: 59

## 2017-10-17 ENCOUNTER — Ambulatory Visit (INDEPENDENT_AMBULATORY_CARE_PROVIDER_SITE_OTHER): Payer: 59 | Admitting: Family Medicine

## 2017-10-17 ENCOUNTER — Encounter: Payer: Self-pay | Admitting: Family Medicine

## 2017-10-17 VITALS — BP 113/75 | HR 58 | Ht 68.0 in | Wt 224.8 lb

## 2017-10-17 DIAGNOSIS — M25512 Pain in left shoulder: Secondary | ICD-10-CM | POA: Insufficient documentation

## 2017-10-17 DIAGNOSIS — M19012 Primary osteoarthritis, left shoulder: Secondary | ICD-10-CM

## 2017-10-17 NOTE — Progress Notes (Signed)
Acute Care Office visit  Assessment and plan:  1. Acute pain of left shoulder   2. Arthritis of left shoulder region     1. Acute pain of L shoulder  -Xray done in office today. No acute fracture or gross abnormalities found- mild arthritis found to be age-appropriate. Will contact pt if any abnormalities found by radiologist.  -Continue OTC meds for pain. Stop aleve or ibuprofen and take tylenol instead.  -Apply ice 15-20 minutes per day, 3-5 times a day.  -Slowly over time return to light and normal activity.  -Will consider potential referral to orthopedist in future if symptoms do not improve.    Orders Placed This Encounter  Procedures  . DG Shoulder Left    Gross side effects, risk and benefits, and alternatives of medications discussed with patient.  Patient is aware that all medications have potential side effects and we are unable to predict every sideeffect or drug-drug interaction that may occur.  Expresses verbal understanding and consents to current therapy plan and treatment regiment.   Education and routine counseling performed. Handouts provided.  Anticipatory guidance and routine counseling done re: condition, txmnt options and need for follow up. All questions of patient's were answered.  Return if symptoms worsen or fail to improve, for Follow-up for routine care of chronic conditions as previously discussed.  Please see AVS handed out to patient at the end of our visit for additional patient instructions/ counseling done pertaining to today's office visit.  Note: This document was partially repared using Dragon voice recognition software and may include unintentional dictation errors.   This document serves as a record of services personally performed by Mellody Dance, DO. It was created on her behalf by Mayer Masker, a trained medical scribe. The creation of this record is based on the scribe's personal observations and the provider's statements to  them.   I have reviewed the above medical documentation for accuracy and completeness and I concur.  Mellody Dance 10/17/17 2:37 PM    Subjective:    Chief Complaint  Patient presents with  . Shoulder Pain    HPI:  Pt presents with Sx for 7+ days   C/o: L shoulder (anterior aspect) pain.   He states last week he was playing with the kids and tripped and fell. He fell on his outstretched hand, as if he was doing a pushup. He denies any pain after the accident or trauma to the head.   3-4 days later, he states he felt a twinge and burning in his L shoulder, then the next day he could not lift his L arm. It hurt to touch and radiated down to his L arm.   Denies: warmth or swelling    For symptoms patient has tried:  Ibuprofen Q 6 hours.  Overall getting:   Improved slightly.    Patient Care Team    Relationship Specialty Notifications Start End  Mellody Dance, DO PCP - General Family Medicine  03/01/17     Past medical history, Surgical history, Family history reviewed and noted below, Social history, Allergies, and Medications have been entered into the medical record, reviewed and changed as needed.   No Known Allergies  Review of Systems: - see above HPI for pertinent positives General:   No F/C, wt loss Pulm:   No DIB, pleuritic chest pain Card:  No CP, palpitations Abd:  No n/v/d or pain Ext:  No inc edema from baseline   Objective:   Blood pressure  113/75, pulse (!) 58, height 5\' 8"  (1.727 m), weight 224 lb 12.8 oz (102 kg), SpO2 97 %. Body mass index is 34.18 kg/m. General: Well Developed, well nourished, appropriate for stated age.  Neuro: Alert and oriented x3, extra-ocular muscles intact, sensation grossly intact.  HEENT: Normocephalic, atraumatic, pupils equal round reactive to light, neck supple, no masses, no painful lymphadenopathy, TM's intact B/L, no acute findings. Nares- patent, clear d/c, OP- clear, mild erythema, No TTP sinuses Skin: Warm  and dry, no gross rash. Cardiac: RRR, S1 S2,  no murmurs rubs or gallops.  Respiratory: ECTA B/L and A/P, Not using accessory muscles, speaking in full sentences- unlabored. Vascular:  No gross lower ext edema, cap RF less 2 sec. Psych: No HI/SI, judgement and insight good, Euthymic mood. Full Affect. L Shoulder: Inspection reveals no abnormalities, atrophy or asymmetry. Palpation is normal with no tenderness over AC joint, supraspinatus tendon insertion point or bicipital groove. ROM is essentially full in all planes. No signs of impingement with negative Neer and Hawkin's tests,  No sings of supraspinatus tendon instability- negative empty can  Rotator cuff muscle strength normal throughout * 4 No labral pathology noted with negative Obrien's and good stability. No biceps tendon pathology with Negative Speed's test Normal scapular function observed. No apprehension sign

## 2017-10-17 NOTE — Patient Instructions (Signed)
Martice please use ice to your shoulder for 15-20 minutes 3 to-4 times daily.    Please try to significantly limit the Advil\ibuprofen\Aleve\Naprosyn and the like that you are using over-the-counter for pain.  Please do a gradual return to regular activities with just overhead activities without any weight for a week and progressed to 5 pounds of overhead activities and lifting on that shoulder for a week and slowly progressing each week for additional weight on the shoulder as tolerated.

## 2017-10-23 ENCOUNTER — Other Ambulatory Visit: Payer: Self-pay | Admitting: Family Medicine

## 2017-10-23 DIAGNOSIS — E119 Type 2 diabetes mellitus without complications: Secondary | ICD-10-CM

## 2017-12-05 ENCOUNTER — Telehealth: Payer: Self-pay

## 2017-12-05 NOTE — Telephone Encounter (Signed)
Patient's wife sent MyChart message in regards to a possible insect bite with picture.  Called wife in regards to my chart message on her husband.  Dr. Raliegh Scarlet has reviewed the pictures sent and advised to monitor area for changes.  To keep the area clean and dry.  Patient denies any fever, muscle aches, or dizziness.  Patient will follow up in the office if any of these or other symptoms develop or if the area gets larger.  Per Dr. Raliegh Scarlet patient is to use OTC hydrocortisone cream on the area for itching. MPulliam, CMA/RT(R)

## 2017-12-07 ENCOUNTER — Other Ambulatory Visit: Payer: Self-pay | Admitting: Family Medicine

## 2017-12-07 DIAGNOSIS — E119 Type 2 diabetes mellitus without complications: Secondary | ICD-10-CM

## 2017-12-13 ENCOUNTER — Ambulatory Visit: Payer: 59 | Admitting: Family Medicine

## 2017-12-19 ENCOUNTER — Encounter: Payer: Self-pay | Admitting: Family Medicine

## 2017-12-19 ENCOUNTER — Ambulatory Visit: Payer: 59 | Admitting: Family Medicine

## 2017-12-19 VITALS — BP 132/80 | HR 55 | Ht 68.0 in | Wt 227.6 lb

## 2017-12-19 DIAGNOSIS — E1169 Type 2 diabetes mellitus with other specified complication: Secondary | ICD-10-CM | POA: Diagnosis not present

## 2017-12-19 DIAGNOSIS — E118 Type 2 diabetes mellitus with unspecified complications: Secondary | ICD-10-CM | POA: Diagnosis not present

## 2017-12-19 DIAGNOSIS — I10 Essential (primary) hypertension: Secondary | ICD-10-CM

## 2017-12-19 DIAGNOSIS — I152 Hypertension secondary to endocrine disorders: Secondary | ICD-10-CM

## 2017-12-19 DIAGNOSIS — R011 Cardiac murmur, unspecified: Secondary | ICD-10-CM | POA: Diagnosis not present

## 2017-12-19 DIAGNOSIS — E669 Obesity, unspecified: Secondary | ICD-10-CM

## 2017-12-19 DIAGNOSIS — E1159 Type 2 diabetes mellitus with other circulatory complications: Secondary | ICD-10-CM

## 2017-12-19 DIAGNOSIS — Z723 Lack of physical exercise: Secondary | ICD-10-CM

## 2017-12-19 DIAGNOSIS — E785 Hyperlipidemia, unspecified: Secondary | ICD-10-CM

## 2017-12-19 LAB — POCT GLYCOSYLATED HEMOGLOBIN (HGB A1C): Hemoglobin A1C: 6.3 % — AB (ref 4.0–5.6)

## 2017-12-19 NOTE — Patient Instructions (Addendum)
Last full set of labs was around 8/ 25/ 18 with your previous provider.    Please schedule a complete physical exam and please come fasting-  So we can update them during a yearly physical.     GOALS :  The goals are for the Hgb A1C to be less than 7.0 & blood pressure to be less than 130/80.    It is recommended that all diabetics are educated on and follow a healthy diabetic diet, exercise for 30 minutes 3-4 times per week (walking, biking, swimming, or machine), monitor blood glucose readings and bring that record with you to be reviewed at your next office visit.     You should be checking fasting blood sugars- especially after you eat poorly or eat really healthy, and also check 2 hour postprandial blood sugars after largest meal of the day.    Write these down and bring in your log at each office visit.    You will need to be seen every 3 months by the provider managing your Diabetes unless told otherwise by that provider.   You will need yearly eye exams from an eye specialist and foot exams to check the nerves of your feet.  Also, your urine should be checked yearly as well to make sure excess protein is not present.   If you are checking your blood pressure at home, please record it and bring it to your next office visit.    Follow the Dietary Approaches to Stop Hypertension (DASH) diet (3 servings of fruit and vegetables daily, whole grains, low sodium, low-fat proteins).  See below.    Lastly, when it comes to your cholesterol, the goal is to have the HDL (good cholesterol) >40, and the LDL (bad cholesterol) <100.   It is recommended that you follow a heart healthy, low saturated and trans-fat diet and exercise for 30 minutes at least 5 times a week.     (( Check out the DASH diet = 1.5 Gram Low Sodium Diet   A 1.5 gram sodium diet restricts the amount of sodium in the diet to no more than 1.5 g or 1500 mg daily.  The American Heart Association recommends Americans over the age  of 57 to consume no more than 1500 mg of sodium each day to reduce the risk of developing high blood pressure.  Research also shows that limiting sodium may reduce heart attack and stroke risk.  Many foods contain sodium for flavor and sometimes as a preservative.  When the amount of sodium in a diet needs to be low, it is important to know what to look for when choosing foods and drinks.  The following includes some information and guidelines to help make it easier for you to adapt to a low sodium diet.    QUICK TIPS  Do not add salt to food.  Avoid convenience items and fast food.  Choose unsalted snack foods.  Buy lower sodium products, often labeled as "lower sodium" or "no salt added."  Check food labels to learn how much sodium is in 1 serving.  When eating at a restaurant, ask that your food be prepared with less salt or none, if possible.    READING FOOD LABELS FOR SODIUM INFORMATION  The nutrition facts label is a good place to find how much sodium is in foods. Look for products with no more than 400 mg of sodium per serving.  Remember that 1.5 g = 1500 mg.  The food label may  also list foods as:  Sodium-free: Less than 5 mg in a serving.  Very low sodium: 35 mg or less in a serving.  Low-sodium: 140 mg or less in a serving.  Light in sodium: 50% less sodium in a serving. For example, if a food that usually has 300 mg of sodium is changed to become light in sodium, it will have 150 mg of sodium.  Reduced sodium: 25% less sodium in a serving. For example, if a food that usually has 400 mg of sodium is changed to reduced sodium, it will have 300 mg of sodium.    CHOOSING FOODS  Grains  Avoid: Salted crackers and snack items. Some cereals, including instant hot cereals. Bread stuffing and biscuit mixes. Seasoned rice or pasta mixes.  Choose: Unsalted snack items. Low-sodium cereals, oats, puffed wheat and rice, shredded wheat. English muffins and bread. Pasta.  Meats  Avoid: Salted,  canned, smoked, spiced, pickled meats, including fish and poultry. Bacon, ham, sausage, cold cuts, hot dogs, anchovies.  Choose: Low-sodium canned tuna and salmon. Fresh or frozen meat, poultry, and fish.  Dairy  Avoid: Processed cheese and spreads. Cottage cheese. Buttermilk and condensed milk. Regular cheese.  Choose: Milk. Low-sodium cottage cheese. Yogurt. Sour cream. Low-sodium cheese.  Fruits and Vegetables  Avoid: Regular canned vegetables. Regular canned tomato sauce and paste. Frozen vegetables in sauces. Olives. Angie Fava. Relishes. Sauerkraut.  Choose: Low-sodium canned vegetables. Low-sodium tomato sauce and paste. Frozen or fresh vegetables. Fresh and frozen fruit.  Condiments  Avoid: Canned and packaged gravies. Worcestershire sauce. Tartar sauce. Barbecue sauce. Soy sauce. Steak sauce. Ketchup. Onion, garlic, and table salt. Meat flavorings and tenderizers.  Choose: Fresh and dried herbs and spices. Low-sodium varieties of mustard and ketchup. Lemon juice. Tabasco sauce. Horseradish.    SAMPLE 1.5 GRAM SODIUM MEAL PLAN:   Breakfast / Sodium (mg)  1 cup low-fat milk / 143 mg  1 whole-wheat English muffin / 240 mg  1 tbs heart-healthy margarine / 153 mg  1 hard-boiled egg / 139 mg  1 small orange / 0 mg  Lunch / Sodium (mg)  1 cup raw carrots / 76 mg  2 tbs no salt added peanut butter / 5 mg  2 slices whole-wheat bread / 270 mg  1 tbs jelly / 6 mg   cup red grapes / 2 mg  Dinner / Sodium (mg)  1 cup whole-wheat pasta / 2 mg  1 cup low-sodium tomato sauce / 73 mg  3 oz lean ground beef / 57 mg  1 small side salad (1 cup raw spinach leaves,  cup cucumber,  cup yellow bell pepper) with 1 tsp olive oil and 1 tsp red wine vinegar / 25 mg  Snack / Sodium (mg)  1 container low-fat vanilla yogurt / 107 mg  3 graham cracker squares / 127 mg  Nutrient Analysis  Calories: 1745  Protein: 75 g  Carbohydrate: 237 g  Fat: 57 g  Sodium: 1425 mg  Document Released: 06/06/2005  Document Revised: 02/16/2011 Document Reviewed: 09/07/2009  [facility] Patient Information 2012 [place], [place].))

## 2017-12-19 NOTE — Progress Notes (Signed)
Impression and Recommendations:    1. Type 2 diabetes mellitus with complication, without long-term current use of insulin (Russell Robles)   2. new onset Murmur, heart   3. Hypertension associated with diabetes (Russell Robles)   4. Hyperlipidemia associated with type 2 diabetes mellitus (Russell Robles)   5. Obesity, Class I, BMI 30-34.9   6. Inactivity     1. Diabetes Mellitus - Patient asymptomatic at this time, A1c under GREAT control.  Good work reducing your A1c from 8.2 to 6.3! - Continue treatment and medication as prescribed.  No changes made today.  - Counseled patient on pathophysiology of disease and discussed various treatment options, including continued dietary and lifestyle modifications as first line.  Importance of low carb/ketogenic diet discussed with patient in addition to regular exercise.   - Check FBS and 2 hours after the biggest meal of your day.  Keep log and bring in next OV for my review.   Also, if you ever feel poorly, please check your blood pressure and blood sugar, as one or the other could be the cause of your symptoms.  - Being a diabetic, you need yearly eye and foot exams. Make appt.for diabetic eye exam.  - Next visit, if his A1c remains under good control, patient will let us know which diabetes medication he has most difficulty obtaining, and we will consider making changes at that time.   2. Blood Pressure - Patient tolerates medication well and denies side-effects. - Continue medications and treatments as prescribed.  No changes made today.  3. New Onset Heart Murmur - Exercise echocardiogram ordered.  4. Cholesterol - Patient tolerates medication well and denies side-effects. - Continue Zocor nightly.  No changes made today.  5. BMI Counseling Explained to patient what BMI refers to, and what it means medically.    Told patient to think about it as a "medical risk stratification measurement" and how increasing BMI is associated with increasing risk/ or worsening  state of various diseases such as hypertension, hyperlipidemia, diabetes, premature OA, depression etc.  American Heart Association guidelines for healthy diet, basically Mediterranean diet, and exercise guidelines of 30 minutes 5 days per week or more discussed in detail.  Health counseling performed.  All questions answered.  6. Lifestyle & Preventative Health Maintenance - Advised patient to continue working toward exercising to improve health.    -  Advised patient to walk for a little bit of time during his lunch break, or after dinner with his wife.  Recommended that the patient eventually strive for at least 150 minutes of moderate cardiovascular activity per week according to guidelines established by the [facility] (Russell Robles).   - Healthy dietary habits encouraged, including low-carb, and high amounts of lean protein in diet.   - Patient should also consume adequate amounts of water - half of body weight in oz of water per day.   Education and routine counseling performed. Handouts provided.  7. Follow-Up - Last lab work drawn 02/11/2017 with previous provider; patient is due for labs next CPE.  - Patient notes that his next diabetic eye exam is scheduled in August.  - Return in 4 months.    Orders Placed This Encounter  Procedures  . POCT glycosylated hemoglobin (Hb A1C)  . ECHOCARDIOGRAM COMPLETE    No orders of the defined types were placed in this encounter.   Return for 2 months or so for complete physical exam, come fasting.   The patient was counseled, risk factors were discussed, anticipatory guidance  given.  Gross side effects, risk and benefits, and alternatives of medications discussed with patient.  Patient is aware that all medications have potential side effects and we are unable to predict every side effect or drug-drug interaction that may occur.  Expresses verbal understanding and consents to current therapy plan and treatment regimen.  Please see AVS handed out to  patient at the end of our visit for further patient instructions/ counseling done pertaining to today's office visit.    Note: This document was prepared using Dragon voice recognition software and may include unintentional dictation errors.  This document serves as a record of services personally performed by Mellody Dance, DO. It was created on her behalf by Russell Robles, a trained medical scribe. The creation of this record is based on the scribe's personal observations and the provider's statements to them.   I have reviewed the above medical documentation for accuracy and completeness and I concur.  Mellody Dance 12/20/17 12:47 PM     Subjective:    Chief Complaint  Patient presents with  . Follow-up    Russell Robles is a 57 y.o. male who presents to [name] at [name] today for Diabetes Management.    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  "By the time I get home, I'm wore out and I just don't feel like doing nothing."  Notes that his coworker has cancer so he's had to pick up his work.  Remarks that usually he's working through lunch.  Patient denies dizziness, SOB, reports no SOB with exertion, and denies other cardiac symptoms.  Notes that he had a stress test a "long time ago."  DM HPI: -  He has been working on diet for diabetes.  He is not exercising.  He isn't sure what he's been eating differently to cause his stark change in A1c. Remarks "we are eating a lot more chicken."  He cut out Dr. Samson Frederic; is drinking Pepsi Zero.   Drinks 2 or 3 Pepsi Zeros per day.  Notes that he used to drink anywhere from ten to fifteen 12-20 oz Dr. Samson Frederic per day.  Pt is currently maintained on the following medications for diabetes:   see med list today Medication compliance - continues as prescribed.   Home glucose readings range: has been checking it every day at home. States that his fasting blood glucose runs at about 120-130 at home.     Denies polyuria/polydipsia. Denies hypo/ hyperglycemia symptoms - He denies new onset of: chest pain, exercise intolerance, shortness of breath, dizziness, visual changes, headache, lower extremity swelling or claudication.   Last diabetic eye exam was No results found for: HMDIABEYEEXA  Foot exam- UTD  Last A1C in the office was:  Lab Results  Component Value Date   HGBA1C 6.3 (A) 12/19/2017   HGBA1C 8.2 08/01/2017   HGBA1C 7.2 05/09/2017    Lab Results  Component Value Date   MICROALBUR 30 05/09/2017   [place] 83 02/11/2017   CREATININE 0.98 05/26/2017    Last 3 blood pressure readings in our office are as follows: BP Readings from Last 3 Encounters:  12/19/17 132/80  10/17/17 113/75  09/12/17 130/83    BMI Readings from Last 3 Encounters:  12/19/17 34.61 kg/m  10/17/17 34.18 kg/m  09/12/17 34.21 kg/m    1. 57 y.o. male here for cholesterol follow-up.   - Patient reports good compliance with medications or treatment plan  - Denies medication S-E   -  Smoking Status noted   - He denies new onset of: chest pain, exercise intolerance, shortness of breath, dizziness, visual changes, headache, lower extremity swelling or claudication.   Denies myalgias.  The cholesterol last visit was:  Lab Results  Component Value Date   CHOL 131 02/11/2017   HDL 32 (A) 02/11/2017   LDLCALC 83 02/11/2017   TRIG 120 02/11/2017    Hepatic Function Latest Ref Rng & Units 02/11/2017 01/02/2016  AST 14 - 40 24 19  ALT 10 - 40 25 22  Alk Phosphatase 25 - 125 53 54    No problems updated.    Patient Care Team    Relationship Specialty Notifications Start End  Mellody Dance, DO PCP - General Family Medicine  03/01/17      Patient Active Problem List   Diagnosis Date Noted  . Diabetes mellitus type 2 with complications ([place]) 14/48/1856    Priority: High  . Hypertension associated with diabetes ([place]) 06/18/2015    Priority: High  . Hyperlipidemia associated  with type 2 diabetes mellitus ([place]) 06/18/2015    Priority: High  . Obesity, Class I, BMI 30-34.9 04/12/2017    Priority: Medium  . Inactivity 04/12/2017    Priority: Low  . Age-related nuclear cataract of both eyes 06/18/2015    Priority: Low  . new onset Murmur, heart 12/19/2017  . Acute pain of left shoulder 10/17/2017  . Inguinal hernia unilateral, non-recurrent 12/13/2010     Past Medical History:  Diagnosis Date  . Cough    PER PT NON-PRODUCTIVE , NO FEVER OR CONGESTION  . Hernia, inguinal, right   . Hyperlipidemia   . Hypertension   . Type 2 diabetes mellitus ([place])    last  HbA1c 7.2 on 05-09-2017 in epic     Past Surgical History:  Procedure Laterality Date  . INGUINAL HERNIA REPAIR Left 12-16-2010  dr Donne Hazel  [facility]  . INGUINAL HERNIA REPAIR Right 06/01/2017   Procedure: OPEN RIGHT INGUINAL HERNIA REPAIR WITH MESH;  Surgeon: Kinsinger, Arta Bruce, MD;  Location: [facility];  Service: General;  Laterality: Right;  GENERAL AND TAP BLOCK  . INSERTION OF MESH Right 06/01/2017   Procedure: INSERTION OF MESH;  Surgeon: Kinsinger, Arta Bruce, MD;  Location: [facility];  Service: General;  Laterality: Right;  GENERAL AND TAP BLOCK     Family History  Problem Relation Age of Onset  . COPD Mother   . Aortic aneurysm Father   . Lung cancer Sister      Social History   Substance and Sexual Activity  Drug Use No  ,  Social History   Substance and Sexual Activity  Alcohol Use No  . Frequency: Never  ,  Social History   Tobacco Use  Smoking Status Never Smoker  Smokeless Tobacco Never Used  ,    Current Outpatient Medications on File Prior to Visit  Medication Sig Dispense Refill  . Linagliptin-metFORMIN HCl (JENTADUETO) 2.10-998 MG TABS Take 1 tablet by mouth 2 (two) times daily. 180 tablet 3  . liraglutide (VICTOZA) 18 MG/3ML SOPN INJECT 0.6 MG SUBCUTANEOUSLY ONCE DAILY FOR 1 WEEK, THEN 1.2 MG ONCE DAILY. 6 mL 0  .  losartan-hydrochlorothiazide (HYZAAR) 100-12.5 MG tablet Take 1 tablet by mouth daily. (Patient taking differently: Take 1 tablet by mouth every morning. ) 90 tablet 3  . metoprolol succinate (TOPROL-XL) 50 MG 24 hr tablet Take 1 tablet (50 mg total) by mouth every morning. 90 tablet 3  .  simvastatin (ZOCOR) 80 MG tablet Take 1 tablet (80 mg total) by mouth at bedtime. 90 tablet 3   No current facility-administered medications on file prior to visit.      No Known Allergies   Review of Systems:   General:  Denies fever, chills Optho/Auditory:   Denies visual changes, blurred vision Respiratory:   Denies SOB, cough, wheeze, DIB  Cardiovascular:   Denies chest pain, palpitations, painful respirations Gastrointestinal:   Denies nausea, vomiting, diarrhea.  Endocrine:     Denies new hot or cold intolerance Musculoskeletal:  Denies joint swelling, gait issues, or new unexplained myalgias/ arthralgias Skin:  Denies rash, suspicious lesions  Neurological:    Denies dizziness, unexplained weakness, numbness  Psychiatric/Behavioral:   Denies mood changes    Objective:     Blood pressure 132/80, pulse (!) 55, height _0  (1.727 m), weight 227 lb 9.6 oz (103.2 kg), SpO2 97 %.  Body mass index is 34.61 kg/m.  General: Well Developed, well nourished, and in no acute distress.  HEENT: Normocephalic, atraumatic, pupils equal round reactive to light, neck supple, No carotid bruits, no JVD Skin: Warm and dry, cap RF less 2 sec Cardiac: Regular rate and rhythm, S1, S2 WNL's, no murmurs rubs or gallops Respiratory: ECTA B/L, Not using accessory muscles, speaking in full sentences. NeuroM-Sk: Ambulates w/o assistance, moves ext * 4 w/o difficulty, sensation grossly intact.  Ext: scant edema b/l lower ext Psych: No HI/SI, judgement and insight good, Euthymic mood. Full Affect.

## 2018-01-01 ENCOUNTER — Other Ambulatory Visit: Payer: Self-pay

## 2018-01-01 ENCOUNTER — Ambulatory Visit (HOSPITAL_COMMUNITY): Payer: 59 | Attending: Cardiovascular Disease

## 2018-01-01 DIAGNOSIS — Z723 Lack of physical exercise: Secondary | ICD-10-CM | POA: Diagnosis not present

## 2018-01-01 DIAGNOSIS — I10 Essential (primary) hypertension: Secondary | ICD-10-CM

## 2018-01-01 DIAGNOSIS — E1169 Type 2 diabetes mellitus with other specified complication: Secondary | ICD-10-CM | POA: Insufficient documentation

## 2018-01-01 DIAGNOSIS — E785 Hyperlipidemia, unspecified: Secondary | ICD-10-CM | POA: Insufficient documentation

## 2018-01-01 DIAGNOSIS — E118 Type 2 diabetes mellitus with unspecified complications: Secondary | ICD-10-CM | POA: Insufficient documentation

## 2018-01-01 DIAGNOSIS — E669 Obesity, unspecified: Secondary | ICD-10-CM

## 2018-01-01 DIAGNOSIS — R011 Cardiac murmur, unspecified: Secondary | ICD-10-CM | POA: Diagnosis not present

## 2018-01-01 DIAGNOSIS — E1159 Type 2 diabetes mellitus with other circulatory complications: Secondary | ICD-10-CM | POA: Diagnosis not present

## 2018-01-01 DIAGNOSIS — Z6834 Body mass index (BMI) 34.0-34.9, adult: Secondary | ICD-10-CM | POA: Insufficient documentation

## 2018-01-01 DIAGNOSIS — I152 Hypertension secondary to endocrine disorders: Secondary | ICD-10-CM

## 2018-01-04 ENCOUNTER — Telehealth: Payer: Self-pay | Admitting: Family Medicine

## 2018-01-04 NOTE — Telephone Encounter (Signed)
Patient called to check on the status results of his Echo taken on 01/01/18--   --Forwarding message to medical assistant to contact patient regarding results.  -glh

## 2018-01-04 NOTE — Telephone Encounter (Signed)
I did not see it in my in box and it is still not there now.  Can you let me know is it something that we ordered?

## 2018-01-04 NOTE — Telephone Encounter (Signed)
Patient requesting results of echocardiography done on 01/01/2018.  Please review and advise. MPulliam, CMA/RT(R)

## 2018-01-08 NOTE — Telephone Encounter (Signed)
Results in the chart and advised Dr. Raliegh Scarlet.  She noted on the the results and patient was notified. MPulliam, CMA/RT(R)

## 2018-01-23 ENCOUNTER — Other Ambulatory Visit: Payer: Self-pay | Admitting: Family Medicine

## 2018-01-23 DIAGNOSIS — E119 Type 2 diabetes mellitus without complications: Secondary | ICD-10-CM

## 2018-02-01 DIAGNOSIS — H2513 Age-related nuclear cataract, bilateral: Secondary | ICD-10-CM | POA: Insufficient documentation

## 2018-02-01 DIAGNOSIS — H02831 Dermatochalasis of right upper eyelid: Secondary | ICD-10-CM | POA: Insufficient documentation

## 2018-02-01 DIAGNOSIS — E119 Type 2 diabetes mellitus without complications: Secondary | ICD-10-CM | POA: Insufficient documentation

## 2018-02-01 DIAGNOSIS — H02834 Dermatochalasis of left upper eyelid: Secondary | ICD-10-CM

## 2018-02-01 DIAGNOSIS — H25013 Cortical age-related cataract, bilateral: Secondary | ICD-10-CM | POA: Insufficient documentation

## 2018-02-20 ENCOUNTER — Encounter: Payer: 59 | Admitting: Family Medicine

## 2018-02-21 ENCOUNTER — Encounter: Payer: 59 | Admitting: Family Medicine

## 2018-03-08 ENCOUNTER — Ambulatory Visit: Payer: 59 | Admitting: Family Medicine

## 2018-03-17 ENCOUNTER — Other Ambulatory Visit: Payer: Self-pay | Admitting: Family Medicine

## 2018-03-17 DIAGNOSIS — E119 Type 2 diabetes mellitus without complications: Secondary | ICD-10-CM

## 2018-03-19 ENCOUNTER — Encounter: Payer: Self-pay | Admitting: Family Medicine

## 2018-03-19 ENCOUNTER — Ambulatory Visit (INDEPENDENT_AMBULATORY_CARE_PROVIDER_SITE_OTHER): Payer: 59 | Admitting: Family Medicine

## 2018-03-19 VITALS — BP 132/83 | HR 54 | Ht 68.0 in | Wt 226.4 lb

## 2018-03-19 DIAGNOSIS — E119 Type 2 diabetes mellitus without complications: Secondary | ICD-10-CM

## 2018-03-19 DIAGNOSIS — E669 Obesity, unspecified: Secondary | ICD-10-CM

## 2018-03-19 DIAGNOSIS — Z723 Lack of physical exercise: Secondary | ICD-10-CM

## 2018-03-19 DIAGNOSIS — E785 Hyperlipidemia, unspecified: Secondary | ICD-10-CM

## 2018-03-19 DIAGNOSIS — I152 Hypertension secondary to endocrine disorders: Secondary | ICD-10-CM

## 2018-03-19 DIAGNOSIS — E1159 Type 2 diabetes mellitus with other circulatory complications: Secondary | ICD-10-CM

## 2018-03-19 DIAGNOSIS — Z1283 Encounter for screening for malignant neoplasm of skin: Secondary | ICD-10-CM

## 2018-03-19 DIAGNOSIS — E1169 Type 2 diabetes mellitus with other specified complication: Secondary | ICD-10-CM | POA: Diagnosis not present

## 2018-03-19 DIAGNOSIS — E118 Type 2 diabetes mellitus with unspecified complications: Secondary | ICD-10-CM | POA: Diagnosis not present

## 2018-03-19 DIAGNOSIS — I10 Essential (primary) hypertension: Secondary | ICD-10-CM

## 2018-03-19 DIAGNOSIS — Z1159 Encounter for screening for other viral diseases: Secondary | ICD-10-CM

## 2018-03-19 DIAGNOSIS — Z0000 Encounter for general adult medical examination without abnormal findings: Secondary | ICD-10-CM | POA: Diagnosis not present

## 2018-03-19 DIAGNOSIS — Z114 Encounter for screening for human immunodeficiency virus [HIV]: Secondary | ICD-10-CM

## 2018-03-19 NOTE — Patient Instructions (Addendum)
As a reminder your A1c was 6.3 back in July.  This is fantastic.  Please keep it up and please be checking your blood sugars.  Please follow-up every 3 months for management of your diabetes, hypertension etc. etc.   Preventive Care for Adults, Male A healthy lifestyle and preventive care can promote health and wellness. Preventive health guidelines for men include the following key practices:  A routine yearly physical is a good way to check with your health care provider about your health and preventative screening. It is a chance to share any concerns and updates on your health and to receive a thorough exam.  Visit your dentist for a routine exam and preventative care every 6 months. Brush your teeth twice a day and floss once a day. Good oral hygiene prevents tooth decay and gum disease.  The frequency of eye exams is based on your age, health, family medical history, use of contact lenses, and other factors. Follow your health care provider's recommendations for frequency of eye exams.  Eat a healthy diet. Foods such as vegetables, fruits, whole grains, low-fat dairy products, and lean protein foods contain the nutrients you need without too many calories. Decrease your intake of foods high in solid fats, added sugars, and salt. Eat the right amount of calories for you.Get information about a proper diet from your health care provider, if necessary.  Regular physical exercise is one of the most important things you can do for your health. Most adults should get at least 150 minutes of moderate-intensity exercise (any activity that increases your heart rate and causes you to sweat) each week. In addition, most adults need muscle-strengthening exercises on 2 or more days a week.  Maintain a healthy weight. The body mass index (BMI) is a screening tool to identify possible weight problems. It provides an estimate of body fat based on height and weight. Your health care provider can find your BMI  and can help you achieve or maintain a healthy weight.For adults 20 years and older:  A BMI below 18.5 is considered underweight.  A BMI of 18.5 to 24.9 is normal.  A BMI of 25 to 29.9 is considered overweight.  A BMI of 30 and above is considered obese.  Maintain normal blood lipids and cholesterol levels by exercising and minimizing your intake of saturated fat. Eat a balanced diet with plenty of fruit and vegetables. Blood tests for lipids and cholesterol should begin at age 25 and be repeated every 5 years. If your lipid or cholesterol levels are high, you are over 50, or you are at high risk for heart disease, you may need your cholesterol levels checked more frequently.Ongoing high lipid and cholesterol levels should be treated with medicines if diet and exercise are not working.  If you smoke, find out from your health care provider how to quit. If you do not use tobacco, do not start.  Lung cancer screening is recommended for adults aged 11-80 years who are at high risk for developing lung cancer because of a history of smoking. A yearly low-dose CT scan of the lungs is recommended for people who have at least a 30-pack-year history of smoking and are a current smoker or have quit within the past 15 years. A pack year of smoking is smoking an average of 1 pack of cigarettes a day for 1 year (for example: 1 pack a day for 30 years or 2 packs a day for 15 years). Yearly screening should continue until  the smoker has stopped smoking for at least 15 years. Yearly screening should be stopped for people who develop a health problem that would prevent them from having lung cancer treatment.  If you choose to drink alcohol, do not have more than 2 drinks per day. One drink is considered to be 12 ounces (355 mL) of beer, 5 ounces (148 mL) of wine, or 1.5 ounces (44 mL) of liquor.  Avoid use of street drugs. Do not share needles with anyone. Ask for help if you need support or instructions about  stopping the use of drugs.  High blood pressure causes heart disease and increases the risk of stroke. Your blood pressure should be checked at least every 1-2 years. Ongoing high blood pressure should be treated with medicines, if weight loss and exercise are not effective.  If you are 61-33 years old, ask your health care provider if you should take aspirin to prevent heart disease.  Diabetes screening is done by taking a blood sample to check your blood glucose level after you have not eaten for a certain period of time (fasting). If you are not overweight and you do not have risk factors for diabetes, you should be screened once every 3 years starting at age 59. If you are overweight or obese and you are 19-35 years of age, you should be screened for diabetes every year as part of your cardiovascular risk assessment.  Colorectal cancer can be detected and often prevented. Most routine colorectal cancer screening begins at the age of 69 and continues through age 58. However, your health care provider may recommend screening at an earlier age if you have risk factors for colon cancer. On a yearly basis, your health care provider may provide home test kits to check for hidden blood in the stool. Use of a small camera at the end of a tube to directly examine the colon (sigmoidoscopy or colonoscopy) can detect the earliest forms of colorectal cancer. Talk to your health care provider about this at age 56, when routine screening begins. Direct exam of the colon should be repeated every 5-10 years through age 62, unless early forms of precancerous polyps or small growths are found.  People who are at an increased risk for hepatitis B should be screened for this virus. You are considered at high risk for hepatitis B if:  You were born in a country where hepatitis B occurs often. Talk with your health care provider about which countries are considered high risk.  Your parents were born in a high-risk  country and you have not received a shot to protect against hepatitis B (hepatitis B vaccine).  You have HIV or AIDS.  You use needles to inject street drugs.  You live with, or have sex with, someone who has hepatitis B.  You are a man who has sex with other men (MSM).  You get hemodialysis treatment.  You take certain medicines for conditions such as cancer, organ transplantation, and autoimmune conditions.  Hepatitis C blood testing is recommended for all people born from [birth year] through 1965 and any individual with known risks for hepatitis C.  Practice safe sex. Use condoms and avoid high-risk sexual practices to reduce the spread of sexually transmitted infections (STIs). STIs include gonorrhea, chlamydia, syphilis, trichomonas, herpes, HPV, and human immunodeficiency virus (HIV). Herpes, HIV, and HPV are viral illnesses that have no cure. They can result in disability, cancer, and death.  If you are a man who has sex with other  men, you should be screened at least once per year for:  HIV.  Urethral, rectal, and pharyngeal infection of gonorrhea, chlamydia, or both.  If you are at risk of being infected with HIV, it is recommended that you take a prescription medicine daily to prevent HIV infection. This is called preexposure prophylaxis (PrEP). You are considered at risk if:  You are a man who has sex with other men (MSM) and have other risk factors.  You are a heterosexual man, are sexually active, and are at increased risk for HIV infection.  You take drugs by injection.  You are sexually active with a partner who has HIV.  Talk with your health care provider about whether you are at high risk of being infected with HIV. If you choose to begin PrEP, you should first be tested for HIV. You should then be tested every 3 months for as long as you are taking PrEP.  A one-time screening for abdominal aortic aneurysm (AAA) and surgical repair of large AAAs by ultrasound are  recommended for men ages 70 to 86 years who are current or former smokers.  Healthy men should no longer receive prostate-specific antigen (PSA) blood tests as part of routine cancer screening. Talk with your health care provider about prostate cancer screening.  Testicular cancer screening is not recommended for adult males who have no symptoms. Screening includes self-exam, a health care provider exam, and other screening tests. Consult with your health care provider about any symptoms you have or any concerns you have about testicular cancer.  Use sunscreen. Apply sunscreen liberally and repeatedly throughout the day. You should seek shade when your shadow is shorter than you. Protect yourself by wearing long sleeves, pants, a wide-brimmed hat, and sunglasses year round, whenever you are outdoors.  Once a month, do a whole-body skin exam, using a mirror to look at the skin on your back. Tell your health care provider about new moles, moles that have irregular borders, moles that are larger than a pencil eraser, or moles that have changed in shape or color.  Stay current with required vaccines (immunizations).  Influenza vaccine. All adults should be immunized every year.  Tetanus, diphtheria, and acellular pertussis (Td, Tdap) vaccine. An adult who has not previously received Tdap or who does not know his vaccine status should receive 1 dose of Tdap. This initial dose should be followed by tetanus and diphtheria toxoids (Td) booster doses every 10 years. Adults with an unknown or incomplete history of completing a 3-dose immunization series with Td-containing vaccines should begin or complete a primary immunization series including a Tdap dose. Adults should receive a Td booster every 10 years.  Varicella vaccine. An adult without evidence of immunity to varicella should receive 2 doses or a second dose if he has previously received 1 dose.  Human papillomavirus (HPV) vaccine. Males aged 11-21  years who have not received the vaccine previously should receive the 3-dose series. Males aged 22-26 years may be immunized. Immunization is recommended through the age of 1 years for any male who has sex with males and did not get any or all doses earlier. Immunization is recommended for any person with an immunocompromised condition through the age of 88 years if he did not get any or all doses earlier. During the 3-dose series, the second dose should be obtained 4-8 weeks after the first dose. The third dose should be obtained 24 weeks after the first dose and 16 weeks after the second dose.  Zoster vaccine. One dose is recommended for adults aged 53 years or older unless certain conditions are present.  Measles, mumps, and rubella (MMR) vaccine. Adults born before [birth year] generally are considered immune to measles and mumps. Adults born in [birth year] or later should have 1 or more doses of MMR vaccine unless there is a contraindication to the vaccine or there is laboratory evidence of immunity to each of the three diseases. A routine second dose of MMR vaccine should be obtained at least 28 days after the first dose for students attending postsecondary schools, health care workers, or international travelers. People who received inactivated measles vaccine or an unknown type of measles vaccine during 1963-1967 should receive 2 doses of MMR vaccine. People who received inactivated mumps vaccine or an unknown type of mumps vaccine before 1979 and are at high risk for mumps infection should consider immunization with 2 doses of MMR vaccine. Unvaccinated health care workers born before [birth year] who lack laboratory evidence of measles, mumps, or rubella immunity or laboratory confirmation of disease should consider measles and mumps immunization with 2 doses of MMR vaccine or rubella immunization with 1 dose of MMR vaccine.  Pneumococcal 13-valent conjugate (PCV13) vaccine. When indicated, a person who is uncertain of his  immunization history and has no record of immunization should receive the PCV13 vaccine. All adults 23 years of age and older should receive this vaccine. An adult aged 81 years or older who has certain medical conditions and has not been previously immunized should receive 1 dose of PCV13 vaccine. This PCV13 should be followed with a dose of pneumococcal polysaccharide (PPSV23) vaccine. Adults who are at high risk for pneumococcal disease should obtain the PPSV23 vaccine at least 8 weeks after the dose of PCV13 vaccine. Adults older than 57 years of age who have normal immune system function should obtain the PPSV23 vaccine dose at least 1 year after the dose of PCV13 vaccine.  Pneumococcal polysaccharide (PPSV23) vaccine. When PCV13 is also indicated, PCV13 should be obtained first. All adults aged 62 years and older should be immunized. An adult younger than age 25 years who has certain medical conditions should be immunized. Any person who resides in a nursing home or long-term care facility should be immunized. An adult smoker should be immunized. People with an immunocompromised condition and certain other conditions should receive both PCV13 and PPSV23 vaccines. People with human immunodeficiency virus (HIV) infection should be immunized as soon as possible after diagnosis. Immunization during chemotherapy or radiation therapy should be avoided. Routine use of PPSV23 vaccine is not recommended for American Indians, [place] Natives, or people younger than 65 years unless there are medical conditions that require PPSV23 vaccine. When indicated, people who have unknown immunization and have no record of immunization should receive PPSV23 vaccine. One-time revaccination 5 years after the first dose of PPSV23 is recommended for people aged 19-64 years who have chronic kidney failure, nephrotic syndrome, asplenia, or immunocompromised conditions. People who received 1-2 doses of PPSV23 before age 64 years should  receive another dose of PPSV23 vaccine at age 69 years or later if at least 5 years have passed since the previous dose. Doses of PPSV23 are not needed for people immunized with PPSV23 at or after age 51 years.  Meningococcal vaccine. Adults with asplenia or persistent complement component deficiencies should receive 2 doses of quadrivalent meningococcal conjugate (MenACWY-D) vaccine. The doses should be obtained at least 2 months apart. Microbiologists working with certain meningococcal bacteria, [employer] recruits, people at  risk during an outbreak, and people who travel to or live in countries with a high rate of meningitis should be immunized. A first-year college student up through age 29 years who is living in a residence hall should receive a dose if he did not receive a dose on or after his 16th birthday. Adults who have certain high-risk conditions should receive one or more doses of vaccine.  Hepatitis A vaccine. Adults who wish to be protected from this disease, have chronic liver disease, work with hepatitis A-infected animals, work in hepatitis A research labs, or travel to or work in countries with a high rate of hepatitis A should be immunized. Adults who were previously unvaccinated and who anticipate close contact with an international adoptee during the first 60 days after arrival in the [place] from a country with a high rate of hepatitis A should be immunized.  Hepatitis B vaccine. Adults should be immunized if they wish to be protected from this disease, are under age 60 years and have diabetes, have chronic liver disease, have had more than one sex partner in the past 6 months, may be exposed to blood or other infectious body fluids, are household contacts or sex partners of hepatitis B positive people, are clients or workers in certain care facilities, or travel to or work in countries with a high rate of hepatitis B.  Haemophilus influenzae type b (Hib) vaccine. A previously  unvaccinated person with asplenia or sickle cell disease or having a scheduled splenectomy should receive 1 dose of Hib vaccine. Regardless of previous immunization, a recipient of a hematopoietic stem cell transplant should receive a 3-dose series 6-12 months after his successful transplant. Hib vaccine is not recommended for adults with HIV infection. Preventive Service / Frequency Ages 31 to 59  Blood pressure check.** / Every 3-5 years.  Lipid and cholesterol check.** / Every 5 years beginning at age 31.  Hepatitis C blood test.** / For any individual with known risks for hepatitis C.  Skin self-exam. / Monthly.  Influenza vaccine. / Every year.  Tetanus, diphtheria, and acellular pertussis (Tdap, Td) vaccine.** / Consult your health care provider. 1 dose of Td every 10 years.  Varicella vaccine.** / Consult your health care provider.  HPV vaccine. / 3 doses over 6 months, if 58 or younger.  Measles, mumps, rubella (MMR) vaccine.** / You need at least 1 dose of MMR if you were born in 1957 or later. You may also need a second dose.  Pneumococcal 13-valent conjugate (PCV13) vaccine.** / Consult your health care provider.  Pneumococcal polysaccharide (PPSV23) vaccine.** / 1 to 2 doses if you smoke cigarettes or if you have certain conditions.  Meningococcal vaccine.** / 1 dose if you are age 85 to 40 years and a Market researcher living in a residence hall, or have one of several medical conditions. You may also need additional booster doses.  Hepatitis A vaccine.** / Consult your health care provider.  Hepatitis B vaccine.** / Consult your health care provider.  Haemophilus influenzae type b (Hib) vaccine.** / Consult your health care provider. Ages 65 to 58  Blood pressure check.** / Every year.  Lipid and cholesterol check.** / Every 5 years beginning at age 66.  Lung cancer screening. / Every year if you are aged 4-80 years and have a 30-pack-year history of  smoking and currently smoke or have quit within the past 15 years. Yearly screening is stopped once you have quit smoking for at least 15  years or develop a health problem that would prevent you from having lung cancer treatment.  Fecal occult blood test (FOBT) of stool. / Every year beginning at age 86 and continuing until age 22. You may not have to do this test if you get a colonoscopy every 10 years.  Flexible sigmoidoscopy** or colonoscopy.** / Every 5 years for a flexible sigmoidoscopy or every 10 years for a colonoscopy beginning at age 60 and continuing until age 37.  Hepatitis C blood test.** / For all people born from [birth year] through 1965 and any individual with known risks for hepatitis C.  Skin self-exam. / Monthly.  Influenza vaccine. / Every year.  Tetanus, diphtheria, and acellular pertussis (Tdap/Td) vaccine.** / Consult your health care provider. 1 dose of Td every 10 years.  Varicella vaccine.** / Consult your health care provider.  Zoster vaccine.** / 1 dose for adults aged 95 years or older.  Measles, mumps, rubella (MMR) vaccine.** / You need at least 1 dose of MMR if you were born in 1957 or later. You may also need a second dose.  Pneumococcal 13-valent conjugate (PCV13) vaccine.** / Consult your health care provider.  Pneumococcal polysaccharide (PPSV23) vaccine.** / 1 to 2 doses if you smoke cigarettes or if you have certain conditions.  Meningococcal vaccine.** / Consult your health care provider.  Hepatitis A vaccine.** / Consult your health care provider.  Hepatitis B vaccine.** / Consult your health care provider.  Haemophilus influenzae type b (Hib) vaccine.** / Consult your health care provider. Ages 42 and over  Blood pressure check.** / Every year.  Lipid and cholesterol check.**/ Every 5 years beginning at age 35.  Lung cancer screening. / Every year if you are aged 68-80 years and have a 30-pack-year history of smoking and currently smoke or have  quit within the past 15 years. Yearly screening is stopped once you have quit smoking for at least 15 years or develop a health problem that would prevent you from having lung cancer treatment.  Fecal occult blood test (FOBT) of stool. / Every year beginning at age 69 and continuing until age 38. You may not have to do this test if you get a colonoscopy every 10 years.  Flexible sigmoidoscopy** or colonoscopy.** / Every 5 years for a flexible sigmoidoscopy or every 10 years for a colonoscopy beginning at age 1 and continuing until age 40.  Hepatitis C blood test.** / For all people born from [birth year] through 1965 and any individual with known risks for hepatitis C.  Abdominal aortic aneurysm (AAA) screening.** / A one-time screening for ages 37 to 53 years who are current or former smokers.  Skin self-exam. / Monthly.  Influenza vaccine. / Every year.  Tetanus, diphtheria, and acellular pertussis (Tdap/Td) vaccine.** / 1 dose of Td every 10 years.  Varicella vaccine.** / Consult your health care provider.  Zoster vaccine.** / 1 dose for adults aged 42 years or older.  Pneumococcal 13-valent conjugate (PCV13) vaccine.** / 1 dose for all adults aged 65 years and older.  Pneumococcal polysaccharide (PPSV23) vaccine.** / 1 dose for all adults aged 10 years and older.  Meningococcal vaccine.** / Consult your health care provider.  Hepatitis A vaccine.** / Consult your health care provider.  Hepatitis B vaccine.** / Consult your health care provider.  Haemophilus influenzae type b (Hib) vaccine.** / Consult your health care provider. **Family history and personal history of risk and conditions may change your health care provider's recommendations.   This information is not  intended to replace advice given to you by your health care provider. Make sure you discuss any questions you have with your health care provider.   Document Released: 08/02/2001 Document Revised: 06/27/2014 Document  Reviewed: 11/01/2010 [employer]vier Interactive Patient Education [employer].

## 2018-03-19 NOTE — Progress Notes (Signed)
Male physical  Impression and Recommendations:    1. Encounter for wellness examination   2. Type 2 diabetes mellitus with complication, without long-term current use of insulin ([place])   3. Type 2 diabetes mellitus without retinopathy ([place])   4. Hyperlipidemia associated with type 2 diabetes mellitus ([place])   5. Hypertension associated with diabetes ([place])   6. Screening for skin cancer   7. Encounter for hepatitis C screening test for low risk patient   8. Screening for HIV (human immunodeficiency virus)   9. Inactivity   10. Obesity, Class I, BMI 30-34.9     Dermatology -Referred pt to dermatology -Discussed skin mapping and health guidelines for dermatology checks -Encouraged pt to wear wide brim, SPF hats with outdoor exposure  -Discussed red flag changes like bleeding, uneven borders, darkening spots  Colonoscopy -Discussed health guidelines for colonoscopy screenings after 57 yo -Discussed stool card checks between appointments for colonoscopies  Eyes -encouraged pt to continue seeing his diabetic eye doctor regularly   Dental  -Discussed health guidelines for regular dental care -Encouraged pt to maintain regular dental care  GI -Discussed the importance in water consumption for preventing  -Encouraged pt to avoid drinking or eating within 3 hours of bed time -Discussed aggravators of acid reflux in case pt begins experiencing symptoms  Sexual Health -Discussed sexual health symptoms and pt concerns -Educated pt on affect of fatigue and age on desire -Encouraged pt to create times for intimacy on weekends and days off to help with desire -Discussed methods for testicular self exams and red flag symptoms -Discussed methods for self cleaning beneath foreskin  -Discussed need for regular sexual use to maintain function   Anticipatory Guidance: Discussed importance of wearing a seatbelt while driving, not texting while driving;   sunscreen when outside along with  skin surveillance; eating a balanced and modest diet; physical activity at least 25 minutes per day or 150 min/ week moderate to intense activity.  Immunizations / Screenings / Labs: -Educated pt on shingrix for prevention of shingles symptoms -Encouraged pt to contact insurance for coverage  All immunizations are up-to-date per recommendations or will be updated today. Patient is due for dental and vision screens which pt will schedule independently. Will obtain CBC, CMP, HgA1c, Lipid panel, TSH and vit D when fasting, if not already done recently.   Weight:  BMI meaning discussed with patient.  Discussed goal of losing 5-10% of current body weight which would improve overall feelings of well being and improve objective health data. Improve nutrient density of diet through increasing intake of fruits and vegetables and decreasing saturated fats, white flour products and refined sugars.     Gross side effects, risk and benefits, and alternatives of medications discussed with patient.  Patient is aware that all medications have potential side effects and we are unable to predict every side effect or drug-drug interaction that may occur.  Expresses verbal understanding and consents to current therapy plan and treatment regimen. Please see AVS handed out to patient at the end of our visit for further patient instructions/ counseling done pertaining to today's office visit.  Follow-up preventative CPE in 1 year. Follow-up office visit pending lab work.  F/up sooner for chronic care management and/or prn  This document serves as a record of services personally performed by Mellody Dance, MD. It was created on her behalf by Georga Bora, a trained medical scribe. The creation of this record is based on the scribe's personal observations and the  provider's statements to them.   I have reviewed the above medical documentation for accuracy and completeness and I concur.  Mellody Dance 04/23/18 8:09 AM     Subjective:    CC: CPE  HPI: Russell Robles is a 57 y.o. male who presents to [place] at [facility] today for a yearly health maintenance exam.     Health Maintenance Summary Reviewed and updated, unless pt declines services.  Urinary/genital -Pt states he only uses the bathroom maybe once per night -Has water near his bedside  GI -Pt denies nausea, GI upset, diarrhea, pain  -Pt denies issues with acid reflux -PT states he does do regular self exams on his testicles  Colonoscopy -States he has had one in the past, he believes it was in [place] at [place] he got it right after turning 50, no abnormal findings  Screenings -States he had Hepatitis C testing during his last job but it was a few years ago -Agreed to hepatitis C and HIV screening  Sexual Health -Pt states he hasn't been having issues with sexual function -Only complaint is his wife wants more frequent sex -States his believes his lack of desire due to age and fatigue from working  Dental -Pt states he canceled his last dental appointment due to work conflicts and never made another -Denies regular dental checks  Dermatology -Pt does not have dermatology consults -Has not been conducting skin checks  Eyes -Had diabetic eye exam in September 2018 -Denies retinopathy  Colonoscopy:  Per pt, he had a colonoscopy with normal findings roughly 5 years ago Tobacco History Reviewed:  No smoking history CT scan for screening lung CA:   No smoking history Alcohol:    No concerns, no excessive use Exercise Habits: not meeting AHA guidelines STD concerns:   none Drug Use:   None Testicular/penile concerns: none   Immunization History  Administered Date(s) Administered  . Influenza,inj,Quad PF,6+ Mos 08/01/2017  . Influenza-Unspecified 03/19/2014, 06/19/2015  . Tdap 06/08/2012    Health Maintenance  Topic Date Due  . INFLUENZA VACCINE  01/18/2018  .  OPHTHALMOLOGY EXAM  08/01/2018 (Originally 04/24/1971)  . COLONOSCOPY  08/01/2018 (Originally 04/24/2011)  . PNEUMOCOCCAL POLYSACCHARIDE VACCINE AGE 15-64 HIGH RISK  03/20/2019 (Originally 04/24/1963)  . FOOT EXAM  08/01/2018  . HEMOGLOBIN A1C  09/17/2018  . TETANUS/TDAP  06/08/2022  . Hepatitis C Screening  Completed  . HIV Screening  Completed      Wt Readings from Last 3 Encounters:  04/16/18 222 lb (100.7 kg)  03/19/18 226 lb 6.4 oz (102.7 kg)  12/19/17 227 lb 9.6 oz (103.2 kg)   BP Readings from Last 3 Encounters:  04/16/18 124/75  03/19/18 132/83  12/19/17 132/80   Pulse Readings from Last 3 Encounters:  04/16/18 67  03/19/18 (!) 54  12/19/17 (!) 55    Patient Active Problem List   Diagnosis Date Noted  . Diabetes mellitus type 2 with complications ([place]) 62/70/3500    Priority: High  . Hypertension associated with diabetes ([place]) 06/18/2015    Priority: High  . Hyperlipidemia associated with type 2 diabetes mellitus ([place]) 06/18/2015    Priority: High  . Obesity, Class I, BMI 30-34.9 04/12/2017    Priority: Medium  . Inactivity 04/12/2017    Priority: Low  . Age-related nuclear cataract of both eyes 06/18/2015    Priority: Low  . Impingement syndrome of shoulder region, left 04/16/2018  . Bursitis of shoulder, left 04/16/2018  . Cortical age-related  cataract of both eyes 02/01/2018  . Dermatochalasis of both upper eyelids 02/01/2018  . Nuclear sclerotic cataract of both eyes 02/01/2018  . Type 2 diabetes mellitus without retinopathy ([place]) 02/01/2018  . new onset Murmur, heart 12/19/2017  . Acute pain of left shoulder 10/17/2017  . Presbyopia 06/18/2015  . Inguinal hernia unilateral, non-recurrent 12/13/2010    Past Medical History:  Diagnosis Date  . Cough    PER PT NON-PRODUCTIVE , NO FEVER OR CONGESTION  . Hernia, inguinal, right   . Hyperlipidemia   . Hypertension   . Type 2 diabetes mellitus ([place])    last  HbA1c 7.2 on 05-09-2017 in epic    Past  Surgical History:  Procedure Laterality Date  . INGUINAL HERNIA REPAIR Left 12-16-2010  dr Donne Hazel  [facility]  . INGUINAL HERNIA REPAIR Right 06/01/2017   Procedure: OPEN RIGHT INGUINAL HERNIA REPAIR WITH MESH;  Surgeon: Kinsinger, Arta Bruce, MD;  [facility];  Service: General;  Laterality: Right;  GENERAL AND TAP BLOCK  . INSERTION OF MESH Right 06/01/2017   Procedure: INSERTION OF MESH;  Surgeon: Kinsinger, Arta Bruce, MD;  Location: [facility];  Service: General;  Laterality: Right;  GENERAL AND TAP BLOCK    Family History  Problem Relation Age of Onset  . COPD Mother   . Aortic aneurysm Father   . Lung cancer Sister     Social History   Substance and Sexual Activity  Drug Use No  ,  Social History   Substance and Sexual Activity  Alcohol Use No  . Frequency: Never  ,  Social History   Tobacco Use  Smoking Status Never Smoker  Smokeless Tobacco Never Used  ,  Social History   Substance and Sexual Activity  Sexual Activity Yes  . Partners: Female  . Birth control/protection: None    Patient's Medications  New Prescriptions   No medications on file  Previous Medications   LINAGLIPTIN-METFORMIN HCL (JENTADUETO) 2.10-998 MG TABS    Take 1 tablet by mouth 2 (two) times daily.   LOSARTAN-HYDROCHLOROTHIAZIDE (HYZAAR) 100-12.5 MG TABLET    Take 1 tablet by mouth daily.   METOPROLOL SUCCINATE (TOPROL-XL) 50 MG 24 HR TABLET    Take 1 tablet (50 mg total) by mouth every morning.   SIMVASTATIN (ZOCOR) 80 MG TABLET    Take 1 tablet (80 mg total) by mouth at bedtime.  Modified Medications   Modified Medication Previous Medication   LIRAGLUTIDE (VICTOZA) 18 MG/3ML SOPN liraglutide (VICTOZA) 18 MG/3ML SOPN      Inject 0.2 mLs (1.2 mg total) into the skin daily. INJECT 0.6 MG SUBCUTANEOUSLY ONCE DAILY FOR 1 WEEK, THEN 1.2 MG ONCE DAILY.    INJECT 0.6 MG SUBCUTANEOUSLY ONCE DAILY FOR 1 WEEK, THEN 1.2 MG ONCE DAILY.  Discontinued  Medications   No medications on file    Patient has no known allergies.  Review of Systems: General:   Denies fever, chills, unexplained weight loss.  Optho/Auditory:   Denies visual changes, blurred vision/LOV Respiratory:   Denies SOB, DOE more than baseline levels.  Cardiovascular:   Denies chest pain, palpitations, new onset peripheral edema  Gastrointestinal:   Denies nausea, vomiting, diarrhea.  Genitourinary: Denies dysuria, freq/ urgency, flank pain or discharge from genitals.  Endocrine:     Denies hot or cold intolerance, polyuria, polydipsia. Musculoskeletal:   Denies unexplained myalgias, joint swelling, unexplained arthralgias, gait problems.  Skin:  Denies rash, suspicious lesions Neurological:     Denies  dizziness, unexplained weakness, numbness  Psychiatric/Behavioral:   Denies mood changes, suicidal or homicidal ideations, hallucinations    Objective:     Blood pressure 132/83, pulse (!) 54, height 5\' 8"  (1.727 m), weight 226 lb 6.4 oz (102.7 kg), SpO2 95 %. Body mass index is 34.42 kg/m. General Appearance:    Alert, cooperative, no distress, appears stated age  Head:    Normocephalic, without obvious abnormality, atraumatic  Eyes:    PERRL, conjunctiva/corneas clear, EOM's intact, fundi    benign, both eyes  Ears:    Normal TM's and external ear canals, both ears  Nose:   Nares normal, septum midline, mucosa normal, no drainage    or sinus tenderness  Throat:   Lips w/o lesion, mucosa moist, and tongue normal; teeth and   gums normal  Neck:   Supple, symmetrical, trachea midline, no adenopathy;    thyroid:  no enlargement/tenderness/nodules; no carotid   bruit or JVD  Back:     Symmetric, no curvature, ROM normal, no CVA tenderness  Lungs:     Clear to auscultation bilaterally, respirations unlabored, no       Wh/ R/ R  Chest Wall:    No tenderness or gross deformity; normal excursion   Heart:    Regular rate and rhythm, S1 and S2 normal, no murmur, rub    or gallop  Abdomen:     Soft, non-tender, bowel sounds active all four quadrants, NO   G/R/R, no masses, no organomegaly  Genitalia:    Ext genitalia: without lesion, no penile rash or discharge, no hernias appreciated  -Inguinal scar on the right due to hernial repair -Very well healed inguinal scar due on the right due to hernial repair  Rectal:    Normal tone, prostate WNL's and equal b/l, no tenderness; guaiac negative stool  Extremities:   Extremities normal, atraumatic, no cyanosis or gross edema  Pulses:   2+ and symmetric all extremities  Skin:   Warm, dry, Skin color, texture, turgor normal, no obvious rashes or lesions  M-Sk:   Ambulates * 4 w/o difficulty, no gross deformities, tone WNL  Neurologic:   CNII-XII intact, normal strength, sensation and reflexes    Throughout Psych:  No HI/SI, judgement and insight good, Euthymic mood. Full Affect.

## 2018-03-20 LAB — COMPREHENSIVE METABOLIC PANEL
ALT: 19 IU/L (ref 0–44)
AST: 14 IU/L (ref 0–40)
Albumin/Globulin Ratio: 1.8 (ref 1.2–2.2)
Albumin: 4.4 g/dL (ref 3.5–5.5)
Alkaline Phosphatase: 54 IU/L (ref 39–117)
BUN/Creatinine Ratio: 14 (ref 9–20)
BUN: 13 mg/dL (ref 6–24)
Bilirubin Total: 0.2 mg/dL (ref 0.0–1.2)
CO2: 23 mmol/L (ref 20–29)
Calcium: 9.6 mg/dL (ref 8.7–10.2)
Chloride: 103 mmol/L (ref 96–106)
Creatinine, Ser: 0.9 mg/dL (ref 0.76–1.27)
GFR calc Af Amer: 110 mL/min/{1.73_m2} (ref >59)
GFR calc non Af Amer: 95 mL/min/{1.73_m2} (ref >59)
Globulin, Total: 2.5 g/dL (ref 1.5–4.5)
Glucose: 168 mg/dL — ABNORMAL HIGH (ref 65–99)
Potassium: 4.2 mmol/L (ref 3.5–5.2)
Sodium: 142 mmol/L (ref 134–144)
Total Protein: 6.9 g/dL (ref 6.0–8.5)

## 2018-03-20 LAB — CBC WITH DIFFERENTIAL/PLATELET
Basophils Absolute: 0.1 10*3/uL (ref 0.0–0.2)
Basos: 1 %
EOS (ABSOLUTE): 0.4 10*3/uL (ref 0.0–0.4)
Eos: 5 %
Hematocrit: 41.1 % (ref 37.5–51.0)
Hemoglobin: 14.1 g/dL (ref 13.0–17.7)
Immature Grans (Abs): 0 10*3/uL (ref 0.0–0.1)
Immature Granulocytes: 1 %
Lymphocytes Absolute: 3 10*3/uL (ref 0.7–3.1)
Lymphs: 36 %
MCH: 29 pg (ref 26.6–33.0)
MCHC: 34.3 g/dL (ref 31.5–35.7)
MCV: 84 fL (ref 79–97)
Monocytes Absolute: 0.8 10*3/uL (ref 0.1–0.9)
Monocytes: 10 %
Neutrophils Absolute: 3.9 10*3/uL (ref 1.4–7.0)
Neutrophils: 47 %
Platelets: 257 10*3/uL (ref 150–450)
RBC: 4.87 x10E6/uL (ref 4.14–5.80)
RDW: 12.6 % (ref 12.3–15.4)
WBC: 8.3 10*3/uL (ref 3.4–10.8)

## 2018-03-20 LAB — LIPID PANEL
Chol/HDL Ratio: 3.9 ratio (ref 0.0–5.0)
Cholesterol, Total: 150 mg/dL (ref 100–199)
HDL: 38 mg/dL — ABNORMAL LOW (ref >39)
LDL Calculated: 72 mg/dL (ref 0–99)
Triglycerides: 198 mg/dL — ABNORMAL HIGH (ref 0–149)
VLDL Cholesterol Cal: 40 mg/dL (ref 5–40)

## 2018-03-20 LAB — HEMOGLOBIN A1C
Est. average glucose Bld gHb Est-mCnc: 140 mg/dL
Hgb A1c MFr Bld: 6.5 % — ABNORMAL HIGH (ref 4.8–5.6)

## 2018-03-20 LAB — T4, FREE: Free T4: 1 ng/dL (ref 0.82–1.77)

## 2018-03-20 LAB — HEPATITIS C ANTIBODY: Hep C Virus Ab: <0.1 s/co ratio (ref 0.0–0.9)

## 2018-03-20 LAB — VITAMIN D 25 HYDROXY (VIT D DEFICIENCY, FRACTURES): Vit D, 25-Hydroxy: 26.6 ng/mL — ABNORMAL LOW (ref 30.0–100.0)

## 2018-03-20 LAB — TSH: TSH: 1.56 u[IU]/mL (ref 0.450–4.500)

## 2018-03-20 LAB — HIV ANTIBODY (ROUTINE TESTING W REFLEX): HIV Screen 4th Generation wRfx: NONREACTIVE

## 2018-04-16 ENCOUNTER — Encounter: Payer: Self-pay | Admitting: Family Medicine

## 2018-04-16 ENCOUNTER — Ambulatory Visit (INDEPENDENT_AMBULATORY_CARE_PROVIDER_SITE_OTHER): Payer: 59 | Admitting: Family Medicine

## 2018-04-16 VITALS — BP 124/75 | HR 67 | Wt 222.0 lb

## 2018-04-16 DIAGNOSIS — M7552 Bursitis of left shoulder: Secondary | ICD-10-CM | POA: Diagnosis not present

## 2018-04-16 DIAGNOSIS — W19XXXA Unspecified fall, initial encounter: Secondary | ICD-10-CM | POA: Diagnosis not present

## 2018-04-16 DIAGNOSIS — M7542 Impingement syndrome of left shoulder: Secondary | ICD-10-CM | POA: Diagnosis not present

## 2018-04-16 DIAGNOSIS — M25512 Pain in left shoulder: Secondary | ICD-10-CM | POA: Diagnosis not present

## 2018-04-16 NOTE — Patient Instructions (Signed)
Told patient to ice front and back of his shoulder for 15 minutes every hour  He will continue with Tylenol as needed for pain as it is controlling it well.  Told patient to do circles with his shoulder in a leaned over position using a 2-1/2 pound weight.  Recommend he do circles both ways as well as pendulum exercises.  3 times a day for 5 minutes or so  -He is going to see orthopedics acutely tomorrow-  Dr. Ronnald Ramp at [facility] at [employer] tomorrow

## 2018-04-16 NOTE — Progress Notes (Signed)
Impression and Recommendations:    1. Acute pain of left shoulder   2. Impingement syndrome of shoulder region, left   3. Bursitis of shoulder, left   4. Fall, initial encounter     Acute pain of left shoulder - Plan: Ambulatory referral to Orthopedic Surgery  Impingement syndrome of shoulder region, left - Plan: Ambulatory referral to Orthopedic Surgery  Bursitis of shoulder, left - Plan: Ambulatory referral to Orthopedic Surgery  Fall, initial encounter - Plan: Ambulatory referral to Orthopedic Surgery  Shoulder Pain Told patient to ice front and back of his shoulder for 15 minutes every hour  He will continue with Tylenol as needed for pain as it is controlling it well.  Told patient to do circles with his shoulder in a leaned over position using a 2-1/2 pound weight.  Recommend he do circles both ways as well as pendulum exercises.  3 times a day for 5 minutes or so  -He is going to see orthopedics acutely tomorrow-  Dr. Ronnald Ramp at [facility] at [employer] tomorrow  -Explained that pt needs further evaluation by orthopedists to expedite plan of care/ txmnt -Encouraged pt to purchase a sling if he feels his arm is in pain due to being unsupported - Discussed the risks benefits of this with patient and importance of maintaining joint mobility of the shoulder area regardless of pathology. -  Do shoulder circles and explained these exercises help prevent capsular adhesions and prevent tightness - NSAIDS when necessary; risk benefits discussed with patient. - instructed pt to ice the front and back of shoulder for 15-20 minutes up to freq of each hour   Education and routine counseling performed. Handouts provided.  Orders Placed This Encounter  Procedures  . Ambulatory referral to Orthopedic Surgery    The patient was counselled, risk factors were discussed, anticipatory guidance given.   Return for He will keep his chronic office visits.  Please see AVS  handed out to patient at the end of our visit for further patient instructions/ counseling done pertaining to today's office visit.  Note:  This document was prepared using Dragon voice recognition software and may include unintentional dictation errors.  I have reviewed the above documentation for accuracy and completeness, and I agree with the above.    This document serves as a record of services personally performed by Mellody Dance, MD. It was created on her behalf by Georga Bora, a trained medical scribe. The creation of this record is based on the scribe's personal observations and the provider's statements to them.   I have reviewed the above medical documentation for accuracy and completeness and I concur.  Mellody Dance 04/16/18 4:15 PM     -------------------------------------------------------------------------------------------------------------------------------------------------------------------------  Subjective: Russell Robles is a 57 y.o. male who sustained a left shoulder injury yesterday.   -Mechanism of injury: Pt slipped and fell off his porch yesterday morning, landing on his left shoulder- intense pain ever since and hurts to move -Pt complains of pain with lifting his arm above a certain height and with certain twisting movments -Symptoms have been constant since that time.  -Pt has been taking tylenol which has alleviated some of the pain -Pt is struggling to put on his shirts and use his left arm at all due to discomfort   Patient Care Team    Relationship Specialty Notifications Start End  Mellody Dance, DO PCP - General Family Medicine  03/01/17      Past Medical History:  Diagnosis  Date  . Cough    PER PT NON-PRODUCTIVE , NO FEVER OR CONGESTION  . Hernia, inguinal, right   . Hyperlipidemia   . Hypertension   . Type 2 diabetes mellitus ([place])    last  HbA1c 7.2 on 05-09-2017 in epic    Past Surgical History:  Procedure Laterality Date    . INGUINAL HERNIA REPAIR Left 12-16-2010  dr Donne Hazel  [facility]  . INGUINAL HERNIA REPAIR Right 06/01/2017   Procedure: OPEN RIGHT INGUINAL HERNIA REPAIR WITH MESH;  Surgeon: Kinsinger, Arta Bruce, MD;  Location: [facility];  Service: General;  Laterality: Right;  GENERAL AND TAP BLOCK  . INSERTION OF MESH Right 06/01/2017   Procedure: INSERTION OF MESH;  Surgeon: Kinsinger, Arta Bruce, MD;  Location: [place];  Service: General;  Laterality: Right;  GENERAL AND TAP BLOCK    Social History   Substance and Sexual Activity  Drug Use No  ,  Social History   Substance and Sexual Activity  Alcohol Use No  . Frequency: Never  ,  Social History   Tobacco Use  Smoking Status Never Smoker  Smokeless Tobacco Never Used  ,  Social History   Substance and Sexual Activity  Sexual Activity Yes  . Partners: Female  . Birth control/protection: None    Patient's Medications  New Prescriptions   No medications on file  Previous Medications   LINAGLIPTIN-METFORMIN HCL (JENTADUETO) 2.10-998 MG TABS    Take 1 tablet by mouth 2 (two) times daily.   LIRAGLUTIDE (VICTOZA) 18 MG/3ML SOPN    Inject 0.2 mLs (1.2 mg total) into the skin daily. INJECT 0.6 MG SUBCUTANEOUSLY ONCE DAILY FOR 1 WEEK, THEN 1.2 MG ONCE DAILY.   LOSARTAN-HYDROCHLOROTHIAZIDE (HYZAAR) 100-12.5 MG TABLET    Take 1 tablet by mouth daily.   METOPROLOL SUCCINATE (TOPROL-XL) 50 MG 24 HR TABLET    Take 1 tablet (50 mg total) by mouth every morning.   SIMVASTATIN (ZOCOR) 80 MG TABLET    Take 1 tablet (80 mg total) by mouth at bedtime.  Modified Medications   No medications on file  Discontinued Medications   No medications on file    Patient has no known allergies.  Scheduled Meds: Continuous Infusions: PRN Meds:.  Review of Systems: General:   Denies fever, chills, unexplained weight loss.  Optho/Auditory:   Denies visual changes, blurred vision/LOV Respiratory:   Denies SOB, DOE more than  baseline levels.   Cardiovascular:   Denies chest pain, palpitations, new onset peripheral edema  Gastrointestinal:   Denies nausea, vomiting, diarrhea.  Genitourinary: Denies dysuria, freq/ urgency, flank pain or discharge from genitals.  Endocrine:     Denies hot or cold intolerance, polyuria, polydipsia. Musculoskeletal:   See above in HPI  Skin:  Denies rash, suspicious lesions Neurological:     Denies dizziness, unexplained weakness, numbness  Psychiatric/Behavioral:   Denies mood changes, suicidal or homicidal ideations, hallucinations   Objective:   Blood pressure 124/75, pulse 67, weight 222 lb (100.7 kg), SpO2 97 %. Body mass index is 33.75 kg/m. General: Well Developed, well nourished, and in no acute distress.  Neuro: Alert and oriented x3, extra-ocular muscles intact, sensation grossly intact.  HEENT: Normocephalic, atraumatic, pupils equal round reactive to light, neck supple Skin: no gross suspicious lesions or rashes  Cardiac: Regular rate and rhythm, no murmurs rubs or gallops.  Respiratory: Essentially clear to auscultation bilaterally. Not using accessory muscles, speaking in full sentences.  Abdominal: Soft, not grossly distended Musculoskeletal:  Ambulates w/o diff, FROM * 4 ext.  Vasc: less 2 sec cap RF, warm and pink  Psych: No HI/SI, judgement and insight good, Euthymic mood. Full Affect. Left shoulder: -On inspection left shoulder is swollen in anterior joint and warmer to touch than his right Inspection reveals no abnormalities, atrophy or asymmetry. + tenderness over AC joint, No supraspinatus tendon insertion point or bicipital groove. ROM is blunted in all planes due to pain.  Patient is unable for me to assist with passive range of motion.  Hawkin's and neer's tests positive,   negative empty can  Rotator cuff muscle strength - unable to test due to pt pain Normal scapular function observed. No apprehension sign

## 2018-04-17 DIAGNOSIS — S46019A Strain of muscle(s) and tendon(s) of the rotator cuff of unspecified shoulder, initial encounter: Secondary | ICD-10-CM | POA: Insufficient documentation

## 2018-05-01 ENCOUNTER — Other Ambulatory Visit (INDEPENDENT_AMBULATORY_CARE_PROVIDER_SITE_OTHER): Payer: 59

## 2018-05-01 DIAGNOSIS — Z1211 Encounter for screening for malignant neoplasm of colon: Secondary | ICD-10-CM

## 2018-05-01 LAB — IFOBT (OCCULT BLOOD)
IFOBT: NEGATIVE
IFOBT: NEGATIVE
IFOBT: NEGATIVE

## 2018-06-06 ENCOUNTER — Other Ambulatory Visit: Payer: Self-pay | Admitting: Family Medicine

## 2018-06-06 DIAGNOSIS — E119 Type 2 diabetes mellitus without complications: Secondary | ICD-10-CM

## 2018-06-08 DIAGNOSIS — M5412 Radiculopathy, cervical region: Secondary | ICD-10-CM | POA: Diagnosis not present

## 2018-06-11 ENCOUNTER — Telehealth: Payer: Self-pay

## 2018-06-11 ENCOUNTER — Telehealth: Payer: Self-pay | Admitting: Family Medicine

## 2018-06-11 NOTE — Telephone Encounter (Signed)
Jentadueto required a PA for insurance to cover and the PA was denied - patient will have to pay out of pocket for medication.  Please review and advise if this medication can be changed. MPulliam, CMA/RT(R)

## 2018-06-11 NOTE — Telephone Encounter (Signed)
Have pt call his insurance or go online to see what meds are preferred/ would be covered as I do not know what his insurance prefers.

## 2018-06-11 NOTE — Telephone Encounter (Signed)
Message sent to Dr. Raliegh Scarlet waiting on her to review and advise. MPulliam, CMA/RT(R)

## 2018-06-11 NOTE — Telephone Encounter (Signed)
Patient states his Ins Co cld him to says they will not cover : Linagliptin-metFORMIN HCl (JENTADUETO) 2.10-998 MG TABS [[id number]]   Order Details  Dose: 1 tablet Route: Oral Frequency: 2 times daily  Indications of Use: Type 2 Diabetes Mellitus  Dispense Quantity: 180 tablet Refills: 3 Fills remaining: --        Sig: Take 1 tablet by mouth 2 (two) times daily.     ---Insurance Co is advising provider/ PCP to switch to a different Rx / brand.  --Forwarding message to medical assistant to review with provider for switch-- if questions pls call patient or Ins Co.  --also pharmacy says they sent notice to provider office.  --glh

## 2018-06-11 NOTE — Telephone Encounter (Signed)
Called and notified patient. MPulliam, CMA/RT(R)

## 2018-06-19 ENCOUNTER — Ambulatory Visit: Payer: 59 | Admitting: Family Medicine

## 2018-06-19 ENCOUNTER — Other Ambulatory Visit: Payer: Self-pay | Admitting: Adult Health

## 2018-06-19 ENCOUNTER — Telehealth: Payer: Self-pay | Admitting: Family Medicine

## 2018-06-19 DIAGNOSIS — E118 Type 2 diabetes mellitus with unspecified complications: Secondary | ICD-10-CM

## 2018-06-19 MED ORDER — SAXAGLIPTIN-METFORMIN ER 2.5-1000 MG PO TB24: 1.0000 | ORAL_TABLET | Freq: Every day | ORAL | 1 refills | Status: DC

## 2018-06-19 NOTE — Telephone Encounter (Signed)
Pt called states his Ins Co states they will approve use of this medication:   KOMBIGLYZE XR R   Generic Name and Formulations:  Saxagliptin, metformin HCl (ext-rel); 5mg /500mg , 5mg /1000mg , 2.5mg /1000mg ; tabs.  Company:  [employer]   ---Advised patient that (Dr. Jenetta Downer & her medical assistant) are out of office til Monday, January 6 ,but I would forward his request / message to Mina Marble  NP-C for review  & poss approval, since he states took his last diabetes Rx for Jentadueto today 06/19/18 and is completely out.  --Also suggested he ask pharmacy for just enough of his current meds just Dr. Jenetta Downer returns to office on Monday.  Pt uses :  Preferred Pharmacies      [place], [place] - [place] [phone] (Phone) [phone] (Fax)   --glh

## 2018-06-19 NOTE — Telephone Encounter (Signed)
Good Evening Tonya, Can you please call Mr. Murrillo and share- I sent Saxagliptin/Metformin 2.5/1000mg  QD  Thanks! Valetta Fuller

## 2018-06-19 NOTE — Telephone Encounter (Signed)
Please advise.  T. Nelson, CMA 

## 2018-06-26 ENCOUNTER — Ambulatory Visit: Payer: BLUE CROSS/BLUE SHIELD | Admitting: Family Medicine

## 2018-06-26 ENCOUNTER — Encounter: Payer: Self-pay | Admitting: Family Medicine

## 2018-06-26 VITALS — BP 132/88 | HR 64 | Temp 98.3°F | Ht 68.0 in | Wt 230.6 lb

## 2018-06-26 DIAGNOSIS — E1159 Type 2 diabetes mellitus with other circulatory complications: Secondary | ICD-10-CM | POA: Diagnosis not present

## 2018-06-26 DIAGNOSIS — E118 Type 2 diabetes mellitus with unspecified complications: Secondary | ICD-10-CM

## 2018-06-26 DIAGNOSIS — E669 Obesity, unspecified: Secondary | ICD-10-CM

## 2018-06-26 DIAGNOSIS — Z23 Encounter for immunization: Secondary | ICD-10-CM

## 2018-06-26 DIAGNOSIS — I10 Essential (primary) hypertension: Secondary | ICD-10-CM

## 2018-06-26 DIAGNOSIS — E785 Hyperlipidemia, unspecified: Secondary | ICD-10-CM

## 2018-06-26 DIAGNOSIS — E119 Type 2 diabetes mellitus without complications: Secondary | ICD-10-CM

## 2018-06-26 DIAGNOSIS — E1169 Type 2 diabetes mellitus with other specified complication: Secondary | ICD-10-CM

## 2018-06-26 DIAGNOSIS — I152 Hypertension secondary to endocrine disorders: Secondary | ICD-10-CM

## 2018-06-26 LAB — POCT UA - MICROALBUMIN
Albumin/Creatinine Ratio, Urine, POC: <30
Creatinine, POC: 20 mg/dL
Microalbumin Ur, POC: 30 mg/L

## 2018-06-26 LAB — POCT GLYCOSYLATED HEMOGLOBIN (HGB A1C): Hemoglobin A1C: 6.9 % — AB (ref 4.0–5.6)

## 2018-06-26 NOTE — Progress Notes (Signed)
Impression and Recommendations:    1. Diabetes mellitus type 2 with complications ([place])   2. Hypertension associated with diabetes ([place])   3. Hyperlipidemia associated with type 2 diabetes mellitus ([name])   4. Obesity, Class I, BMI 30-34.9   5. Type 2 diabetes mellitus without retinopathy ([place])   6. Flu vaccine need     1. Diabetes Mellitus - 6.9 today, elevated from 6.5 last check. - Stable at this time, but A1c has risen since last check.  - Saxagliptin-Metformin is his new diabetes medicine that he just started 4 days ago due to insurance changes.  - Continue treatment plan as prescribed.  See med list below. - Patient tolerating meds well without complication.  Denies S-E.  - Patient plans to begin elliptical every day. - Plans to eat less pasta.  - Counseled patient on pathophysiology of disease and discussed various treatment options, which often includes dietary and lifestyle modifications as first line.  Importance of low carb/ketogenic diet discussed with patient in addition to regular exercise.   - Check FBS and 2 hours after the biggest meal of your day.  Keep log and bring in next OV for my review.   Also, if you ever feel poorly, please check your blood pressure and blood sugar, as one or the other could be the cause of your symptoms.  - Being a diabetic, you need yearly eye and foot exams. Make appt.for diabetic eye exam.  2. Hypertension - BP elevated on intake today. - Continue treatment plan as prescribed.  See med list below. - Patient tolerating meds well without complication.  Denies S-E.  - Reviewed goal as 135/85 or less. - Patient knows to call in to clinic if blood pressure runs high.  - Lifestyle changes such as dash diet and engaging in a regular exercise program discussed with patient.  Educational handouts provided  - Continued ambulatory BP monitoring encouraged. Keep log and bring in next OV.  3 Hyperlipidemia - Stable last check.  Managed  on statins. - Continue treatment plan as prescribed.  See med list below. - Patient tolerating meds well without complication.  Denies S-E - Will continue to monitor.  4. BMI Counseling - BMI of 35.06 Explained to patient what BMI refers to, and what it means medically.    Told patient to think about it as a "medical risk stratification measurement" and how increasing BMI is associated with increasing risk/ or worsening state of various diseases such as hypertension, hyperlipidemia, diabetes, premature OA, depression etc.  American Heart Association guidelines for healthy diet, basically Mediterranean diet, and exercise guidelines of 30 minutes 5 days per week or more discussed in detail.  Health counseling performed.  All questions answered.  5. Lifestyle & Preventative Health Maintenance - Advised patient to continue working toward exercising to improve overall mental, physical, and emotional health.    - Reviewed the "spokes of the wheel" of mood and health management.  Stressed the importance of ongoing prudent habits, including regular exercise, appropriate sleep hygiene, healthful dietary habits, and prayer/meditation to relax.  - Encouraged patient to engage in daily physical activity, especially a formal exercise routine.  Recommended that the patient eventually strive for at least 150 minutes of moderate cardiovascular activity per week according to guidelines established by the [facility].   - Healthy dietary habits encouraged, including low-carb, and high amounts of lean protein in diet.   - Patient should also consume adequate amounts of water.   Education and  routine counseling performed. Handouts provided.  Orders Placed This Encounter  Procedures  . Flu Vaccine QUAD 6+ mos PF IM (Fluarix Quad PF)  . POCT glycosylated hemoglobin (Hb A1C)  . POCT UA - Microalbumin    There are no discontinued medications.    No orders of the defined types were placed in this  encounter.   The patient was counseled, risk factors were discussed, anticipatory guidance given.  Gross side effects, risk and benefits, and alternatives of medications discussed with patient.  Patient is aware that all medications have potential side effects and we are unable to predict every side effect or drug-drug interaction that may occur.  Expresses verbal understanding and consents to current therapy plan and treatment regimen.   Return for 3-4 mo DM, BP, CHol etc.   Please see AVS handed out to patient at the end of our visit for further patient instructions/ counseling done pertaining to today's office visit.    Note:  This document was prepared using Dragon voice recognition software and may include unintentional dictation errors.  This document serves as a record of services personally performed by Mellody Dance, DO. It was created on her behalf by [name], a trained medical scribe. The creation of this record is based on the scribe's personal observations and the provider's statements to them.   I have reviewed the above medical documentation for accuracy and completeness and I concur.  Mellody Dance, DO 06/27/2018 9:21 PM        Subjective:    Chief Complaint  Patient presents with  . Follow-up    Winston Misner is a 58 y.o. male who presents to [name] at [facility] today for Diabetes Management.    Shoulder Concerns Patient followed up with someone in [place] to treat his shoulder.  Was told he has arthritis and a pinched nerve.  Was prescribed steroids.  He's been off of the steroids for about two weeks.  These steroids did spike his sugars.  States "my whole arm feels like it's just dead, and the only feeling I have is in my fingers and it's tingling."  They want to do an MRI of his neck next, due to the pinched nerve.  Returns for follow up on the 23rd.  Denies loss of strength or function.  "I can still do everything  I need to do."  DM HPI: -  He has not particularly been working on diet and exercise for diabetes, over the holidays.  However, he has a goal of getting to 200 lbs.  Says "I feel that if I go under, I will look sick."  He has been eating less pasta than he used to.  Eating more fruits and vegetables.  States that he "went crazy" over the holidays.  Getting back on his regular regiment this week of hitting the elliptical and eating better.  Plans to use the elliptical every day after he gets out of bed.  Pt is currently maintained on the following medications for diabetes:   see med list today Medication compliance - following treatment plan as recommended.  Home glucose readings range: since he's been on the prednisone, his fasting sugars started out 180, 190, and the past week, it's been in the 200's.   Denies polyuria/polydipsia. Denies hypo/ hyperglycemia symptoms - He denies new onset of: chest pain, exercise intolerance, shortness of breath, dizziness, visual changes, headache, lower extremity swelling or claudication.   Last diabetic eye exam was No results found  for: HMDIABEYEEXA  Foot exam- UTD  Last A1C in the office was:  Lab Results  Component Value Date   HGBA1C 6.9 (A) 06/26/2018   HGBA1C 6.5 (H) 03/19/2018   HGBA1C 6.3 (A) 12/19/2017    Lab Results  Component Value Date   MICROALBUR 30 06/26/2018   LDLCALC 72 03/19/2018   CREATININE 0.90 03/19/2018     1. HTN HPI:  -  His blood pressure has largely been controlled at home.  Pt is checking it at home.  Usually running 124-125 over 87 at home.  - Patient reports good compliance with blood pressure medications  - Denies medication S-E   - Smoking Status noted   - He denies new onset of: chest pain, exercise intolerance, shortness of breath, dizziness, visual changes, headache, lower extremity swelling or claudication.   Last 3 blood pressure readings in our office are as follows: BP Readings from Last 3  Encounters:  06/26/18 132/88  04/16/18 124/75  03/19/18 132/83    Filed Weights   06/26/18 1633  Weight: 230 lb 9.6 oz (104.6 kg)    Last 3 blood pressure readings in our office are as follows: BP Readings from Last 3 Encounters:  06/26/18 132/88  04/16/18 124/75  03/19/18 132/83    BMI Readings from Last 3 Encounters:  06/26/18 35.06 kg/m  04/16/18 33.75 kg/m  03/19/18 34.42 kg/m     No problems updated.    Patient Care Team    Relationship Specialty Notifications Start End  Mellody Dance, DO PCP - General Family Medicine  03/01/17      Patient Active Problem List   Diagnosis Date Noted  . Diabetes mellitus type 2 with complications ([place]) 46/56/8127    Priority: High  . Hypertension associated with diabetes ([place]) 06/18/2015    Priority: High  . Hyperlipidemia associated with type 2 diabetes mellitus ([place]) 06/18/2015    Priority: High  . Obesity, Class I, BMI 30-34.9 04/12/2017    Priority: Medium  . Inactivity 04/12/2017    Priority: Low  . Age-related nuclear cataract of both eyes 06/18/2015    Priority: Low  . Strain of rotator cuff capsule 04/17/2018  . Impingement syndrome of shoulder region, left 04/16/2018  . Bursitis of shoulder, left 04/16/2018  . Cortical age-related cataract of both eyes 02/01/2018  . Dermatochalasis of both upper eyelids 02/01/2018  . Nuclear sclerotic cataract of both eyes 02/01/2018  . Type 2 diabetes mellitus without retinopathy ([place]) 02/01/2018  . new onset Murmur, heart 12/19/2017  . Acute pain of left shoulder 10/17/2017  . Presbyopia 06/18/2015  . Inguinal hernia unilateral, non-recurrent 12/13/2010     Past Medical History:  Diagnosis Date  . Cough    PER PT NON-PRODUCTIVE , NO FEVER OR CONGESTION  . Hernia, inguinal, right   . Hyperlipidemia   . Hypertension   . Type 2 diabetes mellitus ([place])    last  HbA1c 7.2 on 05-09-2017 in epic     Past Surgical History:  Procedure Laterality Date  . INGUINAL  HERNIA REPAIR Left 12-16-2010  dr Donne Hazel  [facility]  . INGUINAL HERNIA REPAIR Right 06/01/2017   Procedure: OPEN RIGHT INGUINAL HERNIA REPAIR WITH MESH;  Surgeon: Kinsinger, Arta Bruce, MD;  Location: [place];  Service: General;  Laterality: Right;  GENERAL AND TAP BLOCK  . INSERTION OF MESH Right 06/01/2017   Procedure: INSERTION OF MESH;  Surgeon: Kinsinger, Arta Bruce, MD;  Location: [facility];  Service: General;  Laterality: Right;  GENERAL AND TAP BLOCK     Family History  Problem Relation Age of Onset  . COPD Mother   . Aortic aneurysm Father   . Lung cancer Sister      Social History   Substance and Sexual Activity  Drug Use No  ,  Social History   Substance and Sexual Activity  Alcohol Use No  . Frequency: Never  ,  Social History   Tobacco Use  Smoking Status Never Smoker  Smokeless Tobacco Never Used  ,    Current Outpatient Medications on File Prior to Visit  Medication Sig Dispense Refill  . losartan-hydrochlorothiazide (HYZAAR) 100-12.5 MG tablet Take 1 tablet by mouth daily. (Patient taking differently: Take 1 tablet by mouth every morning. ) 90 tablet 3  . metoprolol succinate (TOPROL-XL) 50 MG 24 hr tablet Take 1 tablet (50 mg total) by mouth every morning. 90 tablet 3  . Saxagliptin-Metformin 2.10-998 MG TB24 Take 1 tablet by mouth daily. 30 tablet 1  . simvastatin (ZOCOR) 80 MG tablet Take 1 tablet (80 mg total) by mouth at bedtime. 90 tablet 3  . VICTOZA 18 MG/3ML SOPN INJECT 0.6 MG SUBCUTANEOUSLY ONCE DAILY FOR 1 WEEK, THEN 1.2 MG ONCE DAILY. 6 mL 1   No current facility-administered medications on file prior to visit.      No Known Allergies   Review of Systems:   General:  Denies fever, chills Optho/Auditory:   Denies visual changes, blurred vision Respiratory:   Denies SOB, cough, wheeze, DIB  Cardiovascular:   Denies chest pain, palpitations, painful respirations Gastrointestinal:   Denies nausea,  vomiting, diarrhea.  Endocrine:     Denies new hot or cold intolerance Musculoskeletal:  Denies joint swelling, gait issues, or new unexplained myalgias/ arthralgias Skin:  Denies rash, suspicious lesions  Neurological:    Denies dizziness, unexplained weakness, numbness  Psychiatric/Behavioral:   Denies mood changes    Objective:     Blood pressure 132/88, pulse 64, temperature 98.3 F (36.8 C), height 5\' 8"  (1.727 m), weight 230 lb 9.6 oz (104.6 kg), SpO2 98 %.  Body mass index is 35.06 kg/m.  General: Well Developed, well nourished, and in no acute distress.  HEENT: Normocephalic, atraumatic, pupils equal round reactive to light, neck supple, No carotid bruits, no JVD Skin: Warm and dry, cap RF less 2 sec Cardiac: Regular rate and rhythm, S1, S2 WNL's, no murmurs rubs or gallops Respiratory: ECTA B/L, Not using accessory muscles, speaking in full sentences. NeuroM-Sk: Ambulates w/o assistance, moves ext * 4 w/o difficulty, sensation grossly intact.  Ext: scant edema b/l lower ext Psych: No HI/SI, judgement and insight good, Euthymic mood. Full Affect.

## 2018-06-26 NOTE — Patient Instructions (Addendum)
Russell Robles, please find out who he goes to for eye exam and please obtain this for our records.     Diabetes Mellitus and Standards of Medical Care  Managing diabetes (diabetes mellitus) can be complicated. Your diabetes treatment may be managed by a team of health care providers, including:  A diet and nutrition specialist (registered dietitian).  A nurse.  A certified diabetes educator (CDE).  A diabetes specialist (endocrinologist).  An eye doctor.  A primary care provider.  A dentist.  Your health care providers follow a schedule in order to help you get the best quality of care. The following schedule is a general guideline for your diabetes management plan. Your health care providers may also give you more specific instructions.  HbA1c (hemoglobin A1c) test This test provides information about blood sugar (glucose) control over the previous 2-3 months. It is used to check whether your diabetes management plan needs to be adjusted.  If you are meeting your treatment goals, this test is done at least 2 times a year.  If you are not meeting treatment goals or if your treatment goals have changed, this test is done 4 times a year.  Blood pressure test  This test is done at every routine medical visit. For most people, the goal is less than 130/80. Ask your health care provider what your goal blood pressure should be.  Dental and eye exams  Visit your dentist two times a year.  If you have type 1 diabetes, get an eye exam 3-5 years after you are diagnosed, and then once a year after your first exam. ? If you were diagnosed with type 1 diabetes as a child, get an eye exam when you are age 85 or older and have had diabetes for 3-5 years. After the first exam, you should get an eye exam once a year.  If you have type 2 diabetes, have an eye exam as soon as you are diagnosed, and then once a year after your first exam.  Foot care exam  Visual foot exams are done at every  routine medical visit. The exams check for cuts, bruises, redness, blisters, sores, or other problems with the feet.  A complete foot exam is done by your health care provider once a year. This exam includes an inspection of the structure and skin of your feet, and a check of the pulses and sensation in your feet. ? Type 1 diabetes: Get your first exam 3-5 years after diagnosis. ? Type 2 diabetes: Get your first exam as soon as you are diagnosed.  Check your feet every day for cuts, bruises, redness, blisters, or sores. If you have any of these or other problems that are not healing, contact your health care provider.  Kidney function test (urine microalbumin)  This test is done once a year. ? Type 1 diabetes: Get your first test 5 years after diagnosis. ? Type 2 diabetes: Get your first test as soon as you are diagnosed._  If you have chronic kidney disease (CKD), get a serum creatinine and estimated glomerular filtration rate (eGFR) test once a year.  Lipid profile (cholesterol, HDL, LDL, triglycerides)  This test should be done when you are diagnosed with diabetes, and every 5 years after the first test. If you are on medicines to lower your cholesterol, you may need to get this test done every year. ? The goal for LDL is less than 100 mg/dL (5.5 mmol/L). If you are at high risk, the goal is  less than 70 mg/dL (3.9 mmol/L). ? The goal for HDL is 40 mg/dL (2.2 mmol/L) for men and 50 mg/dL(2.8 mmol/L) for women. An HDL cholesterol of 60 mg/dL (3.3 mmol/L) or higher gives some protection against heart disease. ? The goal for triglycerides is less than 150 mg/dL (8.3 mmol/L).  Immunizations  The yearly flu (influenza) vaccine is recommended for everyone 6 months or older who has diabetes.  The pneumonia (pneumococcal) vaccine is recommended for everyone 2 years or older who has diabetes. If you are 75 or older, you may get the pneumonia vaccine as a series of two separate shots.  The  hepatitis B vaccine is recommended for adults shortly after they have been diagnosed with diabetes.  The Tdap (tetanus, diphtheria, and pertussis) vaccine should be given: ? According to normal childhood vaccination schedules, for children. ? Every 10 years, for adults who have diabetes.  The shingles vaccine is recommended for people who have had chicken pox and are 50 years or older.  Mental and emotional health  Screening for symptoms of eating disorders, anxiety, and depression is recommended at the time of diagnosis and afterward as needed. If your screening shows that you have symptoms (you have a positive screening result), you may need further evaluation and be referred to a mental health care provider.  Diabetes self-management education  Education about how to manage your diabetes is recommended at diagnosis and ongoing as needed.  Treatment plan  Your treatment plan will be reviewed at every medical visit.  Summary  Managing diabetes (diabetes mellitus) can be complicated. Your diabetes treatment may be managed by a team of health care providers.  Your health care providers follow a schedule in order to help you get the best quality of care.  Standards of care including having regular physical exams, blood tests, blood pressure monitoring, immunizations, screening tests, and education about how to manage your diabetes.  Your health care providers may also give you more specific instructions based on your individual health.      Type 2 Diabetes Mellitus, Self Care, Adult Caring for yourself after you have been diagnosed with type 2 diabetes (type 2 diabetes mellitus) means keeping your blood sugar (glucose) under control with a balance of:  Nutrition.  Exercise.  Lifestyle changes.  Medicines or insulin, if necessary.  Support from your team of health care providers and others.  The following information explains what you need to know to manage your diabetes  at home. What do I need to do to manage my blood glucose?  Check your blood glucose every day, as often as told by your health care provider.  Contact your health care provider if your blood glucose is above your target for 2 tests in a row.  Have your A1c (hemoglobin A1c) level checked at least two times a year, or as often as told by your health care provider. Your health care provider will set individualized treatment goals for you. Generally, the goal of treatment is to maintain the following blood glucose levels:  Before meals (preprandial): 80-130 mg/dL (4.4-7.2 mmol/L).  After meals (postprandial): below 180 mg/dL (10 mmol/L).  A1c level: less than 7%.  What do I need to know about hyperglycemia and hypoglycemia? What is hyperglycemia? Hyperglycemia, also called high blood glucose, occurs when blood glucose is too high.Make sure you know the early signs of hyperglycemia, such as:  Increased thirst.  Hunger.  Feeling very tired.  Needing to urinate more often than usual.  Blurry vision.  What is hypoglycemia? Hypoglycemia, also called low blood glucose, occurswith a blood glucose level at or below 70 mg/dL (3.9 mmol/L). The risk for hypoglycemia increases during or after exercise, during sleep, during illness, and when skipping meals or not eating for a long time (fasting). It is important to know the symptoms of hypoglycemia and treat it right away. Always have a 15-gram rapid-acting carbohydrate snack with you to treat low blood glucose. Family members and close friends should also know the symptoms and should understand how to treat hypoglycemia, in case you are not able to treat yourself. What are the symptoms of hypoglycemia? Hypoglycemia symptoms can include:  Hunger.  Anxiety.  Sweating and feeling clammy.  Confusion.  Dizziness or feeling light-headed.  Sleepiness.  Nausea.  Increased heart rate.  Headache.  Blurry  vision.  Seizure.  Nightmares.  Tingling or numbness around the mouth, lips, or tongue.  A change in speech.  Decreased ability to concentrate.  A change in coordination.  Restless sleep.  Tremors or shakes.  Fainting.  Irritability.  How do I treat hypoglycemia?  If you are alert and able to swallow safely, follow the 15:15 rule:  Take 15 grams of a rapid-acting carbohydrate. Rapid-acting options include: ? 1 tube of glucose gel. ? 3 glucose pills. ? 6-8 pieces of hard candy. ? 4 oz (120 mL) of fruit juice. ? 4 oz (120 mL) of regular (not diet) soda.  Check your blood glucose 15 minutes after you take the carbohydrate.  If the repeat blood glucose level is still at or below 70 mg/dL (3.9 mmol/L), take 15 grams of a carbohydrate again.  If your blood glucose level does not increase above 70 mg/dL (3.9 mmol/L) after 3 tries, seek emergency medical care.  After your blood glucose level returns to normal, eat a meal or a snack within 1 hour.  How do I treat severe hypoglycemia? Severe hypoglycemia is when your blood glucose level is at or below 54 mg/dL (3 mmol/L). Severe hypoglycemia is an emergency. Do not wait to see if the symptoms will go away. Get medical help right away. Call your local emergency services (911 in the [place] Do not drive yourself to the hospital. If you have severe hypoglycemia and you cannot eat or drink, you may need an injection of glucagon. A family member or close friend should learn how to check your blood glucose and how to give you a glucagon injection. Ask your health care provider if you need to have an emergency glucagon injection kit available. Severe hypoglycemia may need to be treated in a hospital. The treatment may include getting glucose through an IV tube. You may also need treatment for the cause of your hypoglycemia. Can having diabetes put me at risk for other conditions? Having diabetes can put you at risk for other long-term  (chronic) conditions, such as heart disease and kidney disease. Your health care provider may prescribe medicines to help prevent complications from diabetes. These medicines may include:  Aspirin.  Medicine to lower cholesterol.  Medicine to control blood pressure.  What else can I do to manage my diabetes? Take your diabetes medicines as told  If your health care provider prescribed insulin or diabetes medicines, take them every day.  Do not run out of insulin or other diabetes medicines that you take. Plan ahead so you always have these available.  If you use insulin, adjust your dosage based on how physically active you are and what foods you eat. Your  health care provider will tell you how to adjust your dosage. Make healthy food choices  The things that you eat and drink affect your blood glucose and your insulin dosage. Making good choices helps to control your diabetes and prevent other health problems. A healthy meal plan includes eating lean proteins, complex carbohydrates, fresh fruits and vegetables, low-fat dairy products, and healthy fats. Make an appointment to see a diet and nutrition specialist (registered dietitian) to help you create an eating plan that is right for you. Make sure that you:  Follow instructions from your health care provider about eating or drinking restrictions.  Drink enough fluid to keep your urine clear or pale yellow.  Eat healthy snacks between nutritious meals.  Track the carbohydrates that you eat. Do this by reading food labels and learning the standard serving sizes of foods.  Follow your sick day plan whenever you cannot eat or drink as usual. Make this plan in advance with your health care provider.  Stay active  Exercise regularly, as told by your health care provider. This may include:  Stretching and doing strength exercises, such as yoga or weightlifting, at least 2 times a week.  Doing at least 150 minutes of moderate-intensity  or vigorous-intensity exercise each week. This could be brisk walking, biking, or water aerobics. ? Spread out your activity over at least 3 days of the week. ? Do not go more than 2 days in a row without doing some kind of physical activity.  When you start a new exercise or activity, work with your health care provider to adjust your insulin, medicines, or food intake as needed. Make healthy lifestyle choices  Do not use any tobacco products, such as cigarettes, chewing tobacco, and e-cigarettes. If you need help quitting, ask your health care provider.  If your health care provider says that alcohol is safe for you, limit alcohol intake to no more than 1 drink per day for nonpregnant women and 2 drinks per day for men. One drink equals 12 oz of beer, 5 oz of wine, or 1 oz of hard liquor.  Learn to manage stress. If you need help with this, ask your health care provider. Care for your body   Keep your immunizations up to date. In addition to getting vaccinations as told by your health care provider, it is recommended that you get vaccinated against the following illnesses: ? The flu (influenza). Get a flu shot every year. ? Pneumonia. ? Hepatitis B.  Schedule an eye exam soon after your diagnosis, and then one time every year after that.  Check your skin and feet every day for cuts, bruises, redness, blisters, or sores. Schedule a foot exam with your health care provider once every year.  Brush your teeth and gums two times a day, and floss at least one time a day. Visit your dentist at least once every 6 months.  Maintain a healthy weight. General instructions  Take over-the-counter and prescription medicines only as told by your health care provider.  Share your diabetes management plan with people in your workplace, school, and household.  Check your urine for ketones when you are ill and as told by your health care provider.  Ask your health care provider: ? Do I need to  meet with a diabetes educator? ? Where can I find a support group for people with diabetes?  Carry a medical alert card or wear medical alert jewelry.  Keep all follow-up visits as told by your health  care provider. This is important. Where to find more information: For more information about diabetes, visit:  American Diabetes Association (ADA): www.diabetes.org  American Association of Diabetes Educators (AADE): www.diabeteseducator.org/patient-resources  This information is not intended to replace advice given to you by your health care provider. Make sure you discuss any questions you have with your health care provider. Document Released: 09/28/2015 Document Revised: 11/12/2015 Document Reviewed: 07/10/2015 Elsevier Interactive Patient Education  2017 [employer].      Blood Glucose Monitoring, Adult Monitoring your blood sugar (glucose) helps you manage your diabetes. It also helps you and your health care provider determine how well your diabetes management plan is working. Blood glucose monitoring involves checking your blood glucose as often as directed, and keeping a record (log) of your results over time. Why should I monitor my blood glucose? Checking your blood glucose regularly can:  Help you understand how food, exercise, illnesses, and medicines affect your blood glucose.  Let you know what your blood glucose is at any time. You can quickly tell if you are having low blood glucose (hypoglycemia) or high blood glucose (hyperglycemia).  Help you and your health care provider adjust your medicines as needed.  When should I check my blood glucose? Follow instructions from your health care provider about how often to check your blood glucose.   This may depend on:  The type of diabetes you have.  How well-controlled your diabetes is.  Medicines you are taking.  If you have type 1 diabetes:  Check your blood glucose at least 2 times a day.  Also check your  blood glucose: ? Before every insulin injection. ? Before and after exercise. ? Between meals. ? 2 hours after a meal. ? Occasionally between 2:00 a.m. and 3:00 a.m., as directed. ? Before potentially dangerous tasks, like driving or using heavy machinery. ? At bedtime.  You may need to check your blood glucose more often, up to 6-10 times a day: ? If you use an insulin pump. ? If you need multiple daily injections (MDI). ? If your diabetes is not well-controlled. ? If you are ill. ? If you have a history of severe hypoglycemia. ? If you have a history of not knowing when your blood glucose is getting low (hypoglycemia unawareness).  If you have type 2 diabetes:  If you take insulin or other diabetes medicines, check your blood glucose at least 2 times a day.  If you are on intensive insulin therapy, check your blood glucose at least 4 times a day. Occasionally, you may also need to check between 2:00 a.m. and 3:00 a.m., as directed.  Also check your blood glucose: ? Before and after exercise. ? Before potentially dangerous tasks, like driving or using heavy machinery.  You may need to check your blood glucose more often if: ? Your medicine is being adjusted. ? Your diabetes is not well-controlled. ? You are ill.  What is a blood glucose log?  A blood glucose log is a record of your blood glucose readings. It helps you and your health care provider: ? Look for patterns in your blood glucose over time. ? Adjust your diabetes management plan as needed.  Every time you check your blood glucose, write down your result and notes about things that may be affecting your blood glucose, such as your diet and exercise for the day.  Most glucose meters store a record of glucose readings in the meter. Some meters allow you to download your records to  a computer. How do I check my blood glucose? Follow these steps to get accurate readings of your blood glucose: Supplies  needed   Blood glucose meter.  Test strips for your meter. Each meter has its own strips. You must use the strips that come with your meter.  A needle to prick your finger (lancet). Do not use lancets more than once.  A device that holds the lancet (lancing device).  A journal or log book to write down your results.  Procedure  Wash your hands with soap and water.  Prick the side of your finger (not the tip) with the lancet. Use a different finger each time.  Gently rub the finger until a small drop of blood appears.  Follow instructions that come with your meter for inserting the test strip, applying blood to the strip, and using your blood glucose meter.  Write down your result and any notes.  Alternative testing sites  Some meters allow you to use areas of your body other than your finger (alternative sites) to test your blood.  If you think you may have hypoglycemia, or if you have hypoglycemia unawareness, do not use alternative sites. Use your finger instead.  Alternative sites may not be as accurate as the fingers, because blood flow is slower in these areas. This means that the result you get may be delayed, and it may be different from the result that you would get from your finger.  The most common alternative sites are: ? Forearm. ? Thigh. ? Palm of the hand.  Additional tips  Always keep your supplies with you.  If you have questions or need help, all blood glucose meters have a 24-hour "hotline" number that you can call. You may also contact your health care provider.  After you use a few boxes of test strips, adjust (calibrate) your blood glucose meter by following instructions that came with your meter.    The American Diabetes Association suggests the following targets for most nonpregnant adults with diabetes.  More or less stringent glycemic goals may be appropriate for each individual.  A1C: Less than 7% A1C may also be reported as eAG: Less than  154 mg/dl Before a meal (preprandial plasma glucose): 80-130 mg/dl 1-2 hours after beginning of the meal (Postprandial plasma glucose)*: Less than 180 mg/dl  *Postprandial glucose may be targeted if A1C goals are not met despite reaching preprandial glucose goals.   GOALS in short:  The goals are for the Hgb A1C to be less than 7.0 & blood pressure to be less than 130/80.    It is recommended that all diabetics are educated on and follow a healthy diabetic diet, exercise for 30 minutes 3-4 times per week (walking, biking, swimming, or machine), monitor blood glucose readings and bring that record with you to be reviewed at your next office visit.     You should be checking fasting blood sugars- especially after you eat poorly or eat really healthy, and also check 2 hour postprandial blood sugars after largest meal of the day.    Write these down and bring in your log at each office visit.    You will need to be seen every 3 months by the provider managing your Diabetes unless told otherwise by that provider.   You will need yearly eye exams from an eye specialist and foot exams to check the nerves of your feet.  Also, your urine should be checked yearly as well to make sure excess protein  is not present.   If you are checking your blood pressure at home, please record it and bring it to your next office visit.    Follow the Dietary Approaches to Stop Hypertension (DASH) diet (3 servings of fruit and vegetables daily, whole grains, low sodium, low-fat proteins).  See below.    Lastly, when it comes to your cholesterol, the goal is to have the HDL (good cholesterol) >40, and the LDL (bad cholesterol) <100.   It is recommended that you follow a heart healthy, low saturated and trans-fat diet and exercise for 30 minutes at least 5 times a week.     (( Check out the DASH diet = 1.5 Gram Low Sodium Diet   A 1.5 gram sodium diet restricts the amount of sodium in the diet to no more than 1.5 g or  1500 mg daily.  The American Heart Association recommends Americans over the age of 56 to consume no more than 1500 mg of sodium each day to reduce the risk of developing high blood pressure.  Research also shows that limiting sodium may reduce heart attack and stroke risk.  Many foods contain sodium for flavor and sometimes as a preservative.  When the amount of sodium in a diet needs to be low, it is important to know what to look for when choosing foods and drinks.  The following includes some information and guidelines to help make it easier for you to adapt to a low sodium diet.    QUICK TIPS  Do not add salt to food.  Avoid convenience items and fast food.  Choose unsalted snack foods.  Buy lower sodium products, often labeled as "lower sodium" or "no salt added."  Check food labels to learn how much sodium is in 1 serving.  When eating at a restaurant, ask that your food be prepared with less salt or none, if possible.    READING FOOD LABELS FOR SODIUM INFORMATION  The nutrition facts label is a good place to find how much sodium is in foods. Look for products with no more than 400 mg of sodium per serving.  Remember that 1.5 g = 1500 mg.  The food label may also list foods as:  Sodium-free: Less than 5 mg in a serving.  Very low sodium: 35 mg or less in a serving.  Low-sodium: 140 mg or less in a serving.  Light in sodium: 50% less sodium in a serving. For example, if a food that usually has 300 mg of sodium is changed to become light in sodium, it will have 150 mg of sodium.  Reduced sodium: 25% less sodium in a serving. For example, if a food that usually has 400 mg of sodium is changed to reduced sodium, it will have 300 mg of sodium.    CHOOSING FOODS  Grains  Avoid: Salted crackers and snack items. Some cereals, including instant hot cereals. Bread stuffing and biscuit mixes. Seasoned rice or pasta mixes.  Choose: Unsalted snack items. Low-sodium cereals, oats, puffed wheat and  rice, shredded wheat. English muffins and bread. Pasta.  Meats  Avoid: Salted, canned, smoked, spiced, pickled meats, including fish and poultry. Bacon, ham, sausage, cold cuts, hot dogs, anchovies.  Choose: Low-sodium canned tuna and salmon. Fresh or frozen meat, poultry, and fish.  Dairy  Avoid: Processed cheese and spreads. Cottage cheese. Buttermilk and condensed milk. Regular cheese.  Choose: Milk. Low-sodium cottage cheese. Yogurt. Sour cream. Low-sodium cheese.  Fruits and Vegetables  Avoid: Regular canned  vegetables. Regular canned tomato sauce and paste. Frozen vegetables in sauces. Olives. Russell Robles. Relishes. Sauerkraut.  Choose: Low-sodium canned vegetables. Low-sodium tomato sauce and paste. Frozen or fresh vegetables. Fresh and frozen fruit.  Condiments  Avoid: Canned and packaged gravies. Worcestershire sauce. Tartar sauce. Barbecue sauce. Soy sauce. Steak sauce. Ketchup. Onion, garlic, and table salt. Meat flavorings and tenderizers.  Choose: Fresh and dried herbs and spices. Low-sodium varieties of mustard and ketchup. Lemon juice. Tabasco sauce. Horseradish.    SAMPLE 1.5 GRAM SODIUM MEAL PLAN:   Breakfast / Sodium (mg)  1 cup low-fat milk / 143 mg  1 whole-wheat English muffin / 240 mg  1 tbs heart-healthy margarine / 153 mg  1 hard-boiled egg / 139 mg  1 small orange / 0 mg  Lunch / Sodium (mg)  1 cup raw carrots / 76 mg  2 tbs no salt added peanut butter / 5 mg  2 slices whole-wheat bread / 270 mg  1 tbs jelly / 6 mg   cup red grapes / 2 mg  Dinner / Sodium (mg)  1 cup whole-wheat pasta / 2 mg  1 cup low-sodium tomato sauce / 73 mg  3 oz lean ground beef / 57 mg  1 small side salad (1 cup raw spinach leaves,  cup cucumber,  cup yellow bell pepper) with 1 tsp olive oil and 1 tsp red wine vinegar / 25 mg  Snack / Sodium (mg)  1 container low-fat vanilla yogurt / 107 mg  3 graham cracker squares / 127 mg  Nutrient Analysis  Calories: 1745  Protein: 75 g   Carbohydrate: 237 g  Fat: 57 g  Sodium: 1425 mg  Document Released: 06/06/2005 Document Revised: 02/16/2011 Document Reviewed: 09/07/2009  ExitCare Patient Information 2012 [place].))    This information is not intended to replace advice given to you by your health care provider. Make sure you discuss any questions you have with your health care provider. Document Released: 06/09/2003 Document Revised: 12/25/2015 Document Reviewed: 11/16/2015 Elsevier Interactive Patient Education  2017 [employer].

## 2018-07-11 DIAGNOSIS — M5412 Radiculopathy, cervical region: Secondary | ICD-10-CM | POA: Diagnosis not present

## 2018-07-16 DIAGNOSIS — M5412 Radiculopathy, cervical region: Secondary | ICD-10-CM | POA: Diagnosis not present

## 2018-07-27 ENCOUNTER — Other Ambulatory Visit: Payer: Self-pay | Admitting: Orthopedic Surgery

## 2018-07-31 ENCOUNTER — Encounter
Admission: RE | Admit: 2018-07-31 | Discharge: 2018-07-31 | Disposition: A | Discharge status: Home / Self Care | Payer: BLUE CROSS/BLUE SHIELD | Source: Ambulatory Visit | Attending: Orthopedic Surgery | Admitting: Orthopedic Surgery

## 2018-07-31 ENCOUNTER — Other Ambulatory Visit: Payer: Self-pay

## 2018-07-31 HISTORY — DX: Unspecified osteoarthritis, unspecified site: M19.90

## 2018-07-31 HISTORY — DX: Anxiety disorder, unspecified: F41.9

## 2018-07-31 NOTE — Pre-Procedure Instructions (Signed)
Russell Robles  ECHO COMPLETE WO IMAGING ENHANCING AGENT AND WITH 3D  Order# [id number]  Reading physician: Thayer Headings, MD Ordering physician: Mellody Dance, DO Study date: 01/01/18  ECHOCARDIOGRAM COMPLETE (Accession [id number]) (Order [id number])  Echocardiography  Date: 01/01/2018 Department: [place] Released By: Ardelle Anton Authorizing: Mellody Dance, DO  Linked Results   Procedure Abnormality Status  ECHOCARDIOGRAM COMPLETE    ECHOCARDIOGRAM COMPLETE  Order #: [id number] Accession #: [id number]  Patient Info   Patient name: Russell Robles  MRN: [id number]  Age: 58 y.o.  Sex: male  Vitals   BP Height Weight BSA (Calculated - sq m)  132/80 5\' 8"  (1.727 m) 103.2 kg 2.23 sq meters  Result Notes for ECHOCARDIOGRAM COMPLETE   Notes recorded by Fonnie Mu, CMA on 01/04/2018 at 5:21 PM EDT 01/04/18 Pt informed of results. Pt expressed understanding and is agreeable. Charyl Bigger, CMA  ------  Notes recorded by Mellody Dance, DO on 01/04/2018 at 5:18 PM EDT Please let patient know his echocardiogram was normal and there were no valvular abnormalities to explain why he might have a murmur. This is good news.      Study Result   Result status: Final result                           [facility]                        [address], [place] [address]                            [phone]  ------------------------------------------------------------------- Transthoracic Echocardiography  Patient:    Russell Robles MR #:       [id number] Study Date: 01/01/2018 Gender:     M Age:        58 Height:     172.7 cm Weight:     103.2 kg BSA:        2.26 m^2 Pt. Status: Room:   ATTENDING    Mertie Moores, M.D.  PERFORMING   [facility], Outpatient  SONOGRAPHER  [facility], RDCS  ORDERING     Opalski, Deborah  REFERRING    Opalski,  Deborah  cc:  ------------------------------------------------------------------- LV EF: 60% -   65%  ------------------------------------------------------------------- Indications:      Murmur (R01.1).  ------------------------------------------------------------------- History:   PMH:  Obesity.  Risk factors:  Hypertension. Diabetes mellitus. Dyslipidemia.  ------------------------------------------------------------------- Study Conclusions  - Left ventricle: The cavity size was normal. Wall thickness was   normal. Systolic function was normal. The estimated ejection   fraction was in the range of 60% to 65%. Wall motion was normal;   there were no regional wall motion abnormalities. Doppler   parameters are consistent with abnormal left ventricular   relaxation (grade 1 diastolic dysfunction). - Aortic valve: Moderately calcified annulus. Mildly thickened   leaflets.  ------------------------------------------------------------------- Study data:  No prior study was available for comparison.  Study status:  Routine.  Procedure:  Transthoracic echocardiography. Image quality was adequate.          Transthoracic echocardiography.  M-mode, complete 2D, 3D, spectral Doppler, and color Doppler.  Birthdate:  Patient birthdate: 1961-01-09.  Age: Patient is 58 yr  old.  Sex:  Gender: male.    BMI: 34.6 kg/m^2. Blood pressure:     132/80  Patient status:  Outpatient.  Study date:  Study date: 01/01/2018. Study time: 09:43 AM.  Location: [facility]  -------------------------------------------------------------------  ------------------------------------------------------------------- Left ventricle:  The cavity size was normal. Wall thickness was normal. Systolic function was normal. The estimated ejection fraction was in the range of 60% to 65%. Wall motion was normal; there were no regional wall motion abnormalities. Doppler parameters are consistent with  abnormal left ventricular relaxation (grade 1 diastolic dysfunction).  ------------------------------------------------------------------- Aortic valve:   Moderately calcified annulus. Mildly thickened leaflets.  Doppler:   There was no stenosis.  ------------------------------------------------------------------- Aorta:  Aortic root: The aortic root was normal in size. Ascending aorta: The ascending aorta was normal in size.  ------------------------------------------------------------------- Mitral valve:   Structurally normal valve.   Leaflet separation was normal.  Doppler:  Transvalvular velocity was within the normal range. There was no evidence for stenosis. There was no regurgitation.  ------------------------------------------------------------------- Left atrium:  The atrium was normal in size.  ------------------------------------------------------------------- Right ventricle:  The cavity size was normal. Systolic function was normal.  ------------------------------------------------------------------- Pulmonic valve:    The valve appears to be grossly normal. Doppler:  There was no significant regurgitation.  ------------------------------------------------------------------- Tricuspid valve:   Structurally normal valve. The valve appears to be grossly normal.   Leaflet separation was normal.  Doppler: Transvalvular velocity was within the normal range. There was trivial regurgitation.  ------------------------------------------------------------------- Pulmonary artery:   Systolic pressure was within the normal range.   ------------------------------------------------------------------- Right atrium:  The atrium was normal in size.  ------------------------------------------------------------------- Pericardium:  There was no pericardial effusion.  ------------------------------------------------------------------- Systemic veins: Inferior vena  cava: The vessel was normal in size. The respirophasic diameter changes were in the normal range (>= 50%), consistent with normal central venous pressure.  ------------------------------------------------------------------- Measurements   Left ventricle                         Value        Reference  LV ID, ED, PLAX chordal                45.9  mm     43 - 52  LV ID, ES, PLAX chordal                30.6  mm     23 - 38  LV fx shortening, PLAX chordal         33    %      >=29  LV PW thickness, ED                    11.8  mm     ----------  IVS/LV PW ratio, ED                    0.79         <=1.3  Stroke volume, 2D                      80    ml     ----------  Stroke volume/bsa, 2D                  35    ml/m^2 ----------  LV e&', lateral  8.27  cm/s   ----------  LV E/e&', lateral                       7.27         ----------  LV e&', medial                          5.98  cm/s   ----------  LV E/e&', medial                        10.05        ----------  LV e&', average                         7.13  cm/s   ----------  LV E/e&', average                       8.44         ----------    Ventricular septum                     Value        Reference  IVS thickness, ED                      9.31  mm     ----------    LVOT                                   Value        Reference  LVOT ID, S                             23    mm     ----------  LVOT area                              4.15  cm^2   ----------  LVOT peak velocity, S                  104   cm/s   ----------  LVOT mean velocity, S                  64.3  cm/s   ----------  LVOT VTI, S                            19.2  cm     ----------    Aorta                                  Value        Reference  Aortic root ID, ED                     38    mm     ----------  Ascending aorta ID, A-P, S             39    mm     ----------    Left atrium  Value        Reference  LA ID, A-P, ES                          44    mm     ----------  LA ID/bsa, A-P                         1.94  cm/m^2 <=2.2  LA volume, S                           47.6  ml     ----------  LA volume/bsa, S                       21    ml/m^2 ----------  LA volume, ES, 1-p A4C                 38.7  ml     ----------  LA volume/bsa, ES, 1-p A4C             17.1  ml/m^2 ----------  LA volume, ES, 1-p A2C                 54.5  ml     ----------  LA volume/bsa, ES, 1-p A2C             24.1  ml/m^2 ----------    Mitral valve                           Value        Reference  Mitral E-wave peak velocity            60.1  cm/s   ----------  Mitral A-wave peak velocity            75.7  cm/s   ----------  Mitral deceleration time       (H)     327   ms     150 - 230  Mitral E/A ratio, peak                 0.8          ----------    Pulmonary arteries                     Value        Reference  PA pressure, S, DP                     26    mm Hg  <=30    Tricuspid valve                        Value        Reference  Tricuspid regurg peak velocity         239   cm/s   ----------  Tricuspid peak RV-RA gradient          23    mm Hg  ----------    Right atrium                           Value        Reference  RA ID, S-I, ES, A4C  48.1  mm     34 - 49  RA area, ES, A4C                       14.6  cm^2   8.3 - 19.5  RA volume, ES, A/L                     36.6  ml     ----------  RA volume/bsa, ES, A/L                 16.2  ml/m^2 ----------    Systemic veins                         Value        Reference  Estimated CVP                          3     mm Hg  ----------    Right ventricle                        Value        Reference  TAPSE                                  28.1  mm     ----------  RV pressure, S, DP                     26    mm Hg  <=30  RV s&', lateral, S                      14.5  cm/s   ----------  Legend: (L)  and  (H)  mark values outside specified reference  range.  ------------------------------------------------------------------- Prepared and Electronically Authenticated by  Mertie Moores, M.D. 2019-07-15T14:03:02  [employer] Images   Show images for ECHOCARDIOGRAM COMPLETE  MERGE Images   Show images for ECHOCARDIOGRAM COMPLETE  Performing Technologist/Nurse   Performing Technologist/Nurse: Deliah Boston D  Reason for Exam  Priority: Routine  Dx: Type 2 diabetes mellitus with complication, without long-term current use of insulin ([place]) [E11.8 (ICD-10-CM)]; new onset Murmur, heart [R01.1 (ICD-10-CM)]; Hypertension associated with diabetes ([place]) [E11.59, I10 (ICD-10-CM)]; Hyperlipidemia associated with type 2 diabetes mellitus ([place]) [E11.69, E78.5 (ICD-10-CM)]; Obesity, Class I, BMI 30-34.9 [E66.9 (ICD-10-CM)]; Inactivity [Z72.3 (ICD-10-CM)]  Comments: Administer perflutren per protocol if applicable                       Surgical History   Surgical History   No past medical history on file.    Other Surgical History   Procedure Laterality Date Comment Source  INGUINAL HERNIA REPAIR Left 12-16-2010 dr Donne [facility]  Provider  INGUINAL HERNIA REPAIR Right 06/01/2017 Procedure: OPEN RIGHT INGUINAL HERNIA REPAIR WITH MESH; Surgeon: Kinsinger, Arta Bruce, MD; Location: [place]; Service: General; Laterality: Right; GENERAL AND TAP BLOCK Provider  INSERTION OF MESH Right 06/01/2017 Procedure: INSERTION OF MESH; Surgeon: Kinsinger, Arta Bruce, MD; Location: [place]; Service: General; Laterality: Right; GENERAL AND TAP BLOCK Provider    Implants    No active implants to display in this view.  Order-Level Documents:   There are no order-level documents.  Encounter-Level Documents - 01/01/2018:  Electronic signature on 01/01/2018 8:46 AM - Signed  Electronic signature on 01/01/2018 8:46 AM - Signed      Resulted by:   Signed Date/Time  Phone Pager  Thayer Headings  01/01/2018 2:03 PM [phone]   External Result Report   External Result Report

## 2018-07-31 NOTE — Patient Instructions (Addendum)
Your procedure is scheduled on: 08-02-18 THURSDAY Report to Same Day Surgery 2nd floor medical mall [facility] Entrance-take elevator on left to 2nd floor.  Check in with surgery information desk.) To find out your arrival time please call [phone] between 1PM - 3PM on 08-01-18 [facility]  Remember: Instructions that are not followed completely may result in serious medical risk, up to and including death, or upon the discretion of your surgeon and anesthesiologist your surgery may need to be rescheduled.    _x___ 1. Do not eat food after midnight the night before your procedure. NO GUM OR CANDY AFTER MIDNIGHT. You may drink WATER  up to 2 hours before you are scheduled to arrive at the hospital for your procedure.  Do not drink WATER within 2 hours of your scheduled arrival to the hospital.  TYPE 1 AND TYPE 2 DIABETICS CAN ONLY DRINK WATER  ____Ensure clear carbohydrate drink on the way to the hospital for bariatric patients  ____Ensure clear carbohydrate drink 3 hours before surgery for Dr Dwyane Luo patients if physician instructed.    __x__ 2. No Alcohol for 24 hours before or after surgery.   __x__3. No Smoking or e-cigarettes for 24 prior to surgery.  Do not use any chewable tobacco products for at least 6 hour prior to surgery   ____  4. Bring all medications with you on the day of surgery if instructed.    __x__ 5. Notify your doctor if there is any change in your medical condition     (cold, fever, infections).    x___6. On the morning of surgery brush your teeth with toothpaste and water.  You may rinse your mouth with mouth wash if you wish.  Do not swallow any toothpaste or mouthwash.   Do not wear jewelry, make-up, hairpins, clips or nail polish.  Do not wear lotions, powders, or perfumes. You may wear deodorant.  Do not shave 48 hours prior to surgery. Men may shave face and neck.  Do not bring valuables to the hospital.    [facility] is not responsible for any  belongings or valuables.               Contacts, dentures or bridgework may not be worn into surgery.  Leave your suitcase in the car. After surgery it may be brought to your room.  For patients admitted to the hospital, discharge time is determined by your treatment team.  _  Patients discharged the day of surgery will not be allowed to drive home.  You will need someone to drive you home and stay with you the night of your procedure.    Please read over the following fact sheets that you were given:   [facility] Preparing for Surgery   _x___ TAKE THE FOLLOWING MEDICATIONS THE MORNING OF SURGERY WITH A SMALL SIP OF WATER. These include:  1. ZOCOR (SIMVASTATIN)  2. METOPROLOL  3.  4.  5.  6.  ____Fleets enema or Magnesium Citrate as directed.   _x___ Use CHG Soap or sage wipes as directed on instruction sheet   ____ Use inhalers on the day of surgery and bring to hospital day of surgery  _X___ Stop Metformin 2 days prior to surgery-STOP KOMBIGLYZE NOW (LAST DOSE ON 08-01-18 AM DOSE)   ____ Take 1/2 of usual insulin dose the night before surgery and none on the morning     surgery.   ____ Follow recommendations from Cardiologist, Pulmonologist or PCP regarding  stopping Aspirin, Coumadin, Plavix ,Eliquis, Effient, or Pradaxa, and Pletal.  X____Stop Anti-inflammatories such as Advil, Aleve, Ibuprofen, Motrin, Naproxen, MELOXICAM, Naprosyn, Goodies powders or aspirin products NOW-OK to take Tylenol   _x___ Stop supplements until after surgery-STOP CINNAMON NOW-MAY RESUME AFTER SURGERY   ____ Bring C-Pap to the hospital.

## 2018-08-01 ENCOUNTER — Encounter
Admission: RE | Admit: 2018-08-01 | Discharge: 2018-08-01 | Disposition: A | Discharge status: Home / Self Care | Payer: BLUE CROSS/BLUE SHIELD | Source: Ambulatory Visit | Attending: Orthopedic Surgery | Admitting: Orthopedic Surgery

## 2018-08-01 ENCOUNTER — Other Ambulatory Visit: Payer: Self-pay

## 2018-08-01 DIAGNOSIS — E118 Type 2 diabetes mellitus with unspecified complications: Secondary | ICD-10-CM | POA: Insufficient documentation

## 2018-08-01 DIAGNOSIS — Z01818 Encounter for other preprocedural examination: Secondary | ICD-10-CM | POA: Diagnosis not present

## 2018-08-01 DIAGNOSIS — I10 Essential (primary) hypertension: Secondary | ICD-10-CM | POA: Diagnosis not present

## 2018-08-01 LAB — BASIC METABOLIC PANEL
Anion gap: 9 (ref 5–15)
BUN: 15 mg/dL (ref 6–20)
CO2: 27 mmol/L (ref 22–32)
Calcium: 10.1 mg/dL (ref 8.9–10.3)
Chloride: 103 mmol/L (ref 98–111)
Creatinine, Ser: 0.95 mg/dL (ref 0.61–1.24)
GFR calc Af Amer: >60 mL/min (ref >60)
GFR calc non Af Amer: >60 mL/min (ref >60)
Glucose, Bld: 124 mg/dL — ABNORMAL HIGH (ref 70–99)
Potassium: 3.2 mmol/L — ABNORMAL LOW (ref 3.5–5.1)
Sodium: 139 mmol/L (ref 135–145)

## 2018-08-01 LAB — CBC WITH DIFFERENTIAL/PLATELET
Abs Immature Granulocytes: 0.01 10*3/uL (ref 0.00–0.07)
Basophils Absolute: 0.1 10*3/uL (ref 0.0–0.1)
Basophils Relative: 1 %
Eosinophils Absolute: 0.5 10*3/uL (ref 0.0–0.5)
Eosinophils Relative: 5 %
HCT: 44.3 % (ref 39.0–52.0)
Hemoglobin: 15.2 g/dL (ref 13.0–17.0)
Immature Granulocytes: 0 %
Lymphocytes Relative: 35 %
Lymphs Abs: 3.1 10*3/uL (ref 0.7–4.0)
MCH: 29.2 pg (ref 26.0–34.0)
MCHC: 34.3 g/dL (ref 30.0–36.0)
MCV: 85 fL (ref 80.0–100.0)
Monocytes Absolute: 1 10*3/uL (ref 0.1–1.0)
Monocytes Relative: 11 %
Neutro Abs: 4.4 10*3/uL (ref 1.7–7.7)
Neutrophils Relative %: 48 %
Platelets: 254 10*3/uL (ref 150–400)
RBC: 5.21 MIL/uL (ref 4.22–5.81)
RDW: 11.9 % (ref 11.5–15.5)
WBC: 9 10*3/uL (ref 4.0–10.5)
nRBC: 0 % (ref 0.0–0.2)

## 2018-08-01 LAB — APTT: aPTT: 28 seconds (ref 24–36)

## 2018-08-01 LAB — PROTIME-INR
INR: 1.05
Prothrombin Time: 13.6 seconds (ref 11.4–15.2)

## 2018-08-01 MED ORDER — CEFAZOLIN SODIUM-DEXTROSE 2-4 GM/100ML-% IV SOLN
2.0000 g | INTRAVENOUS | Status: AC
  Administered 2018-08-02: 2 g via INTRAVENOUS

## 2018-08-02 ENCOUNTER — Ambulatory Visit: Payer: BLUE CROSS/BLUE SHIELD | Admitting: Anesthesiology

## 2018-08-02 ENCOUNTER — Other Ambulatory Visit: Payer: Self-pay

## 2018-08-02 ENCOUNTER — Encounter: Payer: Self-pay | Admitting: Anesthesiology

## 2018-08-02 ENCOUNTER — Encounter
Admission: RE | Disposition: A | Discharge status: Home / Self Care | Payer: Self-pay | Source: Home / Self Care | Attending: Orthopedic Surgery

## 2018-08-02 ENCOUNTER — Ambulatory Visit
Admission: RE | Admit: 2018-08-02 | Discharge: 2018-08-02 | Disposition: A | Discharge status: Home / Self Care | Payer: BLUE CROSS/BLUE SHIELD | Attending: Orthopedic Surgery | Admitting: Orthopedic Surgery

## 2018-08-02 DIAGNOSIS — I10 Essential (primary) hypertension: Secondary | ICD-10-CM | POA: Insufficient documentation

## 2018-08-02 DIAGNOSIS — Z791 Long term (current) use of non-steroidal anti-inflammatories (NSAID): Secondary | ICD-10-CM | POA: Insufficient documentation

## 2018-08-02 DIAGNOSIS — E785 Hyperlipidemia, unspecified: Secondary | ICD-10-CM | POA: Insufficient documentation

## 2018-08-02 DIAGNOSIS — Z7984 Long term (current) use of oral hypoglycemic drugs: Secondary | ICD-10-CM | POA: Insufficient documentation

## 2018-08-02 DIAGNOSIS — G5602 Carpal tunnel syndrome, left upper limb: Secondary | ICD-10-CM | POA: Diagnosis not present

## 2018-08-02 DIAGNOSIS — E119 Type 2 diabetes mellitus without complications: Secondary | ICD-10-CM | POA: Diagnosis not present

## 2018-08-02 DIAGNOSIS — Z79899 Other long term (current) drug therapy: Secondary | ICD-10-CM | POA: Insufficient documentation

## 2018-08-02 HISTORY — PX: CARPAL TUNNEL RELEASE: SHX101

## 2018-08-02 LAB — GLUCOSE, CAPILLARY
Glucose-Capillary: 162 mg/dL — ABNORMAL HIGH (ref 70–99)
Glucose-Capillary: 163 mg/dL — ABNORMAL HIGH (ref 70–99)

## 2018-08-02 LAB — POCT I-STAT 4, (NA,K, GLUC, HGB,HCT)
Glucose, Bld: 159 mg/dL — ABNORMAL HIGH (ref 70–99)
HCT: 41 % (ref 39.0–52.0)
Hemoglobin: 13.9 g/dL (ref 13.0–17.0)
Potassium: 3.8 mmol/L (ref 3.5–5.1)
Sodium: 139 mmol/L (ref 135–145)

## 2018-08-02 SURGERY — CARPAL TUNNEL RELEASE
Anesthesia: General | Site: Hand | Laterality: Left

## 2018-08-02 MED ORDER — FAMOTIDINE 20 MG PO TABS
ORAL_TABLET | ORAL | Status: AC
  Filled 2018-08-02: qty 1

## 2018-08-02 MED ORDER — FENTANYL CITRATE (PF) 100 MCG/2ML IJ SOLN
25.0000 ug | INTRAMUSCULAR | Status: DC | PRN
  Administered 2018-08-02 (×4): 25 ug via INTRAVENOUS

## 2018-08-02 MED ORDER — FAMOTIDINE 20 MG PO TABS
20.0000 mg | ORAL_TABLET | Freq: Once | ORAL | Status: AC
  Administered 2018-08-02: 20 mg via ORAL

## 2018-08-02 MED ORDER — EPHEDRINE SULFATE 50 MG/ML IJ SOLN
INTRAMUSCULAR | Status: AC
  Filled 2018-08-02: qty 1

## 2018-08-02 MED ORDER — MIDAZOLAM HCL 2 MG/2ML IJ SOLN
INTRAMUSCULAR | Status: DC | PRN
  Administered 2018-08-02: 2 mg via INTRAVENOUS

## 2018-08-02 MED ORDER — DEXAMETHASONE SODIUM PHOSPHATE 10 MG/ML IJ SOLN
INTRAMUSCULAR | Status: DC | PRN
  Administered 2018-08-02: 5 mg via INTRAVENOUS

## 2018-08-02 MED ORDER — ONDANSETRON HCL 4 MG/2ML IJ SOLN: 4.0000 mg | Freq: Once | INTRAMUSCULAR | Status: DC | PRN

## 2018-08-02 MED ORDER — OXYCODONE HCL 5 MG PO TABS
ORAL_TABLET | ORAL | Status: AC
  Filled 2018-08-02: qty 1

## 2018-08-02 MED ORDER — OXYCODONE HCL 5 MG PO TABS
5.0000 mg | ORAL_TABLET | ORAL | Status: DC | PRN
  Administered 2018-08-02: 5 mg via ORAL

## 2018-08-02 MED ORDER — LIDOCAINE HCL (CARDIAC) PF 100 MG/5ML IV SOSY
PREFILLED_SYRINGE | INTRAVENOUS | Status: DC | PRN
  Administered 2018-08-02: 40 mg via INTRAVENOUS
  Administered 2018-08-02: 60 mg via INTRAVENOUS

## 2018-08-02 MED ORDER — PHENYLEPHRINE HCL 10 MG/ML IJ SOLN
INTRAMUSCULAR | Status: AC
  Filled 2018-08-02: qty 1

## 2018-08-02 MED ORDER — CEFAZOLIN SODIUM-DEXTROSE 2-4 GM/100ML-% IV SOLN
INTRAVENOUS | Status: AC
  Filled 2018-08-02: qty 100

## 2018-08-02 MED ORDER — ONDANSETRON HCL 4 MG/2ML IJ SOLN
INTRAMUSCULAR | Status: AC
  Filled 2018-08-02: qty 2

## 2018-08-02 MED ORDER — FENTANYL CITRATE (PF) 100 MCG/2ML IJ SOLN
INTRAMUSCULAR | Status: AC
  Filled 2018-08-02: qty 2

## 2018-08-02 MED ORDER — SUCCINYLCHOLINE CHLORIDE 20 MG/ML IJ SOLN
INTRAMUSCULAR | Status: AC
  Filled 2018-08-02: qty 1

## 2018-08-02 MED ORDER — FENTANYL CITRATE (PF) 100 MCG/2ML IJ SOLN
INTRAMUSCULAR | Status: DC | PRN
  Administered 2018-08-02 (×2): 50 ug via INTRAVENOUS

## 2018-08-02 MED ORDER — SODIUM CHLORIDE 0.9 % IV SOLN
INTRAVENOUS | Status: DC
  Administered 2018-08-02: 08:00:00 via INTRAVENOUS

## 2018-08-02 MED ORDER — CHLORHEXIDINE GLUCONATE CLOTH 2 % EX PADS: 6.0000 | MEDICATED_PAD | Freq: Once | CUTANEOUS | Status: DC

## 2018-08-02 MED ORDER — ONDANSETRON HCL 4 MG/2ML IJ SOLN
INTRAMUSCULAR | Status: DC | PRN
  Administered 2018-08-02: 4 mg via INTRAVENOUS

## 2018-08-02 MED ORDER — EPHEDRINE SULFATE 50 MG/ML IJ SOLN
INTRAMUSCULAR | Status: DC | PRN
  Administered 2018-08-02: 10 mg via INTRAVENOUS

## 2018-08-02 MED ORDER — PROPOFOL 10 MG/ML IV BOLUS
INTRAVENOUS | Status: DC | PRN
  Administered 2018-08-02: 170 mg via INTRAVENOUS
  Administered 2018-08-02: 30 mg via INTRAVENOUS

## 2018-08-02 MED ORDER — SODIUM CHLORIDE (PF) 0.9 % IJ SOLN
INTRAMUSCULAR | Status: AC
  Filled 2018-08-02: qty 10

## 2018-08-02 MED ORDER — FENTANYL CITRATE (PF) 100 MCG/2ML IJ SOLN
INTRAMUSCULAR | Status: AC
  Administered 2018-08-02: 25 ug via INTRAVENOUS
  Filled 2018-08-02: qty 2

## 2018-08-02 MED ORDER — OXYCODONE HCL 5 MG PO TABS: 5.0000 mg | ORAL_TABLET | ORAL | 0 refills | Status: DC | PRN

## 2018-08-02 MED ORDER — CHLORHEXIDINE GLUCONATE CLOTH 2 % EX PADS
6.0000 | MEDICATED_PAD | Freq: Once | CUTANEOUS | Status: AC
  Administered 2018-08-02: 6 via TOPICAL

## 2018-08-02 MED ORDER — PROPOFOL 10 MG/ML IV BOLUS
INTRAVENOUS | Status: AC
  Filled 2018-08-02: qty 20

## 2018-08-02 MED ORDER — DEXAMETHASONE SODIUM PHOSPHATE 10 MG/ML IJ SOLN
INTRAMUSCULAR | Status: AC
  Filled 2018-08-02: qty 1

## 2018-08-02 MED ORDER — SODIUM CHLORIDE 0.9 % IV SOLN
INTRAVENOUS | Status: DC | PRN
  Administered 2018-08-02: 10:00:00

## 2018-08-02 MED ORDER — ONDANSETRON HCL 4 MG PO TABS: 4.0000 mg | ORAL_TABLET | Freq: Three times a day (TID) | ORAL | 0 refills | Status: DC | PRN

## 2018-08-02 MED ORDER — MIDAZOLAM HCL 2 MG/2ML IJ SOLN
INTRAMUSCULAR | Status: AC
  Filled 2018-08-02: qty 2

## 2018-08-02 MED ORDER — GENTAMICIN SULFATE 40 MG/ML IJ SOLN
INTRAMUSCULAR | Status: AC
  Filled 2018-08-02: qty 2

## 2018-08-02 SURGICAL SUPPLY — 48 items
BANDAGE ELASTIC 4 LF NS (GAUZE/BANDAGES/DRESSINGS) ×4 IMPLANT
BLADE SURG MINI STRL (BLADE) ×2 IMPLANT
BNDG CMPR MED 5X4 ELC HKLP NS (GAUZE/BANDAGES/DRESSINGS) ×2
BNDG ESMARK 4X12 TAN STRL LF (GAUZE/BANDAGES/DRESSINGS) ×2 IMPLANT
CANISTER SUCT 1200ML W/VALVE (MISCELLANEOUS) ×2 IMPLANT
CORD BIP STRL DISP 12FT (MISCELLANEOUS) ×2 IMPLANT
COVER WAND RF STERILE (DRAPES) ×1 IMPLANT
CUFF TOURN 18 STER (MISCELLANEOUS) ×1 IMPLANT
DRAPE IMP U-DRAPE 54X76 (DRAPES) ×2 IMPLANT
DRAPE SURG 17X11 SM STRL (DRAPES) ×2 IMPLANT
DRSG GAUZE FLUFF 36X18 (GAUZE/BANDAGES/DRESSINGS) ×2 IMPLANT
DURAPREP 26ML APPLICATOR (WOUND CARE) ×4 IMPLANT
ELECT REM PT RETURN 9FT ADLT (ELECTROSURGICAL) ×2
ELECTRODE REM PT RTRN 9FT ADLT (ELECTROSURGICAL) ×1 IMPLANT
FORCEPS JEWEL BIP 4-3/4 STR (INSTRUMENTS) ×2 IMPLANT
GAUZE PETRO XEROFOAM 1X8 (MISCELLANEOUS) ×2 IMPLANT
GAUZE SPONGE 4X4 12PLY STRL (GAUZE/BANDAGES/DRESSINGS) ×2 IMPLANT
GLOVE BIOGEL PI IND STRL 7.5 (GLOVE) IMPLANT
GLOVE BIOGEL PI IND STRL 9 (GLOVE) ×1 IMPLANT
GLOVE BIOGEL PI INDICATOR 7.5 (GLOVE) ×6
GLOVE BIOGEL PI INDICATOR 9 (GLOVE) ×1
GLOVE SURG 9.0 ORTHO LTXF (GLOVE) ×4 IMPLANT
GOWN STRL REUS TWL 2XL XL LVL4 (GOWN DISPOSABLE) ×2 IMPLANT
GOWN STRL REUS W/ TWL LRG LVL3 (GOWN DISPOSABLE) ×1 IMPLANT
GOWN STRL REUS W/TWL LRG LVL3 (GOWN DISPOSABLE) ×6
KIT TURNOVER KIT A (KITS) ×2 IMPLANT
NDL FILTER BLUNT 18X1 1/2 (NEEDLE) ×1 IMPLANT
NEEDLE FILTER BLUNT 18X 1/2SAF (NEEDLE) ×1
NEEDLE FILTER BLUNT 18X1 1/2 (NEEDLE) ×1 IMPLANT
NS IRRIG 500ML POUR BTL (IV SOLUTION) ×2 IMPLANT
PACK EXTREMITY ARMC (MISCELLANEOUS) ×2 IMPLANT
PAD CAST CTTN 4X4 STRL (SOFTGOODS) ×2 IMPLANT
PADDING CAST 4IN STRL (MISCELLANEOUS) ×3
PADDING CAST BLEND 4X4 STRL (MISCELLANEOUS) ×3 IMPLANT
PADDING CAST COTTON 4X4 STRL (SOFTGOODS) ×4
SLING ARM LRG DEEP (SOFTGOODS) ×2 IMPLANT
SLING ARM M TX990204 (SOFTGOODS) ×1 IMPLANT
SPLINT CAST 1 STEP 3X12 (MISCELLANEOUS) ×2 IMPLANT
STOCKINETTE 48X4 2 PLY STRL (GAUZE/BANDAGES/DRESSINGS) ×1 IMPLANT
STOCKINETTE STRL 4IN 9604848 (GAUZE/BANDAGES/DRESSINGS) ×2 IMPLANT
STRIP CLOSURE SKIN 1/2X4 (GAUZE/BANDAGES/DRESSINGS) ×2 IMPLANT
SUT ETHILON 3-0 FS-10 30 BLK (SUTURE) ×2
SUT ETHILON 4-0 (SUTURE) ×2
SUT ETHILON 4-0 FS2 18XMFL BLK (SUTURE) ×1
SUT ETHILON 5-0 FS-2 18 BLK (SUTURE) ×2 IMPLANT
SUTURE EHLN 3-0 FS-10 30 BLK (SUTURE) ×1 IMPLANT
SUTURE ETHLN 4-0 FS2 18XMF BLK (SUTURE) ×1 IMPLANT
SYR 3ML LL SCALE MARK (SYRINGE) ×2 IMPLANT

## 2018-08-02 NOTE — Discharge Instructions (Signed)

## 2018-08-02 NOTE — Progress Notes (Signed)
Ch visited pt pre-op with wife present. Pt shared that he fell a few months ago and experienced a lot of numbness in his left arm. Pt shared that he was surprised to be diagnosed  w/ carpal tunnel but glad that he is having the procedure today. Ch conversed a joked w/ pt and wife and then lead them in a prayer for restoration of health. Pt was thankful of visit. No further needs at this time.     08/02/18 0735  Clinical Encounter Type  Visited With Patient and family together  Visit Type Psychological support;Spiritual support;Social support;Pre-op  Spiritual Encounters  Spiritual Needs Emotional;Grief support;Prayer  Stress Factors  Patient Stress Factors None identified  Family Stress Factors None identified

## 2018-08-02 NOTE — H&P (Signed)
PREOPERATIVE H&P  Chief Complaint: CARPAL TUNNEL SYNDROME OF LEFT WRIST  HPI: Russell Robles is a 58 y.o. male who presents for preoperative history and physical with a diagnosis of CARPAL TUNNEL SYNDROME OF LEFT WRIST.   Symptoms include numbness and tingling in the thumb index and middle finger with pain which radiates up his left upper extremity.  Patient has tried and failed nonoperative management.  Carpal tunnel syndrome was confirmed on EMG/nerve conduction study.  Past Medical History:  Diagnosis Date  . Anxiety   . Arthritis   . Cough    PER PT NON-PRODUCTIVE , NO FEVER OR CONGESTION  . Hernia, inguinal, right   . Hyperlipidemia   . Hypertension   . Type 2 diabetes mellitus ([place])    last  HbA1c 7.2 on 05-09-2017 in epic   Past Surgical History:  Procedure Laterality Date  . INGUINAL HERNIA REPAIR Left 12-16-2010  dr Donne Hazel  [facility]  . INGUINAL HERNIA REPAIR Right 06/01/2017   Procedure: OPEN RIGHT INGUINAL HERNIA REPAIR WITH MESH;  Surgeon: Kinsinger, Arta Bruce, MD;  Location: [place];  Service: General;  Laterality: Right;  GENERAL AND TAP BLOCK  . INSERTION OF MESH Right 06/01/2017   Procedure: INSERTION OF MESH;  Surgeon: Kinsinger, Arta Bruce, MD;  Location: [facility];  Service: General;  Laterality: Right;  GENERAL AND TAP BLOCK   Social History   Socioeconomic History  . Marital status: Married    Spouse name: Not on file  . Number of children: Not on file  . Years of education: Not on file  . Highest education level: Not on file  Occupational History  . Not on file  Social Needs  . Financial resource strain: Not on file  . Food insecurity:    Worry: Not on file    Inability: Not on file  . Transportation needs:    Medical: Not on file    Non-medical: Not on file  Tobacco Use  . Smoking status: Never Smoker  . Smokeless tobacco: Never Used  Substance and Sexual Activity  . Alcohol use: Yes    Frequency: Never     Comment: occ  . Drug use: No  . Sexual activity: Yes    Partners: Female    Birth control/protection: None  Lifestyle  . Physical activity:    Days per week: Not on file    Minutes per session: Not on file  . Stress: Not on file  Relationships  . Social connections:    Talks on phone: Not on file    Gets together: Not on file    Attends religious service: Not on file    Active member of club or organization: Not on file    Attends meetings of clubs or organizations: Not on file    Relationship status: Not on file  Other Topics Concern  . Not on file  Social History Narrative  . Not on file   Family History  Problem Relation Age of Onset  . COPD Mother   . Aortic aneurysm Father   . Lung cancer Sister    No Known Allergies Prior to Admission medications   Medication Sig Start Date End Date Taking? Authorizing Provider  hydrochlorothiazide (MICROZIDE) 12.5 MG capsule Take 12.5 mg by mouth daily.   Yes [provider]  losartan (COZAAR) 100 MG tablet Take 100 mg by mouth daily.   Yes [provider]  meloxicam (MOBIC) 15 MG tablet Take 15 mg by mouth  daily.   Yes [provider]  metoprolol succinate (TOPROL-XL) 50 MG 24 hr tablet Take 1 tablet (50 mg total) by mouth every morning. 09/12/17  Yes Opalski, Deborah, DO  Saxagliptin-Metformin 2.10-998 MG TB24 Take 1 tablet by mouth daily. 06/19/18  Yes Danford, Valetta Fuller D, NP  simvastatin (ZOCOR) 80 MG tablet Take 1 tablet (80 mg total) by mouth at bedtime. Patient taking differently: Take 80 mg by mouth every morning.  09/12/17  Yes Opalski, Deborah, DO  VICTOZA 18 MG/3ML SOPN INJECT 0.6 MG SUBCUTANEOUSLY ONCE DAILY FOR 1 WEEK, THEN 1.2 MG ONCE DAILY. Patient taking differently: Inject 1.2 mg into the skin every morning.  06/06/18  Yes Opalski, Deborah, DO  CINNAMON PO Take 2 capsules by mouth daily.    [provider]  Cyanocobalamin (VITAMIN B12 PO) Take 1 tablet by mouth daily.    [provider]  naproxen sodium (ALEVE) 220 MG tablet Take 220 mg by mouth as needed.    [provider]     Positive ROS: All other systems have been reviewed and were otherwise negative with the exception of those mentioned in the HPI and as above.  Physical Exam: General: Alert, no acute distress Cardiovascular: Regular rate and rhythm, no murmurs rubs or gallops.  No pedal edema Respiratory: Clear to auscultation bilaterally, no wheezes rales or rhonchi. No cyanosis, no use of accessory musculature GI: No organomegaly, abdomen is soft and non-tender nondistended with positive bowel sounds. Skin: Skin intact, no lesions within the operative field. Neurologic: Sensation intact distally Psychiatric: Patient is competent for consent with normal mood and affect Lymphatic: No cervical lymphadenopathy  MUSCULOSKELETAL: Left hand: Patient skin is intact.  There is no erythema ecchymosis or swelling.  Patient has intact sensation to light touch in all 5 digits and his fingers well-perfused.  He has paresthesias in his thumb index and middle finger.  There is no thenar or hyperthenar atrophy.  Assessment: CARPAL TUNNEL SYNDROME OF LEFT WRIST  Plan: Plan for Procedure(s): LEFT OPEN CARPAL TUNNEL RELEASE  I discussed the details of the operation as well as the postoperative course with the patient and his wife who was at the bedside.  I discussed the risks and benefits of surgery. They understand risks include but are not limited to infection, bleeding, nerve or blood vessel injury, joint stiffness or loss of motion, persistent pain, weakness or instability, and the need for further surgery. Medical risks include but are not limited to DVT and pulmonary embolism, myocardial infarction, stroke, pneumonia, respiratory failure and death. Patient understood these risks and wished to proceed.     Thornton Park, MD   08/02/2018 9:05 AM

## 2018-08-02 NOTE — Op Note (Signed)
08/02/2018  10:27 AM  PATIENT:  Russell Robles    PRE-OPERATIVE DIAGNOSIS:  CARPAL TUNNEL SYNDROME OF LEFT WRIST  POST-OPERATIVE DIAGNOSIS:  Same  PROCEDURE:  LEFT OPEN CARPAL TUNNEL RELEASE  SURGEON:  Thornton Park, MD  ANESTHESIA:   General  PREOPERATIVE INDICATIONS:  [name] is a  58 y.o. male with a diagnosis of CARPAL TUNNEL SYNDROME OF LEFT WRIST who failed conservative measures and elected for surgical management.    I discussed the risks and benefits of surgery. The risks include but are not limited to infection, bleeding, nerve or blood vessel injury, joint stiffness or loss of motion, persistent pain, weakness, recurrence of symptoms and the need for further surgery. Medical risks include but are not limited to DVT and pulmonary embolism, myocardial infarction, stroke, pneumonia, respiratory failure and death. Patient understood these risks and wished to proceed.   OPERATIVE FINDINGS: Significant median nerve compression at the carpal tunnel, left upper extremity  OPERATIVE PROCEDURE: Patient was met in the preoperative area. I signed the left wrist with my initials and the word yes according the hospital's correct site of surgery protocol.  Patient's wife was at the bedside.  A preop H&P was formed. I answered all the patient's questions.  He was then brought to the operating room where he underwent general with LMA.  The patient was positioned supine on the operative table. The left arm was placed on a hand table. A tourniquet was applied to the left upper extremity.  He was prepped and draped in a sterile fashion. A timeout was performed to verify the patient's name, date of birth, medical record number, correct site of surgery correct procedure to be performed. The time out was also used to confirm the patient received antibiotics that all necessary instruments were available in the room. The left upper extremity was then exsanguinated with an Esmarch and the  tourniquet inflated to 250 mmHg for 31 minutes.   An incision following the palmar crease was made. This was made in line with the web space between the middle and ring fingers and the distal extent of the incision was where it intersected Kaplan's cardinal line. Bleeding vessels were cauterized with a bipolar.  The subcutaneous tissue was carefully dissected out with a Metzenbaum scissor and pickup until the palmar fascia was encountered. The distal extent of the transverse carpal ligament was then identified. A Freer elevator was placed under the transverse carpal ligament running distally to proximally. A micro-Beaver blade was then used to incise the transverse carpal ligament taking care to avoid injury to any neurovascular structures. The carpal tunnel was found to be extremely constricted. There was significant compression on the median nerve. The transverse carpal ligament was completely released. The nerve was visualized in its entirety and the carpal tunnel. The wound was copiously irrigated. The skin was then approximated with 5-0 nylon. Xeroform was placed over the incision. A dry sterile dressing was applied along with a volar fiberglass splint. Patient was overwrapped with an Ace wrap. The tourniquet was deflated at 31 minutes.  Sling was placed on the left upper extremity.  The patient was extubated and brought to the PACU in stable condition. I was scrubbed and present the entire case and all sharp and instrument counts were correct at the conclusion the case.  I spoke with the patient's wife in the postop consultation room to let her know the patient was stable in recovery and the case was performed without complication.  Timoteo Gaul, MD

## 2018-08-02 NOTE — Anesthesia Procedure Notes (Deleted)
Performed by: Shantay Sonn, CRNA       

## 2018-08-02 NOTE — Anesthesia Post-op Follow-up Note (Signed)
Anesthesia QCDR form completed.        

## 2018-08-02 NOTE — Anesthesia Procedure Notes (Addendum)
Procedure Name: LMA Insertion Date/Time: 08/02/2018 9:15 AM Performed by: Launa Grill, RN Pre-anesthesia Checklist: Patient identified, Patient being monitored, Timeout performed, Emergency Drugs available and Suction available Patient Re-evaluated:Patient Re-evaluated prior to induction Oxygen Delivery Method: Circle system utilized Preoxygenation: Pre-oxygenation with 100% oxygen Induction Type: IV induction Ventilation: Mask ventilation without difficulty LMA: LMA inserted LMA Size: 5.0 Tube type: Oral Number of attempts: 1 Placement Confirmation: positive ETCO2 and breath sounds checked- equal and bilateral Tube secured with: Tape Dental Injury: Teeth and Oropharynx as per pre-operative assessment

## 2018-08-02 NOTE — Transfer of Care (Signed)
Immediate Anesthesia Transfer of Care Note  Patient: Russell Robles  Procedure(s) Performed: CARPAL TUNNEL RELEASE (Left Hand)  Patient Location: PACU  Anesthesia Type:General  Level of Consciousness: sedated  Airway & Oxygen Therapy: Patient Spontanous Breathing and Patient connected to face mask oxygen  Post-op Assessment: Report given to RN and Post -op Vital signs reviewed and stable  Post vital signs: Reviewed  Last Vitals:  Vitals Value Taken Time  BP 97/66 08/02/2018 10:15 AM  Temp 36.2 C 08/02/2018 10:13 AM  Pulse 62 08/02/2018 10:15 AM  Resp 13 08/02/2018 10:15 AM  SpO2 97 % 08/02/2018 10:15 AM  Vitals shown include unvalidated device data.  Last Pain:  Vitals:   08/02/18 0719  TempSrc: Temporal  PainSc: 0-No pain         Complications: No apparent anesthesia complications

## 2018-08-02 NOTE — Anesthesia Preprocedure Evaluation (Signed)
Anesthesia Evaluation  Patient identified by MRN, date of birth, ID band Patient awake    Reviewed: Allergy & Precautions, H&P , NPO status , Patient's Chart, lab work & pertinent test results  Airway Mallampati: II   Neck ROM: full    Dental   Pulmonary neg pulmonary ROS,    breath sounds clear to auscultation       Cardiovascular hypertension, + Valvular Problems/Murmurs  Rhythm:regular Rate:Normal     Neuro/Psych Anxiety    GI/Hepatic negative GI ROS, Neg liver ROS,   Endo/Other  diabetes, Type 2  Renal/GU negative Renal ROS     Musculoskeletal  (+) Arthritis , Osteoarthritis,    Abdominal   Peds  Hematology negative hematology ROS (+)   Anesthesia Other Findings Past Medical History: No date: Anxiety No date: Arthritis No date: Cough     Comment:  PER PT NON-PRODUCTIVE , NO FEVER OR CONGESTION No date: Hernia, inguinal, right No date: Hyperlipidemia No date: Hypertension No date: Type 2 diabetes mellitus ([place])     Comment:  last  HbA1c 7.2 on 05-09-2017 in epic  Reproductive/Obstetrics                             Anesthesia Physical  Anesthesia Plan  ASA: II  Anesthesia Plan: General   Post-op Pain Management:  Regional for Post-op pain   Induction: Intravenous  PONV Risk Score and Plan: 2 and Ondansetron, Dexamethasone, Midazolam and Treatment may vary due to age or medical condition  Airway Management Planned: LMA  Additional Equipment:   Intra-op Plan:   Post-operative Plan: Extubation in OR  Informed Consent: I have reviewed the patients History and Physical, chart, labs and discussed the procedure including the risks, benefits and alternatives for the proposed anesthesia with the patient or authorized representative who has indicated his/her understanding and acceptance.       Plan Discussed with: CRNA, Anesthesiologist and Surgeon  Anesthesia Plan  Comments:         Anesthesia Quick Evaluation

## 2018-08-03 NOTE — Anesthesia Postprocedure Evaluation (Signed)
Anesthesia Post Note  Patient: Russell Robles  Procedure(s) Performed: CARPAL TUNNEL RELEASE (Left Hand)  Patient location during evaluation: PACU Anesthesia Type: General Level of consciousness: awake and alert and oriented Pain management: pain level controlled Vital Signs Assessment: post-procedure vital signs reviewed and stable Respiratory status: spontaneous breathing Cardiovascular status: blood pressure returned to baseline Anesthetic complications: no     Last Vitals:  Vitals:   08/02/18 1125 08/02/18 1212  BP:  134/78  Pulse:  72  Resp: 19 18  Temp:  (!) 36.2 C  SpO2:  95%    Last Pain:  Vitals:   08/03/18 0900  TempSrc:   PainSc: 0-No pain                 Gawain Crombie

## 2018-08-08 DIAGNOSIS — G5602 Carpal tunnel syndrome, left upper limb: Secondary | ICD-10-CM | POA: Diagnosis not present

## 2018-08-09 DIAGNOSIS — G5602 Carpal tunnel syndrome, left upper limb: Secondary | ICD-10-CM | POA: Diagnosis not present

## 2018-08-10 DIAGNOSIS — L570 Actinic keratosis: Secondary | ICD-10-CM | POA: Diagnosis not present

## 2018-08-10 DIAGNOSIS — Z1283 Encounter for screening for malignant neoplasm of skin: Secondary | ICD-10-CM | POA: Diagnosis not present

## 2018-08-10 DIAGNOSIS — L739 Follicular disorder, unspecified: Secondary | ICD-10-CM | POA: Diagnosis not present

## 2018-08-10 DIAGNOSIS — L578 Other skin changes due to chronic exposure to nonionizing radiation: Secondary | ICD-10-CM | POA: Diagnosis not present

## 2018-08-10 DIAGNOSIS — L918 Other hypertrophic disorders of the skin: Secondary | ICD-10-CM | POA: Diagnosis not present

## 2018-08-16 ENCOUNTER — Other Ambulatory Visit: Payer: Self-pay | Admitting: Family Medicine

## 2018-08-16 DIAGNOSIS — E119 Type 2 diabetes mellitus without complications: Secondary | ICD-10-CM

## 2018-08-25 ENCOUNTER — Other Ambulatory Visit: Payer: Self-pay | Admitting: Family Medicine

## 2018-08-25 ENCOUNTER — Other Ambulatory Visit: Payer: Self-pay | Admitting: Adult Health

## 2018-08-25 DIAGNOSIS — I152 Hypertension secondary to endocrine disorders: Secondary | ICD-10-CM

## 2018-08-25 DIAGNOSIS — E1159 Type 2 diabetes mellitus with other circulatory complications: Secondary | ICD-10-CM

## 2018-08-25 DIAGNOSIS — I10 Essential (primary) hypertension: Principal | ICD-10-CM

## 2018-09-25 ENCOUNTER — Ambulatory Visit (INDEPENDENT_AMBULATORY_CARE_PROVIDER_SITE_OTHER): Payer: BLUE CROSS/BLUE SHIELD | Admitting: Family Medicine

## 2018-09-25 ENCOUNTER — Other Ambulatory Visit: Payer: Self-pay

## 2018-09-25 ENCOUNTER — Encounter: Payer: Self-pay | Admitting: Family Medicine

## 2018-09-25 VITALS — BP 151/81 | Ht 68.0 in | Wt 219.0 lb

## 2018-09-25 DIAGNOSIS — S46012D Strain of muscle(s) and tendon(s) of the rotator cuff of left shoulder, subsequent encounter: Secondary | ICD-10-CM

## 2018-09-25 DIAGNOSIS — E1159 Type 2 diabetes mellitus with other circulatory complications: Secondary | ICD-10-CM

## 2018-09-25 DIAGNOSIS — E66811 Obesity, class 1: Secondary | ICD-10-CM

## 2018-09-25 DIAGNOSIS — E118 Type 2 diabetes mellitus with unspecified complications: Secondary | ICD-10-CM | POA: Diagnosis not present

## 2018-09-25 DIAGNOSIS — E669 Obesity, unspecified: Secondary | ICD-10-CM

## 2018-09-25 DIAGNOSIS — I10 Essential (primary) hypertension: Secondary | ICD-10-CM | POA: Diagnosis not present

## 2018-09-25 DIAGNOSIS — I152 Hypertension secondary to endocrine disorders: Secondary | ICD-10-CM

## 2018-09-25 MED ORDER — EMPAGLIFLOZIN 25 MG PO TABS: 12.5000 mg | ORAL_TABLET | Freq: Every day | ORAL | 0 refills | Status: DC

## 2018-09-25 MED ORDER — METFORMIN HCL 500 MG PO TABS: ORAL_TABLET | ORAL | 1 refills | Status: DC

## 2018-09-25 MED ORDER — LIRAGLUTIDE 18 MG/3ML ~~LOC~~ SOPN: 1.8000 mg | PEN_INJECTOR | SUBCUTANEOUS | 1 refills | Status: DC

## 2018-09-25 NOTE — Progress Notes (Signed)
Virtual Visit via Telephone Note for [employer], D.O- Primary Care Physician at [facility]   I connected with current patient today by telephone and verified that I am speaking with the correct person using two identifiers.   Because of federal recommendations of social distancing due to the current novel COVID-19 outbreak, an audio/video telehealth visit is felt to be most appropriate for this patient at this time.  My staff members also discussed with the patient that there may be a patient charge related to this service.   The patient expressed understanding, and agreed to proceed.  Test test    History of Present Illness:   Carpal tunnel sx on L recently -- still w L shoulder pain      HTN:    -  blood pressures have not been well controlled lately.  Since starting the [place]pine, which is been the only medication change made over the past several months, blood pressures have increased significantly.  151/81,  190/90 - historically BP's were all running in the low 130's/ low 80's     Obesity:  - Patient has actually lost 11 pounds since last seen.  He has been exercising on the elliptical as well as changing his diet    DM HPI: Kombiglyze - which pt had to change to due to insurance has not been good for pt--->   made his BS and BP rise to much higher levels than prior when he was on jentadueto.   -  He has been working on diet and exercise for diabetes-   more specifically, he has transitioned to all diet sodas and now drinks diet [name].  He has been going on his elliptical machine at home as he said he would. -jenudueto med combo worked much better  Pt is currently maintained on the following medications for diabetes:   see med list today  Medication compliance -good.  Home glucose readings range   FBS:136 on 1 occasion- 221; mostly in the high 170s 180s but can go up to over 200 on many occ    Denies polyuria/polydipsia.  Denies hypo/  hyperglycemia symptoms  Last diabetic eye exam was No results found for: HMDIABEYEEXA  Foot exam- UTD  Last A1C in the office was:  Lab Results  Component Value Date   HGBA1C 6.9 (A) 06/26/2018   HGBA1C 6.5 (H) 03/19/2018   HGBA1C 6.3 (A) 12/19/2017    Lab Results  Component Value Date   MICROALBUR 30 06/26/2018   LDLCALC 72 03/19/2018   CREATININE 0.95 08/01/2018    Wt Readings from Last 3 Encounters:  09/25/18 219 lb (99.3 kg)  08/02/18 230 lb 9.6 oz (104.6 kg)  06/26/18 230 lb 9.6 oz (104.6 kg)    BP Readings from Last 3 Encounters:  09/25/18 (!) 151/81  08/02/18 134/78  06/26/18 132/88     Wt Readings from Last 3 Encounters:  09/25/18 219 lb (99.3 kg)  08/02/18 230 lb 9.6 oz (104.6 kg)  06/26/18 230 lb 9.6 oz (104.6 kg)    BP Readings from Last 3 Encounters:  09/25/18 (!) 151/81  08/02/18 134/78  06/26/18 132/88    Pulse Readings from Last 3 Encounters:  08/02/18 72  06/26/18 64  04/16/18 67    BMI Readings from Last 3 Encounters:  09/25/18 33.30 kg/m  08/02/18 35.06 kg/m  06/26/18 35.06 kg/m     -Vitals obtained; Medications, allergies reconciled;  personal medical, social, Sx etc. etc. histories were  updated by Lanier Prude the medical assistant today and are reflected in below chart    Patient Care Team    Relationship Specialty Notifications Start End  Mellody Dance, DO PCP - General Family Medicine  03/01/17      Patient Active Problem List   Diagnosis Date Noted  . Type 2 diabetes mellitus without retinopathy ([place]) 02/01/2018    Priority: High  . Diabetes mellitus type 2 with complications ([place]) 16/03/9603    Priority: High  . Hypertension associated with diabetes ([place]) 06/18/2015    Priority: High  . Hyperlipidemia associated with type 2 diabetes mellitus ([place]) 06/18/2015    Priority: High  . Obesity, Class I, BMI 30-34.9 04/12/2017    Priority: Medium  . Inactivity 04/12/2017    Priority: Low  . Age-related nuclear  cataract of both eyes 06/18/2015    Priority: Low  . Strain of rotator cuff capsule 04/17/2018  . Impingement syndrome of shoulder region, left 04/16/2018  . Bursitis of shoulder, left 04/16/2018  . Cortical age-related cataract of both eyes 02/01/2018  . Dermatochalasis of both upper eyelids 02/01/2018  . Nuclear sclerotic cataract of both eyes 02/01/2018  . new onset Murmur, heart 12/19/2017  . Acute pain of left shoulder 10/17/2017  . Presbyopia 06/18/2015  . Inguinal hernia unilateral, non-recurrent 12/13/2010     Current Meds  Medication Sig  . CINNAMON PO Take 2 capsules by mouth daily.  . Cyanocobalamin (VITAMIN B12 PO) Take 1 tablet by mouth daily.  . hydrochlorothiazide (HYDRODIURIL) 12.5 MG tablet Take 1 tablet by mouth once daily  . liraglutide (VICTOZA) 18 MG/3ML SOPN Inject 0.3 mLs (1.8 mg total) into the skin every morning.  Marland Kitchen losartan (COZAAR) 100 MG tablet Take 1 tablet by mouth once daily  . meloxicam (MOBIC) 15 MG tablet Take 7.5 mg by mouth 2 (two) times daily.  . metoprolol succinate (TOPROL-XL) 50 MG 24 hr tablet Take 1 tablet (50 mg total) by mouth every morning.  . ondansetron (ZOFRAN) 4 MG tablet Take 1 tablet (4 mg total) by mouth every 8 (eight) hours as needed for nausea or vomiting.  Marland Kitchen oxyCODONE (OXY IR/ROXICODONE) 5 MG immediate release tablet Take 1 tablet (5 mg total) by mouth every 4 (four) hours as needed.  . simvastatin (ZOCOR) 80 MG tablet Take 1 tablet (80 mg total) by mouth at bedtime. (Patient taking differently: Take 80 mg by mouth every morning. )  . [DISCONTINUED] KOMBIGLYZE XR 2.10-998 MG TB24 TAKE 1 TABLET BY MOUTH DAILY.  . [DISCONTINUED] liraglutide (VICTOZA) 18 MG/3ML SOPN Inject 0.2 mLs (1.2 mg total) into the skin every morning.     Allergies:  No Known Allergies   ROS:  See above HPI for pertinent positives and negatives   Objective:   Blood pressure (!) 151/81, height 5\' 8"  (1.727 m), weight 219 lb (99.3 kg). General: sounds  in no acute distress.  Skin: Pt confirms warm and dry  extremities and pink fingertips Respiratory: speaking in full sentences, no conversational dyspnea Psych: A and O *3, appears insight good, mood- full      Impression and Recommendations:     1. Diabetes mellitus type 2 with complications ([place])-    worsening acutely   -Group her blood sugars being so high on the DPP 4, we will discontinue the Kombiglyze and increase metformin dose separately; and switch to a SGLT2 inhibitor as per below.  -Patient will check his fasting sugars as well as 2-hour postprandial.  -After 3 months of this  newer dose, we will need to check his A1c. -All other labs were checked back in March 19, 2018 and were all stable.  -Increase liraglutide (VICTOZA) 18 MG/3ML SOPN; Inject 0.3 mLs (1.8 mg total) into the skin every morning.  Dispense: 27 mL; Refill: 1 -Increase metFORMIN (GLUCOPHAGE) 500 MG tablet; 2 tabs in am and 3 tabs in evening  Dispense: 450 tablet; Refill: 1 -Changed to empagliflozin (JARDIANCE) 25 MG TABS tablet; Take 12.5 mg by mouth daily.  Dispense: 45 tablet; Refill: 0 -Discontinued Kombiglyze -Follow-up 4 weeks due to extensive med changes.    2. Hypertension associated with diabetes ([place])-  acutely worsening despite dietary and lifestyle changes  Per patient he thinks the Kombiglyze made his blood pressure increase  -We will see if getting better blood sugar control will help him feel more relaxed and lower his blood pressure.  -We will go over his home blood pressure log in 4 weeks when we follow-up to see how he is doing with the diabetes medicine changes  -Patient understands importance of low-salt diet to help control blood pressure.  -If blood pressure not improved in 1 month, we will make changes to his BP medications.     3. Obesity, Class I, BMI 30-34.9  Patient has lost 11 pounds in the past 3 months.  -Congratulated patient and highly encouraged to continue his  healthier eating, and increased ways of activity     4. Strain of left rotator cuff capsule, subsequent encounter  Still with left shoulder pain even after his carpal tunnel surgery on the left that was done recently. -Patient was told by his orthopedist they could do injection as the next step. -Advised patient to avoid follow-up with Ortho until Covid-19 crisis is over if possible.     5.   Health education and counseling  - Novel Covid -19 counseling done; all questions were answered.   - Current CDC and federal guidelines reviewed with patient  - Reminded pt of extreme importance of social distancing; minimizing contacts with others, avoiding ALL but emergency appts etc. - Told patient to be prepared, not scared; and be smart for the sake of others - told to call with any additional concerns     I discussed the assessment and treatment plan with the patient. The patient was provided an opportunity to ask questions and all were answered.   - The patient agreed with the plan and demonstrated an understanding of the instructions.   No barriers to understanding were identified.  Red flag symptoms and signs discussed in detail.  Patient expressed understanding regarding what to do in case of emergency\urgent symptoms   Return in about 4 weeks (around 10/23/2018) for f/up 27mo- changed DM meds- FBS and 2hr PP log; may need BP med change.    Meds ordered this encounter  Medications  . liraglutide (VICTOZA) 18 MG/3ML SOPN    Sig: Inject 0.3 mLs (1.8 mg total) into the skin every morning. This was an inc in dose    Dispense:  27 mL    Refill:  1  . metFORMIN (GLUCOPHAGE) 500 MG tablet    Sig: 2 tabs in am and 3 tabs in evening- this was inc in dose    Dispense:  450 tablet    Refill:  1  . empagliflozin (JARDIANCE) 25 MG TABS tablet    Sig: Take 12.5 mg by mouth daily.  New med for pt   ( d/ced kombiglyze )     Dispense:  45 tablet    Refill:  0     Medications Discontinued  During This Encounter  Medication Reason  . KOMBIGLYZE XR 2.10-998 MG TB24   . liraglutide (VICTOZA) 18 MG/3ML SOPN        I provided 23+ minutes of non-face-to-face time during this encounter.    Mellody Dance, DO

## 2018-10-04 ENCOUNTER — Other Ambulatory Visit: Payer: Self-pay

## 2018-10-04 ENCOUNTER — Encounter: Payer: Self-pay | Admitting: Family Medicine

## 2018-10-04 ENCOUNTER — Ambulatory Visit (INDEPENDENT_AMBULATORY_CARE_PROVIDER_SITE_OTHER): Payer: BLUE CROSS/BLUE SHIELD | Admitting: Family Medicine

## 2018-10-04 ENCOUNTER — Telehealth: Payer: Self-pay

## 2018-10-04 VITALS — BP 136/94 | HR 59 | Temp 99.1°F | Ht 68.0 in | Wt 217.0 lb

## 2018-10-04 DIAGNOSIS — R52 Pain, unspecified: Secondary | ICD-10-CM | POA: Diagnosis not present

## 2018-10-04 DIAGNOSIS — R6889 Other general symptoms and signs: Secondary | ICD-10-CM | POA: Diagnosis not present

## 2018-10-04 DIAGNOSIS — R509 Fever, unspecified: Secondary | ICD-10-CM | POA: Diagnosis not present

## 2018-10-04 DIAGNOSIS — Z20822 Contact with and (suspected) exposure to covid-19: Secondary | ICD-10-CM

## 2018-10-04 NOTE — Telephone Encounter (Signed)
Spoke to patient's wife. Patient started shaking and had a fever last night.  The highest reading was 101.4 and patient took tylenol and temp came down.  Patient's temp this morning is 98.9 and had increased up to 99 before ending call with the patient's wife/  Patient last took tylenol at 1 am.  Patient is c/o bosyaches.  Denies and cough, SOB, congestion or any other related symptoms.  Patient given appointment for 215 via tele visit.  Advised the wife that if fever reaches 101 or higher or if he develops cough, SOB, n/v to please go to [facility] for care. Wife expressed understanding. MPulliam, CMA/RT(R)

## 2018-10-04 NOTE — Progress Notes (Signed)
Virtual Visit via Telephone/ Video Note for Russell Robles, D.O- Primary Care Physician at [facility]   I connected with current patient today by telephone/ video and verified that I am speaking with the correct person using two identifiers.    This visit type was conducted due to national recommendations for restrictions regarding the COVID-19 Pandemic (e.g. social distancing) in an effort to limit this patient's exposure and mitigate transmission in our community.  Due to her co-morbid illnesses, this patient is at least at moderate risk for complications without adequate follow up.  This format is felt to be most appropriate for this patient at this time.  The patient did not have access to video technology/had technical difficulties with video requiring transitioning to audio format only (telephone).  No physical exam could be performed with this format, beyond that communicated to [place] by the patient/ family members as noted.  Additionally my staff members also discussed with the patient that there may be a patient charge related to this service.   The patient expressed understanding, and agreed to proceed.      History of Present Illness:   Patient works for the [employer].  He is a maintenance type Insurance underwriter.  He has been wearing N 95 mask daily to work and gloves.  Patient complains of fevers with almost Reiger's that started with acute onset last night.  He also has body aches which come on with the fevers.  He denies any URI symptoms, denies cough, denies head congestion, denies headache, denies any shortness of breath or difficulty in breathing.  He also denies any nausea, vomiting or GI symptoms.  Patient has been eating and drinking normally.   T-max was 101.4 last evening when it hit him and when he woke up this morning without any antipyretics on board, he was 98.9.  Throughout the day today, patient's T-max has been 99.0 orally, and his last Tylenol was at 1 AM  hence was worn off by 7 or 8 this morning    Wt Readings from Last 3 Encounters:  10/04/18 217 lb (98.4 kg)  09/25/18 219 lb (99.3 kg)  08/02/18 230 lb 9.6 oz (104.6 kg)    BP Readings from Last 3 Encounters:  10/04/18 (!) 136/94  09/25/18 (!) 151/81  08/02/18 134/78    Pulse Readings from Last 3 Encounters:  10/04/18 (!) 59  08/02/18 72  06/26/18 64    BMI Readings from Last 3 Encounters:  10/04/18 32.99 kg/m  09/25/18 33.30 kg/m  08/02/18 35.06 kg/m      -Vitals obtained; Medications, allergies reconciled;  personal medical, social, Sx etc. etc. histories were updated by Lanier Prude the medical assistant today and are reflected in below chart   Patient Care Team    Relationship Specialty Notifications Start End  Russell Dance, DO PCP - General Family Medicine  03/01/17      Patient Active Problem List   Diagnosis Date Noted  . Type 2 diabetes mellitus without retinopathy ([place]) 02/01/2018    Priority: High  . Diabetes mellitus type 2 with complications ([place]) 97/41/6384    Priority: High  . Hypertension associated with diabetes ([place]) 06/18/2015    Priority: High  . Hyperlipidemia associated with type 2 diabetes mellitus ([place]) 06/18/2015    Priority: High  . Obesity, Class I, BMI 30-34.9 04/12/2017    Priority: Medium  . Inactivity 04/12/2017    Priority: Low  . Age-related nuclear cataract of both eyes  06/18/2015    Priority: Low  . Strain of rotator cuff capsule 04/17/2018  . Impingement syndrome of shoulder region, left 04/16/2018  . Bursitis of shoulder, left 04/16/2018  . Cortical age-related cataract of both eyes 02/01/2018  . Dermatochalasis of both upper eyelids 02/01/2018  . Nuclear sclerotic cataract of both eyes 02/01/2018  . new onset Murmur, heart 12/19/2017  . Acute pain of left shoulder 10/17/2017  . Presbyopia 06/18/2015  . Inguinal hernia unilateral, non-recurrent 12/13/2010     Current Meds  Medication Sig  . CINNAMON PO  Take 2 capsules by mouth daily.  . Cyanocobalamin (VITAMIN B12 PO) Take 1 tablet by mouth daily.  . empagliflozin (JARDIANCE) 25 MG TABS tablet Take 12.5 mg by mouth daily.  . hydrochlorothiazide (HYDRODIURIL) 12.5 MG tablet Take 1 tablet by mouth once daily  . liraglutide (VICTOZA) 18 MG/3ML SOPN Inject 0.3 mLs (1.8 mg total) into the skin every morning.  Marland Kitchen losartan (COZAAR) 100 MG tablet Take 1 tablet by mouth once daily  . meloxicam (MOBIC) 15 MG tablet Take by mouth 2 (two) times daily.   . metFORMIN (GLUCOPHAGE) 500 MG tablet 2 tabs in am and 3 tabs in evening  . metoprolol succinate (TOPROL-XL) 50 MG 24 hr tablet Take 1 tablet (50 mg total) by mouth every morning.  Marland Kitchen oxyCODONE (OXY IR/ROXICODONE) 5 MG immediate release tablet Take 1 tablet (5 mg total) by mouth every 4 (four) hours as needed.  . simvastatin (ZOCOR) 80 MG tablet Take 1 tablet (80 mg total) by mouth at bedtime. (Patient taking differently: Take 80 mg by mouth every morning. )     Allergies:  No Known Allergies   ROS:  See above HPI for pertinent positives and negatives   Objective:   Blood pressure (!) 136/94, pulse (!) 59, temperature 99.1 F (37.3 C), height 5\' 8"  (1.727 m), weight 217 lb (98.4 kg). (if some vitals are omitted, this means that patient was UNABLE to obtain them even though asked to get them prior to [place] today) General: Patient appeared in his usual state of health-in no acute distress.  Skin: Pt confirms warm and dry  extremities and pink fingertips Respiratory: speaking in full sentences, no conversational dyspnea Psych: A and O *3, appears insight good, mood- full      Impression and Recommendations:      ICD-10-CM   1. Fever and chills R50.9   2. Generalized body aches-with fevers R52   3. Suspected Covid-19 Virus Infection- patient with very possible exposure due to line of work. R68.89     -Gave patient strict instructions that if he has worsening fevers along with any respiratory  distress, he should proceed to [facility] hospital and/or, told them I am on call this week and so he is more than welcome to call the on-call nurse for further direction.  -Supportive care with Tylenol, fluids, etc. discussed and p.o. as tolerated  -Explained that patient must be in quarantine until fever free for 3 days and not on antipyretics.  Also cannot return to work until he is 7 days from the start of his symptoms.  Patient asked for a letter for work and he was okay to be out but must have a letter to return.  PT's Wife, who is also my patient, works in the homeless shelter with city [place] was told to stay home.  I did tell her I would provide her with note to return if needed.  - - Novel Covid -19  counseling done; all questions were answered.   - Current CDC and federal guidelines reviewed with patient  - Reminded pt of extreme importance of social distancing; minimizing contacts with others, avoiding ALL but emergency appts etc. - Told patient to be prepared, not scared; and be smart for the sake of others - told to call with any concerns  I discussed the assessment and treatment plan with the patient. The patient was provided an opportunity to ask questions and all were answered.   The patient agreed with the plan and demonstrated an understanding of the instructions.   No barriers to understanding were identified.  Red flag symptoms and signs discussed in detail.  Patient expressed understanding regarding what to do in case of emergency\urgent symptoms   The patient was advised to call back or seek an in-person evaluation if the symptoms worsen or if the condition fails to improve as anticipated.   Return if symptoms worsen or fail to improve and also, for F-up of current med issues as previously d/c pt.    **Gross side effects, risk and benefits, and alternatives of medications and treatment plan in general discussed with patient.  Patient is aware that all medications have  potential side effects and we are unable to predict every side effect or drug-drug interaction that may occur.   Patient was strongly encouraged to call with any questions or concerns they may have concerns.    I provided 12 minutes of non-face-to-face time during this encounter.   Russell Dance, DO

## 2018-10-08 ENCOUNTER — Other Ambulatory Visit: Payer: Self-pay

## 2018-10-08 ENCOUNTER — Ambulatory Visit (INDEPENDENT_AMBULATORY_CARE_PROVIDER_SITE_OTHER): Payer: BLUE CROSS/BLUE SHIELD | Admitting: Family Medicine

## 2018-10-08 ENCOUNTER — Encounter: Payer: Self-pay | Admitting: Family Medicine

## 2018-10-08 VITALS — BP 143/87 | HR 63 | Temp 94.5°F | Ht 68.0 in | Wt 215.0 lb

## 2018-10-08 DIAGNOSIS — R6889 Other general symptoms and signs: Secondary | ICD-10-CM

## 2018-10-08 DIAGNOSIS — Z20822 Contact with and (suspected) exposure to covid-19: Secondary | ICD-10-CM

## 2018-10-08 DIAGNOSIS — Z7189 Other specified counseling: Secondary | ICD-10-CM | POA: Diagnosis not present

## 2018-10-08 DIAGNOSIS — R509 Fever, unspecified: Secondary | ICD-10-CM

## 2018-10-08 NOTE — Progress Notes (Signed)
Virtual / live video office visit note for [employer], D.O- Primary Care Physician at [facility]   I connected with current patient today and beyond visually recognizing the correct individual, I verified that I am speaking with the correct person using two identifiers.  . Location of the patient: Home . Location of the provider: Office Only the patient (+/- their family members at pt's discretion- his wife) and myself were participating in the encounter    - This visit type was conducted due to national recommendations for restrictions regarding the COVID-19 Pandemic (e.g. social distancing) in an effort to limit this patient's exposure and mitigate transmission in our community.  This format is felt to be most appropriate for this patient at this time.   - The patient did have access to video technology today    - No physical exam could be performed with this format, beyond that communicated to [place] by the patient/ family members as noted.   - Additionally my office staff/ schedulers discussed with the patient that there may be a monetary charge related to this service, depending on patient's medical insurance.   The patient expressed understanding, and agreed to proceed.      History of Present Illness:  -Last seen patient for last office visit on 10/04/2018.  At that time he had bad fevers and terrible chills that were acute in onset.  His T-max was 101.4.  He denied any other symptoms but since he was high risk for possible covid-19 infection as he works for the [employer], he did prior have multiple potential contacts with high risk individuals.  Pt doing a lot better.  Temps Friday were running 99.1, 96.8, 97.7, 99.0.   Friday night one time it was 100.1.  Then Sat- running in the 98, 96 region and has been since.  Patient denies any GI symptoms, shortness of breath, productive cough etc.    Impression and Recommendations:    1. Suspected  Covid-19 Virus Infection   2. Fever and chills   3. Advice Given About Covid-19 Virus Infection    - Novel Covid -19 counseling done; all questions were answered.   - Current CDC and federal guidelines reviewed with patient  - Reminded pt of extreme importance of social distancing; minimizing contacts with others, avoiding ALL but emergency appts etc. -Reviewed return to work recommendations per CDC guidelines with patient as well as with his patient who was part of our visit today. -Explained to patient that current guidelines do not indicate that wife needs to stay out of work.  However, both of them need notes to return.  We will provide them. - told to call with any concerns; but otherwise since symptoms currently resolved, patient will call back to notify us if they return or new ones occur  - As part of my medical decision making, I reviewed the following data within the [place] History obtained from pt /family, CMA notes reviewed and incorporated if applicable, Labs reviewed, Radiograph/ tests reviewed if applicable and OV notes from prior OV's with me, as well as other specialists she/he has seen since seeing me last, were all reviewed and used in my medical decision making process today.   - Additionally, discussion had with patient regarding txmnt plan, their biases about that plan etc were used in my medical decision making today.   - The patient agreed with the plan and demonstrated an understanding of the  instructions.   No barriers to understanding were identified.   - Red flag symptoms and signs discussed in detail.  Patient expressed understanding regarding what to do in case of emergency\ urgent symptoms.  The patient was advised to call back or seek an in-person evaluation if the symptoms worsen or if the condition fails to improve as anticipated.   Return if symptoms worsen or fail to improve, for Return OV early July for diabetes/ MMP (will need a1c ).    I  provided 20 minutes of non-face-to-face time during this encounter,with over 50% of the time in direct counseling on patients medical conditions/ medical concerns.  Additional time was spent with charting and coordination of care after the actual visit commenced.   Note:  This note was prepared with assistance of Dragon voice recognition software. Occasional wrong-word or sound-a-like substitutions may have occurred due to the inherent limitations of voice recognition software.  Mellody Dance, DO     Patient Care Team    Relationship Specialty Notifications Start End  Mellody Dance, DO PCP - General Family Medicine  03/01/17     -Vitals obtained; medications/ allergies reconciled;  personal medical, social, Sx etc.histories were updated by CMA, reviewed by me and are reflected in chart  Patient Active Problem List   Diagnosis Date Noted  . Type 2 diabetes mellitus without retinopathy ([place]) 02/01/2018    Priority: High  . Diabetes mellitus type 2 with complications ([place]) 51/88/4166    Priority: High  . Hypertension associated with diabetes ([place]) 06/18/2015    Priority: High  . Hyperlipidemia associated with type 2 diabetes mellitus ([place]) 06/18/2015    Priority: High  . Obesity, Class I, BMI 30-34.9 04/12/2017    Priority: Medium  . Inactivity 04/12/2017    Priority: Low  . Age-related nuclear cataract of both eyes 06/18/2015    Priority: Low  . Strain of rotator cuff capsule 04/17/2018  . Impingement syndrome of shoulder region, left 04/16/2018  . Bursitis of shoulder, left 04/16/2018  . Cortical age-related cataract of both eyes 02/01/2018  . Dermatochalasis of both upper eyelids 02/01/2018  . Nuclear sclerotic cataract of both eyes 02/01/2018  . new onset Murmur, heart 12/19/2017  . Acute pain of left shoulder 10/17/2017  . Presbyopia 06/18/2015  . Inguinal hernia unilateral, non-recurrent 12/13/2010     Current Meds  Medication Sig  . CINNAMON PO Take 2 capsules  by mouth daily.  . Cyanocobalamin (VITAMIN B12 PO) Take 1 tablet by mouth daily.  . empagliflozin (JARDIANCE) 25 MG TABS tablet Take 12.5 mg by mouth daily.  . hydrochlorothiazide (HYDRODIURIL) 12.5 MG tablet Take 1 tablet by mouth once daily  . liraglutide (VICTOZA) 18 MG/3ML SOPN Inject 0.3 mLs (1.8 mg total) into the skin every morning.  Marland Kitchen losartan (COZAAR) 100 MG tablet Take 1 tablet by mouth once daily  . meloxicam (MOBIC) 15 MG tablet Take by mouth 2 (two) times daily.   . metFORMIN (GLUCOPHAGE) 500 MG tablet 2 tabs in am and 3 tabs in evening  . ondansetron (ZOFRAN) 4 MG tablet Take 1 tablet (4 mg total) by mouth every 8 (eight) hours as needed for nausea or vomiting.  Marland Kitchen oxyCODONE (OXY IR/ROXICODONE) 5 MG immediate release tablet Take 1 tablet (5 mg total) by mouth every 4 (four) hours as needed.  . [DISCONTINUED] metoprolol succinate (TOPROL-XL) 50 MG 24 hr tablet Take 1 tablet (50 mg total) by mouth every morning.  . [DISCONTINUED] simvastatin (ZOCOR) 80 MG tablet Take 1 tablet (  80 mg total) by mouth at bedtime. (Patient taking differently: Take 80 mg by mouth every morning. )     No Known Allergies   ROS:  See above HPI for pertinent positives and negatives   Objective:   Blood pressure (!) 143/87, pulse 63, temperature (!) 94.5 F (34.7 C), height 5\' 8"  (1.727 m), weight 215 lb (97.5 kg).  (if some vitals are omitted, this means that patient was UNABLE to obtain them even though they were asked to get them prior to OV today.  They were asked to call us at their earliest convenience with these once obtained.)  General: A & O * 3; visually in no acute distress; in usual state of health.  Skin: Visible skin appears normal and pt's usual skin color HEENT:  EOMI, head is normocephalic and atraumatic.  Sclera are anicteric. Neck has a good range of motion.  Lips are noncyanotic Chest: normal chest excursion and movement Respiratory: speaking in full sentences, no conversational  dyspnea; no use of accessory muscles Psych: insight good, mood- appears full

## 2018-10-09 ENCOUNTER — Telehealth: Payer: Self-pay | Admitting: Family Medicine

## 2018-10-09 ENCOUNTER — Other Ambulatory Visit: Payer: Self-pay | Admitting: Family Medicine

## 2018-10-09 DIAGNOSIS — E1159 Type 2 diabetes mellitus with other circulatory complications: Secondary | ICD-10-CM

## 2018-10-09 DIAGNOSIS — I10 Essential (primary) hypertension: Principal | ICD-10-CM

## 2018-10-09 DIAGNOSIS — I152 Hypertension secondary to endocrine disorders: Secondary | ICD-10-CM

## 2018-10-09 NOTE — Telephone Encounter (Signed)
Resent into the pharmacy. MPulliam, CMA/RT(R)

## 2018-10-09 NOTE — Telephone Encounter (Signed)
Forwarding message to medical assistant to contact [facility] - [place], [place] - [place] [phone] (Phone) [phone] (Fax)   --She has questions in regards to:  ( She says Ins denied stating Rx already written for & we need to possibly cancel if sent to a diff pharmacy??) metoprolol succinate (TOPROL-XL) 50 MG 24 hr tablet [[id number]]   Order Details  Dose: 50 mg Route: Oral Frequency: Every morning - 10a  Indications of Use: Hypertension  Dispense Quantity: 90 tablet Refills: 3 Fills remaining: --        Sig: Take 1 tablet (50 mg total) by mouth every morning.       Written Date: 09/12/17 Expiration Date: 09/12/18    Start Date: 09/12/17 End Date: --         Ordering Provider: -- DEA #:  -- NPI:  --   Authorizing Provider: Mellody Dance, DO DEA #:  [id number] NPI:  [id number]      --glh

## 2018-10-15 ENCOUNTER — Other Ambulatory Visit: Payer: Self-pay | Admitting: Family Medicine

## 2018-10-15 DIAGNOSIS — E1169 Type 2 diabetes mellitus with other specified complication: Secondary | ICD-10-CM

## 2018-10-15 DIAGNOSIS — E785 Hyperlipidemia, unspecified: Principal | ICD-10-CM

## 2018-11-07 DIAGNOSIS — Z872 Personal history of diseases of the skin and subcutaneous tissue: Secondary | ICD-10-CM | POA: Diagnosis not present

## 2018-11-07 DIAGNOSIS — L821 Other seborrheic keratosis: Secondary | ICD-10-CM | POA: Diagnosis not present

## 2018-11-27 ENCOUNTER — Other Ambulatory Visit: Payer: Self-pay

## 2018-11-27 DIAGNOSIS — I152 Hypertension secondary to endocrine disorders: Secondary | ICD-10-CM

## 2018-11-27 DIAGNOSIS — E1159 Type 2 diabetes mellitus with other circulatory complications: Secondary | ICD-10-CM

## 2018-11-27 MED ORDER — HYDROCHLOROTHIAZIDE 12.5 MG PO TABS: 12.5000 mg | ORAL_TABLET | Freq: Every day | ORAL | 0 refills | Status: DC

## 2018-11-27 MED ORDER — LOSARTAN POTASSIUM 100 MG PO TABS: 100.0000 mg | ORAL_TABLET | Freq: Every day | ORAL | 0 refills | Status: DC

## 2019-02-06 DIAGNOSIS — Z7984 Long term (current) use of oral hypoglycemic drugs: Secondary | ICD-10-CM | POA: Diagnosis not present

## 2019-02-06 DIAGNOSIS — H2513 Age-related nuclear cataract, bilateral: Secondary | ICD-10-CM | POA: Diagnosis not present

## 2019-02-06 DIAGNOSIS — E119 Type 2 diabetes mellitus without complications: Secondary | ICD-10-CM | POA: Diagnosis not present

## 2019-02-06 DIAGNOSIS — H25013 Cortical age-related cataract, bilateral: Secondary | ICD-10-CM | POA: Diagnosis not present

## 2019-02-06 LAB — HM DIABETES EYE EXAM

## 2019-02-13 ENCOUNTER — Telehealth: Payer: Self-pay

## 2019-02-13 NOTE — Telephone Encounter (Signed)
-----   Message from Mellody Dance, DO sent at 02/13/2019  5:36 PM EDT ----- Regarding: pt care issue Good day!   This patient has not seen me for his DM or other chronic conditions in a long, long while.  He is overdue for his blood work as well.  Please call him and tell him he needs to come in for fasting blood work and then 3 days later have an office visit with me. -- this can be via doxy or telemedicine if that is patient's preference.  Please let me know if you are not able to do so.  Thanks guys,  Dr Jenetta Downer

## 2019-02-13 NOTE — Telephone Encounter (Signed)
Please call pt to schedule appt per attached Dr. Raliegh Scarlet note.  Charyl Bigger, CMA

## 2019-02-18 ENCOUNTER — Other Ambulatory Visit: Payer: Self-pay | Admitting: Family Medicine

## 2019-02-18 DIAGNOSIS — I152 Hypertension secondary to endocrine disorders: Secondary | ICD-10-CM

## 2019-02-18 DIAGNOSIS — E1159 Type 2 diabetes mellitus with other circulatory complications: Secondary | ICD-10-CM

## 2019-03-07 ENCOUNTER — Other Ambulatory Visit: Payer: Self-pay

## 2019-03-07 ENCOUNTER — Other Ambulatory Visit: Payer: BC Managed Care – PPO

## 2019-03-07 DIAGNOSIS — Z0000 Encounter for general adult medical examination without abnormal findings: Secondary | ICD-10-CM

## 2019-03-07 DIAGNOSIS — E118 Type 2 diabetes mellitus with unspecified complications: Secondary | ICD-10-CM

## 2019-03-07 DIAGNOSIS — E1169 Type 2 diabetes mellitus with other specified complication: Secondary | ICD-10-CM

## 2019-03-07 DIAGNOSIS — E1159 Type 2 diabetes mellitus with other circulatory complications: Secondary | ICD-10-CM | POA: Diagnosis not present

## 2019-03-07 DIAGNOSIS — I152 Hypertension secondary to endocrine disorders: Secondary | ICD-10-CM

## 2019-03-07 DIAGNOSIS — E785 Hyperlipidemia, unspecified: Secondary | ICD-10-CM

## 2019-03-07 DIAGNOSIS — I10 Essential (primary) hypertension: Secondary | ICD-10-CM | POA: Diagnosis not present

## 2019-03-08 LAB — CBC WITH DIFFERENTIAL/PLATELET
Basophils Absolute: 0.1 10*3/uL (ref 0.0–0.2)
Basos: 1 %
EOS (ABSOLUTE): 0.8 10*3/uL — ABNORMAL HIGH (ref 0.0–0.4)
Eos: 9 %
Hematocrit: 42.2 % (ref 37.5–51.0)
Hemoglobin: 14.3 g/dL (ref 13.0–17.7)
Immature Grans (Abs): 0 10*3/uL (ref 0.0–0.1)
Immature Granulocytes: 0 %
Lymphocytes Absolute: 3.6 10*3/uL — ABNORMAL HIGH (ref 0.7–3.1)
Lymphs: 36 %
MCH: 28.7 pg (ref 26.6–33.0)
MCHC: 33.9 g/dL (ref 31.5–35.7)
MCV: 85 fL (ref 79–97)
Monocytes Absolute: 1 10*3/uL — ABNORMAL HIGH (ref 0.1–0.9)
Monocytes: 11 %
Neutrophils Absolute: 4.3 10*3/uL (ref 1.4–7.0)
Neutrophils: 43 %
Platelets: 279 10*3/uL (ref 150–450)
RBC: 4.99 x10E6/uL (ref 4.14–5.80)
RDW: 13 % (ref 11.6–15.4)
WBC: 9.8 10*3/uL (ref 3.4–10.8)

## 2019-03-08 LAB — T3: T3, Total: 132 ng/dL (ref 71–180)

## 2019-03-08 LAB — COMPREHENSIVE METABOLIC PANEL
ALT: 18 IU/L (ref 0–44)
AST: 18 IU/L (ref 0–40)
Albumin/Globulin Ratio: 1.8 (ref 1.2–2.2)
Albumin: 4.6 g/dL (ref 3.8–4.9)
Alkaline Phosphatase: 61 IU/L (ref 39–117)
BUN/Creatinine Ratio: 13 (ref 9–20)
BUN: 13 mg/dL (ref 6–24)
Bilirubin Total: 0.5 mg/dL (ref 0.0–1.2)
CO2: 20 mmol/L (ref 20–29)
Calcium: 9.6 mg/dL (ref 8.7–10.2)
Chloride: 102 mmol/L (ref 96–106)
Creatinine, Ser: 1.02 mg/dL (ref 0.76–1.27)
GFR calc Af Amer: 94 mL/min/{1.73_m2} (ref >59)
GFR calc non Af Amer: 81 mL/min/{1.73_m2} (ref >59)
Globulin, Total: 2.5 g/dL (ref 1.5–4.5)
Glucose: 149 mg/dL — ABNORMAL HIGH (ref 65–99)
Potassium: 3.9 mmol/L (ref 3.5–5.2)
Sodium: 137 mmol/L (ref 134–144)
Total Protein: 7.1 g/dL (ref 6.0–8.5)

## 2019-03-08 LAB — LIPID PANEL
Chol/HDL Ratio: 3.3 ratio (ref 0.0–5.0)
Cholesterol, Total: 107 mg/dL (ref 100–199)
HDL: 32 mg/dL — ABNORMAL LOW (ref >39)
LDL Chol Calc (NIH): 56 mg/dL (ref 0–99)
Triglycerides: 102 mg/dL (ref 0–149)
VLDL Cholesterol Cal: 19 mg/dL (ref 5–40)

## 2019-03-08 LAB — HEMOGLOBIN A1C
Est. average glucose Bld gHb Est-mCnc: 134 mg/dL
Hgb A1c MFr Bld: 6.3 % — ABNORMAL HIGH (ref 4.8–5.6)

## 2019-03-08 LAB — T4, FREE: Free T4: 1.06 ng/dL (ref 0.82–1.77)

## 2019-03-08 LAB — VITAMIN D 25 HYDROXY (VIT D DEFICIENCY, FRACTURES): Vit D, 25-Hydroxy: 35.8 ng/mL (ref 30.0–100.0)

## 2019-03-08 LAB — TSH: TSH: 1.05 u[IU]/mL (ref 0.450–4.500)

## 2019-03-13 ENCOUNTER — Ambulatory Visit (INDEPENDENT_AMBULATORY_CARE_PROVIDER_SITE_OTHER): Payer: BC Managed Care – PPO | Admitting: Family Medicine

## 2019-03-13 ENCOUNTER — Encounter: Payer: Self-pay | Admitting: Family Medicine

## 2019-03-13 ENCOUNTER — Other Ambulatory Visit: Payer: Self-pay

## 2019-03-13 DIAGNOSIS — I10 Essential (primary) hypertension: Secondary | ICD-10-CM | POA: Diagnosis not present

## 2019-03-13 DIAGNOSIS — E1159 Type 2 diabetes mellitus with other circulatory complications: Secondary | ICD-10-CM | POA: Diagnosis not present

## 2019-03-13 DIAGNOSIS — E669 Obesity, unspecified: Secondary | ICD-10-CM

## 2019-03-13 DIAGNOSIS — E1169 Type 2 diabetes mellitus with other specified complication: Secondary | ICD-10-CM

## 2019-03-13 DIAGNOSIS — E785 Hyperlipidemia, unspecified: Secondary | ICD-10-CM | POA: Diagnosis not present

## 2019-03-13 DIAGNOSIS — I152 Hypertension secondary to endocrine disorders: Secondary | ICD-10-CM

## 2019-03-13 DIAGNOSIS — E559 Vitamin D deficiency, unspecified: Secondary | ICD-10-CM

## 2019-03-13 DIAGNOSIS — E118 Type 2 diabetes mellitus with unspecified complications: Secondary | ICD-10-CM

## 2019-03-13 DIAGNOSIS — Z723 Lack of physical exercise: Secondary | ICD-10-CM

## 2019-03-13 MED ORDER — AMLODIPINE-VALSARTAN-HCTZ 5-160-12.5 MG PO TABS: 1.0000 | ORAL_TABLET | Freq: Every day | ORAL | 1 refills | Status: DC

## 2019-03-13 MED ORDER — VITAMIN D3 125 MCG (5000 UT) PO TABS: ORAL_TABLET | ORAL | 3 refills | Status: AC

## 2019-03-13 NOTE — Progress Notes (Signed)
Virtual / live video office visit note for [employer], D.O- Primary Care Physician at [facility]   I connected with current patient today and beyond visually recognizing the correct individual, I verified that I am speaking with the correct person using two identifiers.  . Location of the patient: Home . Location of the provider: Office Only the patient (+/- their family members at pt's discretion) and myself were participating in the encounter    - This visit type was conducted due to national recommendations for restrictions regarding the COVID-19 Pandemic (e.g. social distancing) in an effort to limit this patient's exposure and mitigate transmission in our community.  This format is felt to be most appropriate for this patient at this time.   - The patient did have access to video technology today    - No physical exam could be performed with this format, beyond that communicated to [place] by the patient/ family members as noted.   - Additionally my office staff/ schedulers discussed with the patient that there may be a monetary charge related to this service, depending on patient's medical insurance.   The patient expressed understanding, and agreed to proceed.      History of Present Illness:  Patient is currently at work; Estate manager/land agent of fact, I just finished up a job."  Patient notes not currently taking Vitamin D.  DM HPI:  -  He has been working on diet and exercise for diabetes. Has cut out Dr. Samson Frederic and drinking Dr. Dawayne Cirri.   Drinking more water.  Got the fitbit on and monitoring his steps. Averaging 13-15,000 steps per day.  Medication compliance - continues as established.  Worries that the metformin affects sperm count.  Says "since I've been on that, I don't know what the reason is ... it's not affecting my sex drive, but when I produce [ejaculate], it's nowhere near the quantity before I was taking that."  Home fasting glucose readings: says "up a  little bit," staying moderate in the 120's maybe;  2 hr PP: not checking.   Says "staying about the same reading all across."   Denies polyuria/polydipsia.  Denies hypo/ hyperglycemia symptoms  Last diabetic eye exam was No results found for: HMDIABEYEEXA  Last A1C in the office was:  Lab Results  Component Value Date   HGBA1C 6.3 (H) 03/07/2019   HGBA1C 6.9 (A) 06/26/2018   HGBA1C 6.5 (H) 03/19/2018    Lab Results  Component Value Date   MICROALBUR 30 06/26/2018   LDLCALC 72 03/19/2018   CREATININE 1.02 03/07/2019    Wt Readings from Last 3 Encounters:  10/08/18 215 lb (97.5 kg)  10/04/18 217 lb (98.4 kg)  09/25/18 219 lb (99.3 kg)    BP Readings from Last 3 Encounters:  10/08/18 (!) 143/87  10/04/18 (!) 136/94  09/25/18 (!) 151/81    1. HTN HPI:  -  His blood pressure has not been optimally controlled at home.  Pt is checking it at home.   140/80, 140/90.  Says "if the bottom number is down, I know that the top number is up a little; if the top number is down, then the bottom number is up in the nineties."  - Patient reports good compliance with blood pressure medications  - Denies medication S-E   - Smoking Status noted   - He denies new onset of: chest pain, exercise intolerance, shortness of breath, dizziness, visual changes, headache, lower extremity swelling or claudication.  Last 3 blood pressure readings in our office are as follows: BP Readings from Last 3 Encounters:  10/08/18 (!) 143/87  10/04/18 (!) 136/94  09/25/18 (!) 151/81    There were no vitals filed for this visit.    Depression screen [facility] 2/9 09/25/2018 06/26/2018 04/16/2018 03/19/2018 12/19/2017  Decreased Interest 0 0 0 0 0  Down, Depressed, Hopeless 0 0 0 0 0  PHQ - 2 Score 0 0 0 0 0  Altered sleeping 0 0 0 0 0  Tired, decreased energy 0 0 0 0 0  Change in appetite 0 0 0 0 0  Feeling bad or failure about yourself  0 0 0 0 0  Trouble concentrating 0 0 0 0 0  Moving slowly or  fidgety/restless 0 0 0 0 0  Suicidal thoughts 0 0 0 0 0  PHQ-9 Score 0 0 0 0 0  Difficult doing work/chores Not difficult at all Not difficult at all Not difficult at all Not difficult at all Not difficult at all  Some recent data might be hidden    No flowsheet data found.   Recent Results (from the past 2160 hour(s))  T3     Status: None   Collection Time: 03/07/19  8:29 AM  Result Value Ref Range   T3, Total 132 71 - 180 ng/dL  T4, free     Status: None   Collection Time: 03/07/19  8:29 AM  Result Value Ref Range   Free T4 1.06 0.82 - 1.77 ng/dL  TSH     Status: None   Collection Time: 03/07/19  8:29 AM  Result Value Ref Range   TSH 1.050 0.450 - 4.500 uIU/mL  VITAMIN D 25 Hydroxy (Vit-D Deficiency, Fractures)     Status: None   Collection Time: 03/07/19  8:29 AM  Result Value Ref Range   Vit D, 25-Hydroxy 35.8 30.0 - 100.0 ng/mL    Comment: Vitamin D deficiency has been defined by the [place] and an Endocrine Society practice guideline as a level of serum 25-OH vitamin D less than 20 ng/mL (1,2). The Endocrine Society went on to further define vitamin D insufficiency as a level between 21 and 29 ng/mL (2). 1. IOM (Institute of Medicine). 2010. Dietary reference    intakes for calcium and D. [place]: The    [employer]. 2. Holick MF, [place], Bischoff-Ferrari HA, et al.    Evaluation, treatment, and prevention of vitamin D    deficiency: an Endocrine Society clinical practice    guideline. JCEM. 2011 Jul; 96(7):1911-30.   Lipid panel     Status: Abnormal   Collection Time: 03/07/19  8:29 AM  Result Value Ref Range   Cholesterol, Total 107 100 - 199 mg/dL   Triglycerides 102 0 - 149 mg/dL   HDL 32 (L) >39 mg/dL   VLDL Cholesterol Cal 19 5 - 40 mg/dL   LDL Chol Calc (NIH) 56 0 - 99 mg/dL   Chol/HDL Ratio 3.3 0.0 - 5.0 ratio    Comment:                                   T. Chol/HDL Ratio  Men  Women                               1/2 Avg.Risk  3.4    3.3                                   Avg.Risk  5.0    4.4                                2X Avg.Risk  9.6    7.1                                3X Avg.Risk 23.4   11.0   Hemoglobin A1c     Status: Abnormal   Collection Time: 03/07/19  8:29 AM  Result Value Ref Range   Hgb A1c MFr Bld 6.3 (H) 4.8 - 5.6 %    Comment:          Prediabetes: 5.7 - 6.4          Diabetes: >6.4          Glycemic control for adults with diabetes: <7.0    Est. average glucose Bld gHb Est-mCnc 134 mg/dL  Comprehensive metabolic panel     Status: Abnormal   Collection Time: 03/07/19  8:29 AM  Result Value Ref Range   Glucose 149 (H) 65 - 99 mg/dL   BUN 13 6 - 24 mg/dL   Creatinine, Ser 1.02 0.76 - 1.27 mg/dL   GFR calc non Af Amer 81 >59 mL/min/1.73   GFR calc Af Amer 94 >59 mL/min/1.73   BUN/Creatinine Ratio 13 9 - 20   Sodium 137 134 - 144 mmol/L   Potassium 3.9 3.5 - 5.2 mmol/L   Chloride 102 96 - 106 mmol/L   CO2 20 20 - 29 mmol/L   Calcium 9.6 8.7 - 10.2 mg/dL   Total Protein 7.1 6.0 - 8.5 g/dL   Albumin 4.6 3.8 - 4.9 g/dL   Globulin, Total 2.5 1.5 - 4.5 g/dL   Albumin/Globulin Ratio 1.8 1.2 - 2.2   Bilirubin Total 0.5 0.0 - 1.2 mg/dL   Alkaline Phosphatase 61 39 - 117 IU/L   AST 18 0 - 40 IU/L   ALT 18 0 - 44 IU/L  CBC with Differential/Platelet     Status: Abnormal   Collection Time: 03/07/19  8:29 AM  Result Value Ref Range   WBC 9.8 3.4 - 10.8 x10E3/uL   RBC 4.99 4.14 - 5.80 x10E6/uL   Hemoglobin 14.3 13.0 - 17.7 g/dL   Hematocrit 42.2 37.5 - 51.0 %   MCV 85 79 - 97 fL   MCH 28.7 26.6 - 33.0 pg   MCHC 33.9 31.5 - 35.7 g/dL   RDW 13.0 11.6 - 15.4 %   Platelets 279 150 - 450 x10E3/uL   Neutrophils 43 Not Estab. %   Lymphs 36 Not Estab. %   Monocytes 11 Not Estab. %   Eos 9 Not Estab. %   Basos 1 Not Estab. %   Neutrophils Absolute 4.3 1.4 - 7.0 x10E3/uL   Lymphocytes Absolute 3.6 (H) 0.7 - 3.1 x10E3/uL   Monocytes  Absolute 1.0 (H) 0.1 - 0.9 x10E3/uL   EOS (ABSOLUTE) 0.8 (H) 0.0 -  0.4 x10E3/uL   Basophils Absolute 0.1 0.0 - 0.2 x10E3/uL   Immature Granulocytes 0 Not Estab. %   Immature Grans (Abs) 0.0 0.0 - 0.1 x10E3/uL     Impression and Recommendations:    1. Diabetes mellitus type 2 with complications ([place])   2. Hypertension associated with diabetes ([place])   3. Hyperlipidemia associated with type 2 diabetes mellitus ([name])   4. Obesity, Class I, BMI 30-34.9   5. Inactivity   6. Vitamin D deficiency     - Reviewed recent lab work (03/07/2019) in depth with patient today.  All lab work within normal limits unless otherwise noted.  Extensive education provided and all questions answered.  Diabetes Mellitus - A1c was 6.3 last check, down from 6.9 prior. - Blood sugars well controlled at this time. - Continue treatment plan as established.  See med list. - Pt tolerating treatment plan well with no S-E.  - Counseled patient on pathophysiology of disease and discussed various treatment options, which often includes dietary and lifestyle modifications as first line.  Importance of low carb diet discussed with patient in addition to regular exercise.   - Check FBS and 2 hours after the biggest meal of your day.  Keep log and bring in next OV for my review.   Also, if you ever feel poorly, please check your blood pressure and blood sugar, as one or the other could be the cause of your symptoms.  - Being a diabetic, you need yearly eye and foot exams. Make appt.for diabetic eye exam.  - Will continue to monitor.  Hypertension associated with DM - BP not optimally controlled at this time; slightly elevated. - Recommending beginning combination pill today. - Patient agrees to discontinue individual losartan and individual HCTZ. - Begin combination pill prescribed today.  See med list. - Education provided today.  All questions answered.  - Lifestyle changes such as dash diet and engaging in a  regular exercise program discussed with patient.  - Ambulatory BP monitoring encouraged. Keep log and bring in next OV. - Patient agrees to keep clinic updated about changes in blood pressure.  - Will continue to monitor.  Hyperlipidemia associated with DM - HDL = 32 last check, down from 38 prior. - LDL = 56 last check, at goal under 70. - Triglycerides = 102, down from 198 last check.  - Continue statin as prescribed.  See med list. - Pt tolerating meds well without S-E.  - To help increase HDL value, encouraged pt to engage in more regular exercise. - Ongoing prudent dietary changes such as low saturated & trans fat and low carb diets discussed with patient.  Encouraged weight loss when appropriate.   - Will continue to monitor and re-check as recommended.  Vitamin D Deficiency - Up to 35.8 from 26.6 last check. - Discussed goal range of 40-60. - Begin 5000 IU's daily OTC.  See med list. - Will continue to monitor.  Vitamin B12 - Per pt, taking B12 daily, "just one tablet a day." - Will continue to monitor.  Health Counseling & Preventative Health Maintenance - Advised patient to continue working toward exercising to improve overall mental, physical, and emotional health.    - Sexual health discussed with patient today.  Education provided and questions answered. - Advised patient that ejaculation volume may go down with aging and dehydration.  - Reviewed the "spokes of the wheel" of mood and health management.  Stressed the importance of ongoing prudent habits, including regular exercise,  appropriate sleep hygiene, healthful dietary habits, and prayer/meditation to relax.  - Encouraged patient to engage in daily physical activity, especially a formal exercise routine.  Recommended that the patient eventually strive for at least 150 minutes of moderate cardiovascular activity per week according to guidelines established by the [facility].   - Healthy dietary habits encouraged,  including low-carb, and high amounts of lean protein in diet.   - Patient should also consume adequate amounts of water.  - Health counseling performed.  All questions answered.  Recommendations - Follow up in near future with change to BP management. - If blood pressures are not at goal, patient understands need for f/up for med changes. - Return in 6 weeks, unless BP running perfectly, then patient can e-mail BP log via Mychart. - Otherwise, continue to follow-up 3-4 months for chronic health maintenance and wellness visits as scheduled.  - As part of my medical decision making, I reviewed the following data within the [place] History obtained from pt /family, CMA notes reviewed and incorporated if applicable, Labs reviewed, Radiograph/ tests reviewed if applicable and OV notes from prior OV's with me, as well as other specialists she/he has seen since seeing me last, were all reviewed and used in my medical decision making process today.   - Additionally, discussion had with patient regarding txmnt plan, their biases about that plan etc were used in my medical decision making today.   - The patient agreed with the plan and demonstrated an understanding of the instructions.   No barriers to understanding were identified.   - Red flag symptoms and signs discussed in detail.  Patient expressed understanding regarding what to do in case of emergency\ urgent symptoms.  The patient was advised to call back or seek an in-person evaluation if the symptoms worsen or if the condition fails to improve as anticipated.   Return in about 4 months (around 07/13/2019), or if symptoms worsen or fail to improve, for 6wks f/up for new BP meds and check in(or MyChart message if perfect); 83mo-A1c etc.    No orders of the defined types were placed in this encounter.   Meds ordered this encounter  Medications  . amLODIPine-Valsartan-HCTZ 5-160-12.5 MG TABS    Sig: Take 1 tablet by mouth  daily.    Dispense:  90 tablet    Refill:  1  . Cholecalciferol (VITAMIN D3) 125 MCG (5000 UT) TABS    Sig: 5,000 IU OTC vitamin D3 daily.    Dispense:  90 tablet    Refill:  3    Medications Discontinued During This Encounter  Medication Reason  . meloxicam (MOBIC) 15 MG tablet No longer needed (for PRN medications)  . ondansetron (ZOFRAN) 4 MG tablet No longer needed (for PRN medications)  . oxyCODONE (OXY IR/ROXICODONE) 5 MG immediate release tablet No longer needed (for PRN medications)  . hydrochlorothiazide (HYDRODIURIL) 12.5 MG tablet   . losartan (COZAAR) 100 MG tablet       Note:  This note was prepared with assistance of Dragon voice recognition software. Occasional wrong-word or sound-a-like substitutions may have occurred due to the inherent limitations of voice recognition software.  This document serves as a record of services personally performed by Mellody Dance, DO. It was created on her behalf by [name], a trained medical scribe. The creation of this record is based on the scribe's personal observations and the provider's statements to them.   I have reviewed the above medical documentation for accuracy  and completeness and I concur.  Mellody Dance, DO 03/18/2019 8:48 AM        Patient Care Team    Relationship Specialty Notifications Start End  Mellody Dance, DO PCP - General Family Medicine  03/01/17     -Vitals obtained; medications/ allergies reconciled;  personal medical, social, Sx etc.histories were updated by CMA, reviewed by me and are reflected in chart  Patient Active Problem List   Diagnosis Date Noted  . Type 2 diabetes mellitus without retinopathy ([place]) 02/01/2018    Priority: High  . Diabetes mellitus type 2 with complications ([place]) [id number]    Priority: High  . Hypertension associated with diabetes ([place]) 06/18/2015    Priority: High  . Hyperlipidemia associated with type 2 diabetes mellitus ([place]) 06/18/2015     Priority: High  . Obesity, Class I, BMI 30-34.9 04/12/2017    Priority: Medium  . Inactivity 04/12/2017    Priority: Low  . Age-related nuclear cataract of both eyes 06/18/2015    Priority: Low  . Strain of rotator cuff capsule 04/17/2018  . Impingement syndrome of shoulder region, left 04/16/2018  . Bursitis of shoulder, left 04/16/2018  . Cortical age-related cataract of both eyes 02/01/2018  . Dermatochalasis of both upper eyelids 02/01/2018  . Nuclear sclerotic cataract of both eyes 02/01/2018  . new onset Murmur, heart 12/19/2017  . Acute pain of left shoulder 10/17/2017  . Presbyopia 06/18/2015  . Inguinal hernia unilateral, non-recurrent 12/13/2010     Current Meds  Medication Sig  . CINNAMON PO Take 2 capsules by mouth daily.  . Cyanocobalamin (VITAMIN B12 PO) Take 1 tablet by mouth daily.  . metFORMIN (GLUCOPHAGE) 500 MG tablet 2 tabs in am and 3 tabs in evening  . metoprolol succinate (TOPROL-XL) 50 MG 24 hr tablet TAKE 1 TABLET BY MOUTH EVERY MORNING.  . simvastatin (ZOCOR) 80 MG tablet TAKE 1 TABLET BY MOUTH AT BEDTIME.  . [DISCONTINUED] hydrochlorothiazide (HYDRODIURIL) 12.5 MG tablet Take 1 tablet (12.5 mg total) by mouth daily. Needs ov 4 Refill  . [DISCONTINUED] losartan (COZAAR) 100 MG tablet Take 1 tablet (100 mg total) by mouth daily. Needs ov 4 Refill     No Known Allergies   ROS:  See above HPI for pertinent positives and negatives   Objective:   There were no vitals taken for this visit.  (if some vitals are omitted, this means that patient was UNABLE to obtain them even though they were asked to get them prior to OV today.  They were asked to call us at their earliest convenience with these once obtained.)  General: A & O * 3; visually in no acute distress; in usual state of health.  Skin: Visible skin appears normal and pt's usual skin color HEENT:  EOMI, head is normocephalic and atraumatic.  Sclera are anicteric. Neck has a good range of motion.   Lips are noncyanotic Chest: normal chest excursion and movement Respiratory: speaking in full sentences, no conversational dyspnea; no use of accessory muscles Psych: insight good, mood- appears full

## 2019-03-30 ENCOUNTER — Other Ambulatory Visit: Payer: Self-pay | Admitting: Family Medicine

## 2019-03-30 DIAGNOSIS — E118 Type 2 diabetes mellitus with unspecified complications: Secondary | ICD-10-CM

## 2019-04-16 ENCOUNTER — Other Ambulatory Visit: Payer: Self-pay | Admitting: Family Medicine

## 2019-04-16 DIAGNOSIS — I152 Hypertension secondary to endocrine disorders: Secondary | ICD-10-CM

## 2019-04-16 DIAGNOSIS — E1159 Type 2 diabetes mellitus with other circulatory complications: Secondary | ICD-10-CM

## 2019-04-16 DIAGNOSIS — E1169 Type 2 diabetes mellitus with other specified complication: Secondary | ICD-10-CM

## 2019-04-16 DIAGNOSIS — E785 Hyperlipidemia, unspecified: Secondary | ICD-10-CM

## 2019-04-20 ENCOUNTER — Other Ambulatory Visit: Payer: Self-pay | Admitting: Family Medicine

## 2019-04-20 DIAGNOSIS — E118 Type 2 diabetes mellitus with unspecified complications: Secondary | ICD-10-CM

## 2019-06-07 ENCOUNTER — Other Ambulatory Visit: Payer: Self-pay | Admitting: Family Medicine

## 2019-06-07 DIAGNOSIS — E118 Type 2 diabetes mellitus with unspecified complications: Secondary | ICD-10-CM

## 2019-06-16 IMAGING — DX DG SHOULDER 2+V*L*
3 series · 3 of 3 positions shown · non-contrast
Comparison: None.

CLINICAL DATA: Acute left shoulder pain, symptoms for 1 week

EXAM:
LEFT SHOULDER - 2+ VIEW

[shoulder internal rotation ap]
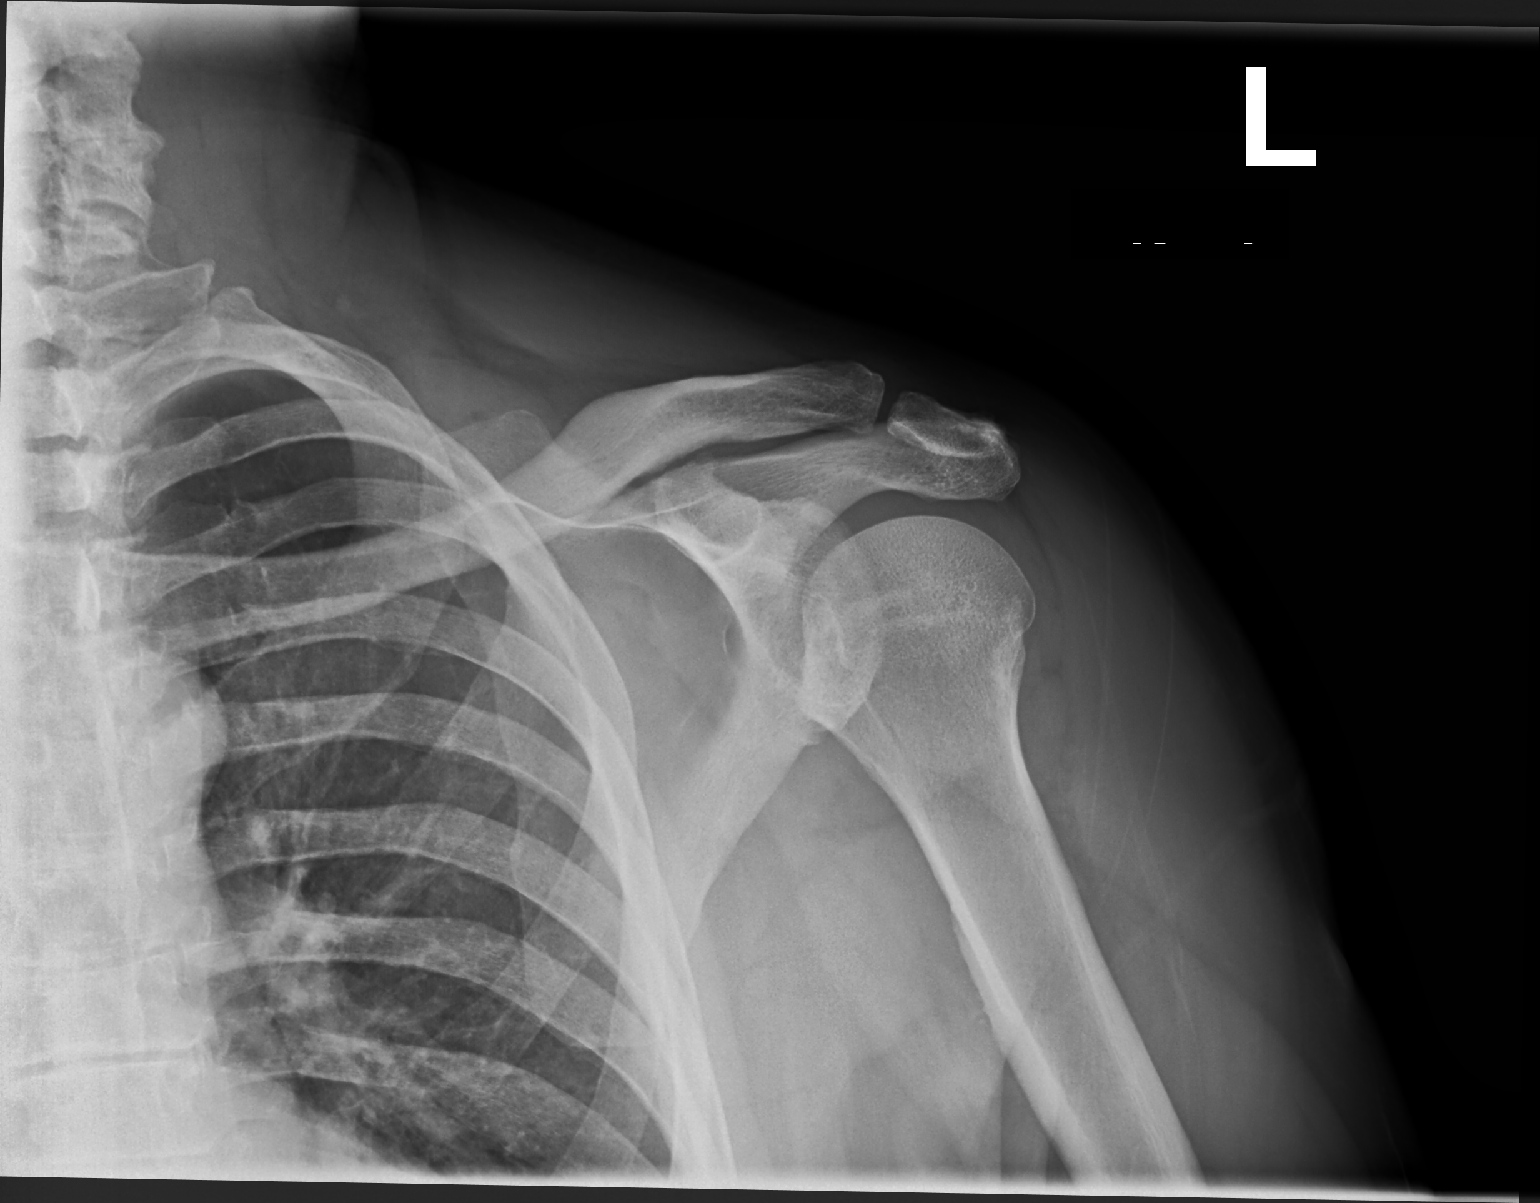

[shoulder external rotation ap]
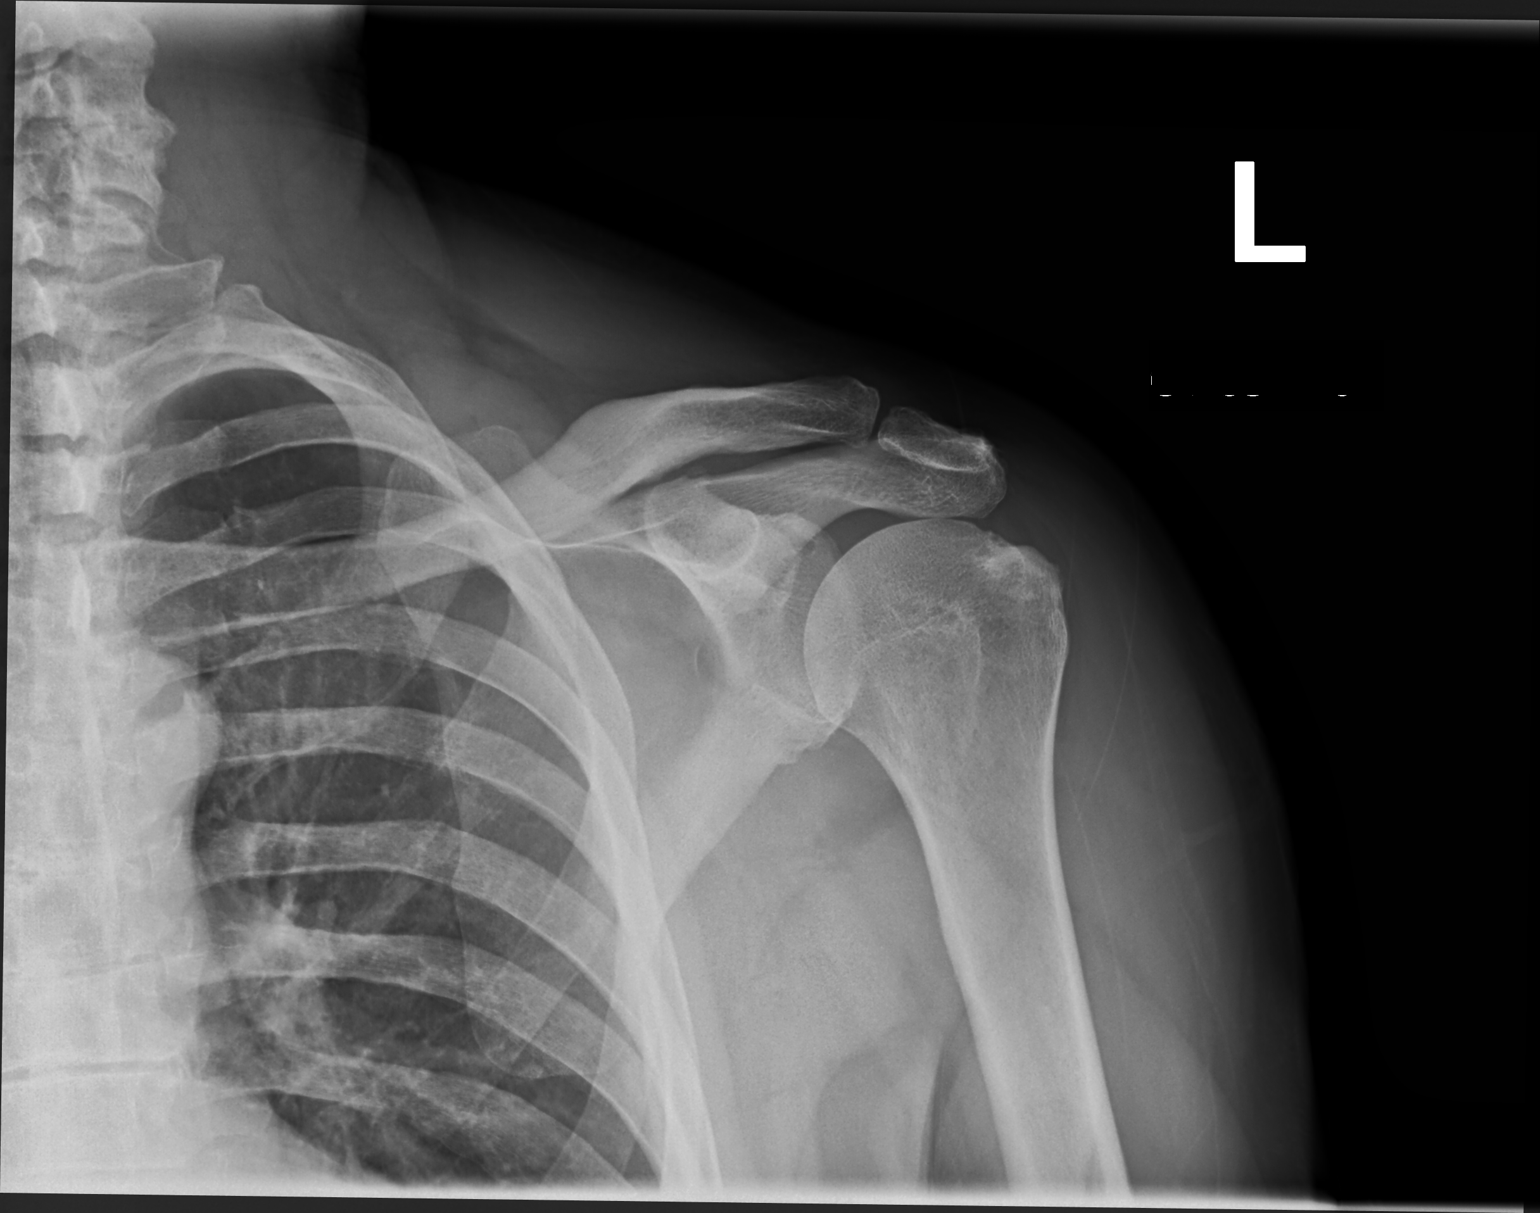

[shoulder ap]
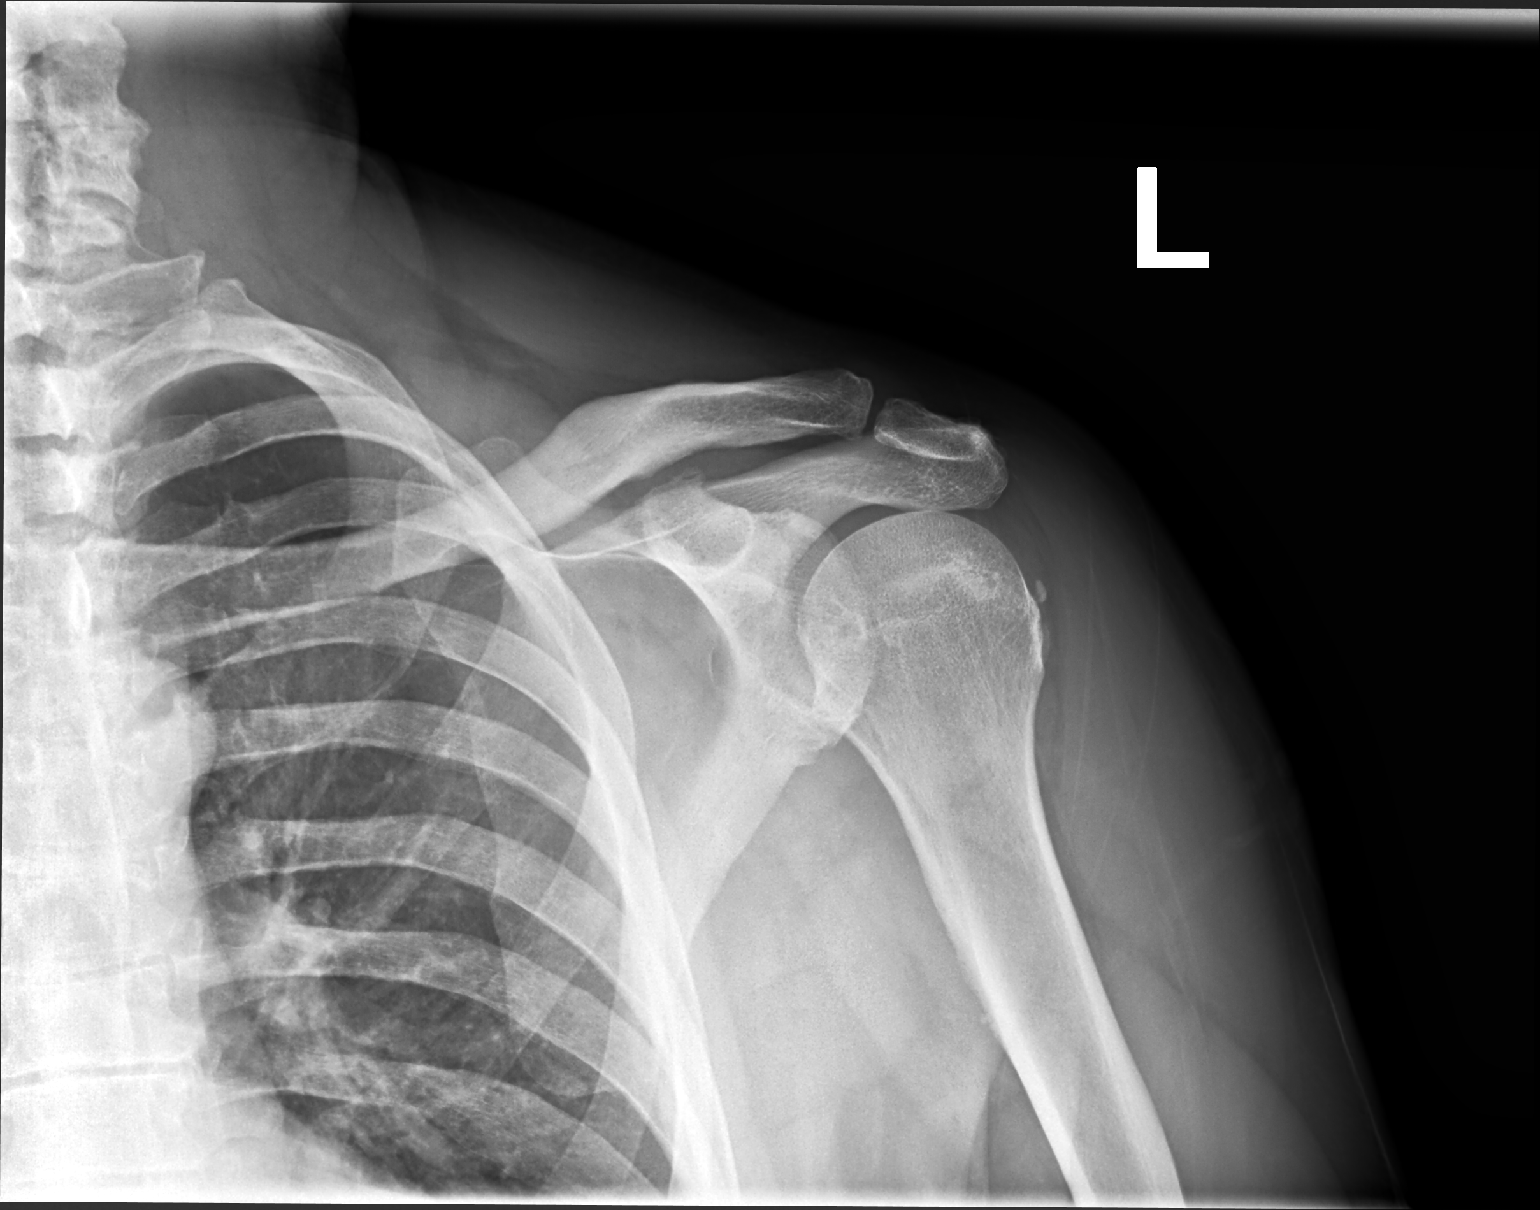

[3 of 3 positions shown; findings below may reference images not displayed]

FINDINGS: Small calcification along the rotator cuff. No fracture,
degenerative spurring, or malalignment.
IMPRESSION: 1. Calcific tendinitis of the rotator cuff.
2. No acute osseous finding.

## 2019-07-08 ENCOUNTER — Other Ambulatory Visit: Payer: Self-pay | Admitting: Family Medicine

## 2019-07-08 DIAGNOSIS — I152 Hypertension secondary to endocrine disorders: Secondary | ICD-10-CM

## 2019-07-08 DIAGNOSIS — E1159 Type 2 diabetes mellitus with other circulatory complications: Secondary | ICD-10-CM

## 2019-07-08 DIAGNOSIS — E118 Type 2 diabetes mellitus with unspecified complications: Secondary | ICD-10-CM

## 2019-07-22 ENCOUNTER — Other Ambulatory Visit: Payer: Self-pay | Admitting: Family Medicine

## 2019-07-22 DIAGNOSIS — E785 Hyperlipidemia, unspecified: Secondary | ICD-10-CM

## 2019-07-22 DIAGNOSIS — E1169 Type 2 diabetes mellitus with other specified complication: Secondary | ICD-10-CM

## 2019-08-12 ENCOUNTER — Other Ambulatory Visit: Payer: Self-pay | Admitting: Family Medicine

## 2019-08-12 DIAGNOSIS — E118 Type 2 diabetes mellitus with unspecified complications: Secondary | ICD-10-CM

## 2019-08-12 DIAGNOSIS — I152 Hypertension secondary to endocrine disorders: Secondary | ICD-10-CM

## 2019-08-12 DIAGNOSIS — E1159 Type 2 diabetes mellitus with other circulatory complications: Secondary | ICD-10-CM

## 2019-08-27 ENCOUNTER — Other Ambulatory Visit: Payer: Self-pay | Admitting: Family Medicine

## 2019-08-27 DIAGNOSIS — E118 Type 2 diabetes mellitus with unspecified complications: Secondary | ICD-10-CM

## 2019-08-27 DIAGNOSIS — E1159 Type 2 diabetes mellitus with other circulatory complications: Secondary | ICD-10-CM

## 2019-08-27 DIAGNOSIS — I152 Hypertension secondary to endocrine disorders: Secondary | ICD-10-CM

## 2019-08-27 DIAGNOSIS — E1169 Type 2 diabetes mellitus with other specified complication: Secondary | ICD-10-CM

## 2019-09-11 ENCOUNTER — Ambulatory Visit (INDEPENDENT_AMBULATORY_CARE_PROVIDER_SITE_OTHER): Payer: BC Managed Care – PPO | Admitting: Family Medicine

## 2019-09-11 ENCOUNTER — Encounter: Payer: Self-pay | Admitting: Family Medicine

## 2019-09-11 ENCOUNTER — Other Ambulatory Visit: Payer: Self-pay

## 2019-09-11 ENCOUNTER — Other Ambulatory Visit: Payer: BC Managed Care – PPO

## 2019-09-11 VITALS — BP 130/87 | Temp 96.9°F | Ht 68.0 in | Wt 216.0 lb

## 2019-09-11 DIAGNOSIS — E1169 Type 2 diabetes mellitus with other specified complication: Secondary | ICD-10-CM | POA: Diagnosis not present

## 2019-09-11 DIAGNOSIS — E785 Hyperlipidemia, unspecified: Secondary | ICD-10-CM | POA: Diagnosis not present

## 2019-09-11 DIAGNOSIS — E1159 Type 2 diabetes mellitus with other circulatory complications: Secondary | ICD-10-CM | POA: Diagnosis not present

## 2019-09-11 DIAGNOSIS — I152 Hypertension secondary to endocrine disorders: Secondary | ICD-10-CM

## 2019-09-11 DIAGNOSIS — I10 Essential (primary) hypertension: Secondary | ICD-10-CM | POA: Diagnosis not present

## 2019-09-11 DIAGNOSIS — E118 Type 2 diabetes mellitus with unspecified complications: Secondary | ICD-10-CM | POA: Diagnosis not present

## 2019-09-11 LAB — POCT GLYCOSYLATED HEMOGLOBIN (HGB A1C): Hemoglobin A1C: 7.3 % — AB (ref 4.0–5.6)

## 2019-09-11 MED ORDER — METFORMIN HCL 500 MG PO TABS: ORAL_TABLET | ORAL | 0 refills | Status: DC

## 2019-09-11 MED ORDER — VICTOZA 18 MG/3ML ~~LOC~~ SOPN: 1.8000 mg | PEN_INJECTOR | Freq: Every day | SUBCUTANEOUS | 0 refills | Status: DC

## 2019-09-11 MED ORDER — METOPROLOL SUCCINATE ER 50 MG PO TB24: ORAL_TABLET | ORAL | 0 refills | Status: DC

## 2019-09-11 MED ORDER — SIMVASTATIN 80 MG PO TABS: 80.0000 mg | ORAL_TABLET | Freq: Every day | ORAL | 0 refills | Status: DC

## 2019-09-11 MED ORDER — EMPAGLIFLOZIN 25 MG PO TABS: ORAL_TABLET | ORAL | 0 refills | Status: DC

## 2019-09-11 MED ORDER — AMLODIPINE-VALSARTAN-HCTZ 5-160-12.5 MG PO TABS: 1.0000 | ORAL_TABLET | Freq: Every day | ORAL | 0 refills | Status: DC

## 2019-09-11 NOTE — Progress Notes (Signed)
Telehealth office visit note for Russell Robles, D.O- at Primary Care at [facility]   I connected with current patient today and verified that I am speaking with the correct person   . Location of the patient: Home . Location of the provider: Office - This visit type was conducted due to national recommendations for restrictions regarding the COVID-19 Pandemic (e.g. social distancing) in an effort to limit this patient's exposure and mitigate transmission in our community.    - No physical exam could be performed with this format, beyond that communicated to [place] by the patient/ family members as noted.   - Additionally my office staff/ schedulers were to discuss with the patient that there may be a monetary charge related to this service, depending on their medical insurance.  My understanding is that patient understood and consented to proceed.     _________________________________________________________________________________   History of Present Illness:  I, [name], am serving as Education administrator for [employer].  - Lifestyle He's cut out all sodas, including even the Diet Dr. Samson Frederic.  Says he may drink "one [name], once per week, maybe."  He's made a conscientious effort to drink more water, and is eating more fruits.    Says he hasn't put on any weight during COVID-19, but hasn't lost any either.  Says he typically fluctuates between 212-218 lbs.  Patient is unsure how long he's had diabetes.  Notes before establishing at the clinic here, he was always told he had prediabetes, not diabetes.  HPI:   Diabetes Mellitus:  Home glucose readings:  192 this morning, 157 yesterday morning, 175 the morning before.  Says very first thing in the morning, he's had sugars as high as 214.   - Patient reports that he's only been taking metformin and Victoza.   He does not believe he has ever taken the Jardiance ( which is on his med list and had been last OV as well).     Says  as soon as he switched from [facility] to metformin only ( which he was not just given this), his blood sugars have been "crazy."    Notes went from 120's-130's, to "crazy."  This morning, his sugar was 192.  He is concerned that his A1c is going to be up based on these numbers.  Otherwise denies acute concerns or problems related to treatment plan.  - He denies new concerns.  Denies polyuria/polydipsia, hypo/ hyperglycemia symptoms.  Denies new onset of: chest pain, exercise intolerance, shortness of breath, dizziness, visual changes, headache, lower extremity swelling or claudication.   Last A1C in the office was:  Lab Results  Component Value Date   HGBA1C 7.3 (A) 09/11/2019   HGBA1C 6.3 (H) 03/07/2019   HGBA1C 6.9 (A) 06/26/2018   Lab Results  Component Value Date   MICROALBUR 30 06/26/2018   LDLCALC 56 03/07/2019   CREATININE 1.02 03/07/2019   BP Readings from Last 3 Encounters:  09/11/19 130/87  10/08/18 (!) 143/87  10/04/18 (!) 136/94   Wt Readings from Last 3 Encounters:  09/11/19 216 lb (98 kg)  10/08/18 215 lb (97.5 kg)  10/04/18 217 lb (98.4 kg)    HPI:  Hyperlipidemia:  59 y.o. male here for cholesterol follow-up.   - Patient reports good compliance with treatment plan of:  medication and/ or lifestyle management.    - Patient denies any acute concerns or problems with management plan   - He denies new onset of: myalgias, arthralgias,  increased fatigue more than normal, chest pains, exercise intolerance, shortness of breath, dizziness, visual changes, headache, lower extremity swelling or claudication.   Most recent cholesterol panel was:  Lab Results  Component Value Date   CHOL 107 03/07/2019   HDL 32 (L) 03/07/2019   LDLCALC 56 03/07/2019   TRIG 102 03/07/2019   CHOLHDL 3.3 03/07/2019   Hepatic Function Latest Ref Rng & Units 03/07/2019 03/19/2018 02/11/2017  Total Protein 6.0 - 8.5 g/dL 7.1 6.9 -  Albumin 3.8 - 4.9 g/dL 4.6 4.4 -  AST 0 - 40 IU/L 18 14  24   ALT 0 - 44 IU/L 18 19 25   Alk Phosphatase 39 - 117 IU/L 61 54 53  Total Bilirubin 0.0 - 1.2 mg/dL 0.5 0.2 -    HPI:  Hypertension:  -  His blood pressure at home has been running: "great."  Notes the top number may be a "few numbers off, but yesterday morning it was 125/65, and this morning it was 130/85, I've had 126/69, 129/70, 130/62, 126/70."  Says he "feels pretty good" about his blood pressure.    In March, his highest blood pressure was 143/78.  - Patient reports good compliance with medication and/or lifestyle modification  - His denies acute concerns or problems related to treatment plan  - He denies new onset of: chest pain, exercise intolerance, shortness of breath, dizziness, visual changes, headache, lower extremity swelling or claudication.   Last 3 blood pressure readings in our office are as follows: BP Readings from Last 3 Encounters:  09/11/19 130/87  10/08/18 (!) 143/87  10/04/18 (!) 136/94   Filed Weights   09/11/19 0932  Weight: 216 lb (98 kg)     No flowsheet data found.  Depression screen [facility] 2/9 09/11/2019 09/25/2018 06/26/2018 04/16/2018 03/19/2018  Decreased Interest 0 0 0 0 0  Down, Depressed, Hopeless 0 0 0 0 0  PHQ - 2 Score 0 0 0 0 0  Altered sleeping 0 0 0 0 0  Tired, decreased energy 0 0 0 0 0  Change in appetite 0 0 0 0 0  Feeling bad or failure about yourself  0 0 0 0 0  Trouble concentrating 0 0 0 0 0  Moving slowly or fidgety/restless 0 0 0 0 0  Suicidal thoughts 0 0 0 0 0  PHQ-9 Score 0 0 0 0 0  Difficult doing work/chores - Not difficult at all Not difficult at all Not difficult at all Not difficult at all  Some recent data might be hidden      Impression and Recommendations:     1. Hypertension associated with diabetes ([place])   2. Diabetes mellitus type 2 with complications ([place])-    worsening acutely    3. Hyperlipidemia associated with type 2 diabetes mellitus ([place])      DM Type 2 w/ Complications - Worsening Acutely -  Patient's A1c was 6.3 last check 03/07/2019, and 6.9 a year ago in January of 2020. - Discussed need for A1c re-check near future.  - Extensively reviewed patient's treatment plan during appointment today.  - Discussed with pt that per our medical records, he has been managed on the same medication regimen of Jardiance, metformin, and Victoza for over a year.  Per patient, has not changed his diet or activity levels, and his weight has not significantly changed.  - Per patient, believes he has never taken the Jardiance as prescribed.  - Per patient, has been only taking metformin and Victoza 1.87m  qd as established.   - Jardiance re-prescribed today.  See med list.  - To wean onto new medication, advised patient to begin with one-half tablet for one week, and then increase to one full tablet.  - Continue Victoza, metformin, and add Jardiance as discussed.  See med list.  - Patient knows to monitor his blood sugars closely.  Continue to check FBS and 2 hours after the biggest meal of the day.  Keep log and bring in next OV for review.  - Patient denies personal history of pancreatitis.  Denies family history of thyroid cancers.  - Discussed possibility of beginning ONGLYZA in future as additional option in future if additional management is needed.  - Counseled patient on pathophysiology of disease and discussed various treatment options, which always includes dietary and lifestyle modification as first line.    - Importance of low carb, heart-healthy diet discussed with patient in addition to regular aerobic exercise of 69mn 5d/week or more.   - Return end of April with blood sugar log to review progress after adding Jardiance.    Hyperlipidemia associated w/ Type 2 DM - Cholesterol at goal last check 03/07/2019, stable. - Pt will continue current treatment regimen.  See med list.  - Ongoing prudent dietary changes such as low saturated & trans fat diets for hyperlipidemia and low  carb diets for hypertriglyceridemia discussed with patient.    - Encouraged patient to follow AHA guidelines for regular exercise and also engage in weight loss if BMI above 25.   - We will continue to monitor and re-check as discussed.    Hypertension associated with Type 2 DM - Blood pressure currently is stable, at goal. - Patient will continue current treatment regimen.  See med list.  - Counseled patient on pathophysiology of disease and discussed various treatment options, which always includes dietary and lifestyle modification as first line.   - Lifestyle changes such as dash and heart healthy diets and engaging in a regular exercise program discussed extensively with patient.   - Ambulatory blood pressure monitoring encouraged at least 3 times weekly.  Keep log and bring in every office visit.  Reminded patient that if they ever feel poorly in any way, to check their blood pressure and pulse.  - We will continue to monitor.    Recommendations - Advised patient to return for follow-up every three months for chronic maintenance.    -->However, since poorly controlled BS now, and adding new med, f/up 4 wks- end April   - As part of my medical decision making, I reviewed the following data within the [place] obtained from pt /family, CMA notes reviewed and incorporated if applicable, Labs reviewed, Radiograph/ tests reviewed if applicable and OV notes from prior OV's with me, as well as other specialists she/he has seen since seeing me last, were all reviewed and used in my medical decision making process today.    - Additionally, when appropriate, discussion had with patient regarding our treatment plan, and their biases/concerns about that plan were used in my medical decision making today.    - The patient agreed with the plan and demonstrated an understanding of the instructions.   No barriers to understanding were identified.     - The patient  was advised to call back or seek an in-person evaluation if the symptoms worsen or if the condition fails to improve as anticipated.   Return for lab-only near future for A1c, and OV end of  April for progress on Jardiance.    Orders Placed This Encounter  Procedures  . POCT glycosylated hemoglobin (Hb A1C)    Meds ordered this encounter  Medications  . amLODIPine-Valsartan-HCTZ 5-160-12.5 MG TABS    Sig: Take 1 tablet by mouth daily.    Dispense:  90 tablet    Refill:  0  . liraglutide (VICTOZA) 18 MG/3ML SOPN    Sig: Inject 0.3 mLs (1.8 mg total) into the skin daily.    Dispense:  9 pen    Refill:  0  . metFORMIN (GLUCOPHAGE) 500 MG tablet    Sig: TAKE 2 TABLETS BY MOUTH IN THE MORNING AND 3 TABLETS IN THE EVENING    Dispense:  450 tablet    Refill:  0  . metoprolol succinate (TOPROL-XL) 50 MG 24 hr tablet    Sig: TAKE 1 TABLET BY MOUTH DAILY.    Dispense:  90 tablet    Refill:  0  . simvastatin (ZOCOR) 80 MG tablet    Sig: Take 1 tablet (80 mg total) by mouth at bedtime.    Dispense:  90 tablet    Refill:  0  . empagliflozin (JARDIANCE) 25 MG TABS tablet    Sig: 1/2 tablet for 1 week then increase to 1 full tablet daily    Dispense:  90 tablet    Refill:  0    Medications Discontinued During This Encounter  Medication Reason  . empagliflozin (JARDIANCE) 25 MG TABS tablet Error  . metoprolol succinate (TOPROL-XL) 50 MG 24 hr tablet Reorder  . simvastatin (ZOCOR) 80 MG tablet Reorder  . liraglutide (VICTOZA) 18 MG/3ML SOPN Reorder  . metFORMIN (GLUCOPHAGE) 500 MG tablet Reorder  . amLODIPine-Valsartan-HCTZ 5-160-12.5 MG TABS Reorder      Time spent on visit including pre-visit chart review and post-visit care was 26+ minutes.    Note:  This note was prepared with assistance of Dragon voice recognition software. Occasional wrong-word or sound-a-like substitutions may have occurred due to the inherent limitations of voice recognition software.  The [place] was signed into law in 2016 which includes the topic of electronic health records.  This provides immediate access to information in MyChart.  This includes consultation notes, operative notes, office notes, lab results and pathology reports.  If you have any questions about what you read please let us know at your next visit or call us at the office.  We are right here with you.  This document serves as a record of services personally performed by Russell Dance, DO. It was created on her behalf by [name], a trained medical scribe. The creation of this record is based on the scribe's personal observations and the provider's statements to them.    The above documentation from [name], medical scribe, has been reviewed by Marjory Sneddon, D.O.     __________________________________________________________________________________     Patient Care Team    Relationship Specialty Notifications Start End  Russell Dance, DO PCP - General Family Medicine  03/01/17      -Vitals obtained; medications/ allergies reconciled;  personal medical, social, Sx etc.histories were updated by CMA, reviewed by me and are reflected in chart   Patient Active Problem List   Diagnosis Date Noted  . Type 2 diabetes mellitus without retinopathy ([place]) 02/01/2018  . Diabetes mellitus type 2 with complications ([place]) 83/38/2505  . Hypertension associated with diabetes ([place]) 06/18/2015  . Hyperlipidemia associated with type 2 diabetes mellitus ([place]) 06/18/2015  .  Obesity, Class I, BMI 30-34.9 04/12/2017  . Inactivity 04/12/2017  . Age-related nuclear cataract of both eyes 06/18/2015  . Strain of rotator cuff capsule 04/17/2018  . Impingement syndrome of shoulder region, left 04/16/2018  . Bursitis of shoulder, left 04/16/2018  . Cortical age-related cataract of both eyes 02/01/2018  . Dermatochalasis of both upper eyelids 02/01/2018  . Nuclear sclerotic cataract of both  eyes 02/01/2018  . new onset Murmur, heart 12/19/2017  . Acute pain of left shoulder 10/17/2017  . Presbyopia 06/18/2015  . Inguinal hernia unilateral, non-recurrent 12/13/2010     Current Meds  Medication Sig  . amLODIPine-Valsartan-HCTZ 5-160-12.5 MG TABS Take 1 tablet by mouth daily.  . Cholecalciferol (VITAMIN D3) 125 MCG (5000 UT) TABS 5,000 IU OTC vitamin D3 daily.  Marland Kitchen CINNAMON PO Take 2 capsules by mouth daily.  . Cyanocobalamin (VITAMIN B12 PO) Take 1 tablet by mouth daily.  Marland Kitchen liraglutide (VICTOZA) 18 MG/3ML SOPN Inject 0.3 mLs (1.8 mg total) into the skin daily.  . metFORMIN (GLUCOPHAGE) 500 MG tablet TAKE 2 TABLETS BY MOUTH IN THE MORNING AND 3 TABLETS IN THE EVENING  . metoprolol succinate (TOPROL-XL) 50 MG 24 hr tablet TAKE 1 TABLET BY MOUTH DAILY.  . simvastatin (ZOCOR) 80 MG tablet Take 1 tablet (80 mg total) by mouth at bedtime.  . [DISCONTINUED] amLODIPine-Valsartan-HCTZ 5-160-12.5 MG TABS Take 1 tablet by mouth daily. **PATIENT NEEDS APT FOR FURTHER REFILLS**  . [DISCONTINUED] liraglutide (VICTOZA) 18 MG/3ML SOPN INJECT 0.3 MLS INTO THE SKIN EVERY MORNING **PATIENT NEEDS APPOINTMENT FOR FURTHER REFILLS**  . [DISCONTINUED] metFORMIN (GLUCOPHAGE) 500 MG tablet TAKE 2 TABLETS BY MOUTH IN THE MORNING AND 3 TABLETS IN THE EVENING.**PATIENT NEEDS APT FOR FURTHER REFILLS**  . [DISCONTINUED] metoprolol succinate (TOPROL-XL) 50 MG 24 hr tablet TAKE 1 TABLET BY MOUTH DAILY. **PATIENT NEEDS APPOINTMENT FOR FURTHER REFILLS**  . [DISCONTINUED] simvastatin (ZOCOR) 80 MG tablet TAKE 1 TABLET (80 MG TOTAL) BY MOUTH AT BEDTIME. **PATIENT NEEDS APT FOR FURTHER REFILLS**     Allergies:  No Known Allergies   ROS:  See above HPI for pertinent positives and negatives   Objective:   Blood pressure 130/87, temperature (!) 96.9 F (36.1 C), height 5' 8"  (1.727 m), weight 216 lb (98 kg).  (if some vitals are omitted, this means that patient was UNABLE to obtain them even though they were  asked to get them prior to OV today.  They were asked to call us at their earliest convenience with these once obtained. ) General: A & O * 3; sounds in no acute distress; in usual state of health.  Skin: Pt confirms warm and dry extremities and pink fingertips HEENT: Pt confirms lips non-cyanotic Chest: Patient confirms normal chest excursion and movement Respiratory: speaking in full sentences, no conversational dyspnea; patient confirms no use of accessory muscles Psych: insight appears good, mood- appears full

## 2019-12-12 ENCOUNTER — Other Ambulatory Visit: Payer: Self-pay | Admitting: Family Medicine

## 2019-12-12 DIAGNOSIS — E1169 Type 2 diabetes mellitus with other specified complication: Secondary | ICD-10-CM

## 2019-12-12 DIAGNOSIS — E785 Hyperlipidemia, unspecified: Secondary | ICD-10-CM

## 2019-12-17 ENCOUNTER — Other Ambulatory Visit: Payer: Self-pay | Admitting: Physician Assistant

## 2019-12-17 DIAGNOSIS — E1159 Type 2 diabetes mellitus with other circulatory complications: Secondary | ICD-10-CM

## 2019-12-17 DIAGNOSIS — I152 Hypertension secondary to endocrine disorders: Secondary | ICD-10-CM

## 2019-12-17 DIAGNOSIS — E118 Type 2 diabetes mellitus with unspecified complications: Secondary | ICD-10-CM

## 2019-12-17 NOTE — Telephone Encounter (Signed)
Please call pt to schedule appt.  No further refills until pt is seen.  T. Desiray Orchard, CMA  

## 2020-01-13 ENCOUNTER — Telehealth: Payer: Self-pay | Admitting: Physician Assistant

## 2020-01-13 DIAGNOSIS — I152 Hypertension secondary to endocrine disorders: Secondary | ICD-10-CM

## 2020-01-13 DIAGNOSIS — E1169 Type 2 diabetes mellitus with other specified complication: Secondary | ICD-10-CM

## 2020-01-13 MED ORDER — METOPROLOL SUCCINATE ER 50 MG PO TB24: 50.0000 mg | ORAL_TABLET | Freq: Every day | ORAL | 0 refills | Status: DC

## 2020-01-13 MED ORDER — AMLODIPINE-VALSARTAN-HCTZ 5-160-12.5 MG PO TABS: 1.0000 | ORAL_TABLET | Freq: Every day | ORAL | 0 refills | Status: DC

## 2020-01-13 MED ORDER — EMPAGLIFLOZIN 25 MG PO TABS: 25.0000 mg | ORAL_TABLET | Freq: Every day | ORAL | 0 refills | Status: DC

## 2020-01-13 MED ORDER — SIMVASTATIN 80 MG PO TABS: 80.0000 mg | ORAL_TABLET | Freq: Every day | ORAL | 0 refills | Status: DC

## 2020-01-13 NOTE — Telephone Encounter (Signed)
Patient is aware he is overdue for a f/u appt for med refills. He has scheduled out first available (02/12/20) but is currently out of his amlodipine combo, Jardiance, metoprolol, and simvastatin. He is hoping we can send in a refill to hold him until this appt, if approved please send to [facility].

## 2020-01-13 NOTE — Addendum Note (Signed)
Addended by: Fonnie Mu on: 01/13/2020 03:06 PM   Modules accepted: Orders

## 2020-02-13 ENCOUNTER — Ambulatory Visit: Payer: BC Managed Care – PPO | Admitting: Physician Assistant

## 2020-02-13 ENCOUNTER — Other Ambulatory Visit: Payer: Self-pay

## 2020-02-13 ENCOUNTER — Encounter: Payer: Self-pay | Admitting: Physician Assistant

## 2020-02-13 VITALS — BP 144/77 | HR 84 | Temp 98.6°F | Ht 68.0 in | Wt 213.8 lb

## 2020-02-13 DIAGNOSIS — E118 Type 2 diabetes mellitus with unspecified complications: Secondary | ICD-10-CM

## 2020-02-13 DIAGNOSIS — E1159 Type 2 diabetes mellitus with other circulatory complications: Secondary | ICD-10-CM

## 2020-02-13 DIAGNOSIS — E785 Hyperlipidemia, unspecified: Secondary | ICD-10-CM

## 2020-02-13 DIAGNOSIS — I10 Essential (primary) hypertension: Secondary | ICD-10-CM

## 2020-02-13 DIAGNOSIS — E1169 Type 2 diabetes mellitus with other specified complication: Secondary | ICD-10-CM | POA: Diagnosis not present

## 2020-02-13 DIAGNOSIS — I152 Hypertension secondary to endocrine disorders: Secondary | ICD-10-CM

## 2020-02-13 LAB — POCT UA - MICROALBUMIN
Albumin/Creatinine Ratio, Urine, POC: <30
Creatinine, POC: 100 mg/dL
Microalbumin Ur, POC: 10 mg/L

## 2020-02-13 LAB — POCT GLYCOSYLATED HEMOGLOBIN (HGB A1C): Hemoglobin A1C: 7 % — AB (ref 4.0–5.6)

## 2020-02-13 MED ORDER — METOPROLOL SUCCINATE ER 50 MG PO TB24: 50.0000 mg | ORAL_TABLET | Freq: Every day | ORAL | 0 refills | Status: DC

## 2020-02-13 MED ORDER — AMLODIPINE-VALSARTAN-HCTZ 5-160-12.5 MG PO TABS: 1.0000 | ORAL_TABLET | Freq: Every day | ORAL | 0 refills | Status: DC

## 2020-02-13 MED ORDER — SIMVASTATIN 80 MG PO TABS: 80.0000 mg | ORAL_TABLET | Freq: Every day | ORAL | 0 refills | Status: DC

## 2020-02-13 MED ORDER — METFORMIN HCL 500 MG PO TABS: ORAL_TABLET | ORAL | 0 refills | Status: DC

## 2020-02-13 MED ORDER — VICTOZA 18 MG/3ML ~~LOC~~ SOPN: 1.8000 mg | PEN_INJECTOR | Freq: Every day | SUBCUTANEOUS | 1 refills | Status: DC

## 2020-02-13 MED ORDER — EMPAGLIFLOZIN 25 MG PO TABS: 25.0000 mg | ORAL_TABLET | Freq: Every day | ORAL | 0 refills | Status: DC

## 2020-02-13 NOTE — Progress Notes (Signed)
Established Patient Office Visit  Subjective:  Patient ID: Russell Robles, male    DOB: [birth year]  Age: 59 y.o. MRN: [id number]  CC:  Chief Complaint  Patient presents with  . Hypertension  . Hyperlipidemia  . Diabetes    HPI Atanacio Melnyk presents for follow up on hypertension, diabetes mellitus, and hyperlipidemia. Pt reports he has been out of his medications for almost 2 months.  HTN: Pt denies chest pain, palpitations, dizziness or leg swelling. Tolerating medication without side effects. Needs refills on medication. Checks BP at home and readings range in 120-130/70-80. Pt follows a low salt diet.  Diabetes: Pt denies increased urination or thirst. No hypoglycemic events. Checking glucose at home. FBS range 120-130, and some high readings in 200. Continues to monitor sugar and carbohydrates.  HLD: Pt needs refill on simvastatin. Reports he has reduced red meat and eating more fish, chicken and vegetables.     Past Medical History:  Diagnosis Date  . Anxiety   . Arthritis   . Cough    PER PT NON-PRODUCTIVE , NO FEVER OR CONGESTION  . Hernia, inguinal, right   . Hyperlipidemia   . Hypertension   . Type 2 diabetes mellitus ([place])    last  HbA1c 7.2 on 05-09-2017 in epic    Past Surgical History:  Procedure Laterality Date  . CARPAL TUNNEL RELEASE Left 08/02/2018   Procedure: CARPAL TUNNEL RELEASE;  Surgeon: Thornton Park, MD;  Location: [facility];  Service: Orthopedics;  Laterality: Left;  . INGUINAL HERNIA REPAIR Left 12-16-2010  dr Donne Hazel  [facility]  . INGUINAL HERNIA REPAIR Right 06/01/2017   Procedure: OPEN RIGHT INGUINAL HERNIA REPAIR WITH MESH;  Surgeon: Kinsinger, Arta Bruce, MD;  Location: [place];  Service: General;  Laterality: Right;  GENERAL AND TAP BLOCK  . INSERTION OF MESH Right 06/01/2017   Procedure: INSERTION OF MESH;  Surgeon: Kinsinger, Arta Bruce, MD;  Location: [facility];  Service: General;   Laterality: Right;  GENERAL AND TAP BLOCK    Family History  Problem Relation Age of Onset  . COPD Mother   . Aortic aneurysm Father   . Lung cancer Sister     Social History   Socioeconomic History  . Marital status: Married    Spouse name: Not on file  . Number of children: Not on file  . Years of education: Not on file  . Highest education level: Not on file  Occupational History  . Not on file  Tobacco Use  . Smoking status: Never Smoker  . Smokeless tobacco: Never Used  Vaping Use  . Vaping Use: Never used  Substance and Sexual Activity  . Alcohol use: Yes    Comment: occ  . Drug use: No  . Sexual activity: Yes    Partners: Female    Birth control/protection: None  Other Topics Concern  . Not on file  Social History Narrative  . Not on file   Social Determinants of Health   Financial Resource Strain:   . Difficulty of Paying Living Expenses: Not on file  Food Insecurity:   . Worried About Charity fundraiser in the Last Year: Not on file  . Ran Out of Food in the Last Year: Not on file  Transportation Needs:   . Lack of Transportation (Medical): Not on file  . Lack of Transportation (Non-Medical): Not on file  Physical Activity:   . Days of Exercise per Week: Not on file  .  Minutes of Exercise per Session: Not on file  Stress:   . Feeling of Stress : Not on file  Social Connections:   . Frequency of Communication with Friends and Family: Not on file  . Frequency of Social Gatherings with Friends and Family: Not on file  . Attends Religious Services: Not on file  . Active Member of Clubs or Organizations: Not on file  . Attends Archivist Meetings: Not on file  . Marital Status: Not on file  Intimate Partner Violence:   . Fear of Current or Ex-Partner: Not on file  . Emotionally Abused: Not on file  . Physically Abused: Not on file  . Sexually Abused: Not on file    Outpatient Medications Prior to Visit  Medication Sig Dispense Refill   . Cholecalciferol (VITAMIN D3) 125 MCG (5000 UT) TABS 5,000 IU OTC vitamin D3 daily. 90 tablet 3  . CINNAMON PO Take 2 capsules by mouth daily.    . Cyanocobalamin (VITAMIN B12 PO) Take 1 tablet by mouth daily.    Marland Kitchen amLODIPine-Valsartan-HCTZ 5-160-12.5 MG TABS Take 1 tablet by mouth daily. 30 tablet 0  . empagliflozin (JARDIANCE) 25 MG TABS tablet Take 1 tablet (25 mg total) by mouth daily. 30 tablet 0  . metFORMIN (GLUCOPHAGE) 500 MG tablet TAKE 2 TABLETS BY MOUTH IN THE MORNING AND 3 TABLETS IN THE EVENING 450 tablet 0  . metoprolol succinate (TOPROL-XL) 50 MG 24 hr tablet Take 1 tablet (50 mg total) by mouth daily. Take with or immediately following a meal. 30 tablet 0  . simvastatin (ZOCOR) 80 MG tablet Take 1 tablet (80 mg total) by mouth at bedtime. 30 tablet 0  . VICTOZA 18 MG/3ML SOPN INJECT 0.3 MLS (1.8 MG TOTAL) INTO THE SKIN DAILY. 6 mL 0   No facility-administered medications prior to visit.    No Known Allergies  ROS Review of Systems A fourteen system review of systems was performed and found to be positive as per HPI.   Objective:    Physical Exam General: Well nourished, in no apparent distress. Eyes: PERRLA, EOMs, conjunctiva clr Resp: Respiratory effort- normal, ECTA B/L w/o W/R/R  Cardio: RRR w/o MRGs. Abdomen: no gross distention. Lymphatics:  less 2 sec cap RF, no gross edema M-sk: Full ROM, good strength, normal gait.  Skin: Warm, dry  Neuro: Alert, Oriented Psych: Normal affect, Insight and Judgment appropriate.   BP (!) 144/77   Pulse 84   Temp 98.6 F (37 C) (Oral)   Ht 5\' 8"  (1.727 m)   Wt 213 lb 12.8 oz (97 kg)   SpO2 95%   BMI 32.51 kg/m  Wt Readings from Last 3 Encounters:  02/13/20 213 lb 12.8 oz (97 kg)  09/11/19 216 lb (98 kg)  10/08/18 215 lb (97.5 kg)     Health Maintenance Due  Topic Date Due  . PNEUMOCOCCAL POLYSACCHARIDE VACCINE AGE 24-64 HIGH RISK  Never done  . COVID-19 Vaccine (1) Never done  . COLONOSCOPY  Never done  .  INFLUENZA VACCINE  01/19/2020  . OPHTHALMOLOGY EXAM  02/06/2020    There are no preventive care reminders to display for this patient.  Lab Results  Component Value Date   TSH 1.050 03/07/2019   Lab Results  Component Value Date   WBC 9.8 03/07/2019   HGB 14.3 03/07/2019   HCT 42.2 03/07/2019   MCV 85 03/07/2019   PLT 279 03/07/2019   Lab Results  Component Value Date   NA 137  03/07/2019   K 3.9 03/07/2019   CO2 20 03/07/2019   GLUCOSE 149 (H) 03/07/2019   BUN 13 03/07/2019   CREATININE 1.02 03/07/2019   BILITOT 0.5 03/07/2019   ALKPHOS 61 03/07/2019   AST 18 03/07/2019   ALT 18 03/07/2019   PROT 7.1 03/07/2019   ALBUMIN 4.6 03/07/2019   CALCIUM 9.6 03/07/2019   ANIONGAP 9 08/01/2018   Lab Results  Component Value Date   CHOL 107 03/07/2019   Lab Results  Component Value Date   HDL 32 (L) 03/07/2019   Lab Results  Component Value Date   LDLCALC 56 03/07/2019   Lab Results  Component Value Date   TRIG 102 03/07/2019   Lab Results  Component Value Date   CHOLHDL 3.3 03/07/2019   Lab Results  Component Value Date   HGBA1C 7.0 (A) 02/13/2020      Assessment & Plan:   Problem List Items Addressed This Visit      Cardiovascular and Mediastinum   Hypertension associated with diabetes ([place]) (Chronic)   Relevant Medications   amLODIPine-Valsartan-HCTZ 5-160-12.5 MG TABS   empagliflozin (JARDIANCE) 25 MG TABS tablet   metFORMIN (GLUCOPHAGE) 500 MG tablet   metoprolol succinate (TOPROL-XL) 50 MG 24 hr tablet   simvastatin (ZOCOR) 80 MG tablet   liraglutide (VICTOZA) 18 MG/3ML SOPN     Endocrine   Diabetes mellitus type 2 with complications ([name]) (Chronic)   Relevant Medications   amLODIPine-Valsartan-HCTZ 5-160-12.5 MG TABS   empagliflozin (JARDIANCE) 25 MG TABS tablet   metFORMIN (GLUCOPHAGE) 500 MG tablet   simvastatin (ZOCOR) 80 MG tablet   liraglutide (VICTOZA) 18 MG/3ML SOPN   Hyperlipidemia associated with type 2 diabetes mellitus ([name])  (Chronic)   Relevant Medications   amLODIPine-Valsartan-HCTZ 5-160-12.5 MG TABS   empagliflozin (JARDIANCE) 25 MG TABS tablet   metFORMIN (GLUCOPHAGE) 500 MG tablet   simvastatin (ZOCOR) 80 MG tablet   liraglutide (VICTOZA) 18 MG/3ML SOPN   Type 2 diabetes mellitus without retinopathy ([name]) - Primary   Relevant Medications   amLODIPine-Valsartan-HCTZ 5-160-12.5 MG TABS   empagliflozin (JARDIANCE) 25 MG TABS tablet   metFORMIN (GLUCOPHAGE) 500 MG tablet   simvastatin (ZOCOR) 80 MG tablet   liraglutide (VICTOZA) 18 MG/3ML SOPN   Other Relevant Orders   POCT glycosylated hemoglobin (Hb A1C) (Completed)   POCT UA - Microalbumin (Completed)     Type 2 diabetes mellitus with complications: -Surprisingly A1c today improved from prior and patient has been out of medications. -Continue current medication regimen. Provided refills. -Continue ambulatory glucose monitoring. -Continue low carbohydrate and glucose diet. -Stay as active as possible. -UA microalbumin performed today, wnl (A:C <30)  Hypertension associated with diabetes: -BP mildly elevated, most likely due to being without antihypertensive meds. -Continue current medication regimen. Provided refills. -Continue ambulatory BP/pulse monitoring. -Continue low sodium diet.  Hyperlipidemia associated with T2DM: -Last lipid panel wnl with exception of low HDL. -Continue Simvastatin. Provided refill. -Continue with dietary changes and follow a heart healthy diet. -Plan to recheck lipid panel and hepatic function with CPE.    Meds ordered this encounter  Medications  . amLODIPine-Valsartan-HCTZ 5-160-12.5 MG TABS    Sig: Take 1 tablet by mouth daily.    Dispense:  90 tablet    Refill:  0  . empagliflozin (JARDIANCE) 25 MG TABS tablet    Sig: Take 1 tablet (25 mg total) by mouth daily.    Dispense:  90 tablet    Refill:  0  . metFORMIN (GLUCOPHAGE) 500 MG  tablet    Sig: TAKE 2 TABLETS BY MOUTH IN THE MORNING AND 3 TABLETS  IN THE EVENING    Dispense:  450 tablet    Refill:  0  . metoprolol succinate (TOPROL-XL) 50 MG 24 hr tablet    Sig: Take 1 tablet (50 mg total) by mouth daily. Take with or immediately following a meal.    Dispense:  90 tablet    Refill:  0  . simvastatin (ZOCOR) 80 MG tablet    Sig: Take 1 tablet (80 mg total) by mouth at bedtime.    Dispense:  90 tablet    Refill:  0  . liraglutide (VICTOZA) 18 MG/3ML SOPN    Sig: Inject 0.3 mLs (1.8 mg total) into the skin daily.    Dispense:  6 mL    Refill:  1    Follow-up: Return in about 3 months (around 05/15/2020) for CPE and FBW .    Lorrene Reid, PA-C

## 2020-02-13 NOTE — Patient Instructions (Signed)

## 2020-03-06 ENCOUNTER — Other Ambulatory Visit: Payer: Self-pay | Admitting: Physician Assistant

## 2020-04-27 DIAGNOSIS — S46112A Strain of muscle, fascia and tendon of long head of biceps, left arm, initial encounter: Secondary | ICD-10-CM | POA: Diagnosis not present

## 2020-04-27 DIAGNOSIS — E119 Type 2 diabetes mellitus without complications: Secondary | ICD-10-CM | POA: Diagnosis not present

## 2020-04-27 DIAGNOSIS — H25011 Cortical age-related cataract, right eye: Secondary | ICD-10-CM | POA: Diagnosis not present

## 2020-04-27 DIAGNOSIS — Z7984 Long term (current) use of oral hypoglycemic drugs: Secondary | ICD-10-CM | POA: Diagnosis not present

## 2020-04-27 DIAGNOSIS — S46119A Strain of muscle, fascia and tendon of long head of biceps, unspecified arm, initial encounter: Secondary | ICD-10-CM | POA: Insufficient documentation

## 2020-04-27 DIAGNOSIS — H2513 Age-related nuclear cataract, bilateral: Secondary | ICD-10-CM | POA: Diagnosis not present

## 2020-05-18 ENCOUNTER — Ambulatory Visit (INDEPENDENT_AMBULATORY_CARE_PROVIDER_SITE_OTHER): Payer: BC Managed Care – PPO | Admitting: Physician Assistant

## 2020-05-18 ENCOUNTER — Other Ambulatory Visit: Payer: Self-pay

## 2020-05-18 VITALS — BP 129/73 | HR 68 | Ht 68.0 in | Wt 222.4 lb

## 2020-05-18 DIAGNOSIS — Z1211 Encounter for screening for malignant neoplasm of colon: Secondary | ICD-10-CM

## 2020-05-18 DIAGNOSIS — E669 Obesity, unspecified: Secondary | ICD-10-CM

## 2020-05-18 DIAGNOSIS — E1169 Type 2 diabetes mellitus with other specified complication: Secondary | ICD-10-CM

## 2020-05-18 DIAGNOSIS — E785 Hyperlipidemia, unspecified: Secondary | ICD-10-CM

## 2020-05-18 DIAGNOSIS — I152 Hypertension secondary to endocrine disorders: Secondary | ICD-10-CM

## 2020-05-18 DIAGNOSIS — Z0000 Encounter for general adult medical examination without abnormal findings: Secondary | ICD-10-CM | POA: Diagnosis not present

## 2020-05-18 DIAGNOSIS — E119 Type 2 diabetes mellitus without complications: Secondary | ICD-10-CM

## 2020-05-18 DIAGNOSIS — Z2821 Immunization not carried out because of patient refusal: Secondary | ICD-10-CM

## 2020-05-18 DIAGNOSIS — E1159 Type 2 diabetes mellitus with other circulatory complications: Secondary | ICD-10-CM

## 2020-05-18 DIAGNOSIS — H6121 Impacted cerumen, right ear: Secondary | ICD-10-CM

## 2020-05-18 DIAGNOSIS — E559 Vitamin D deficiency, unspecified: Secondary | ICD-10-CM

## 2020-05-18 NOTE — Patient Instructions (Signed)
Preventive Care 41-59 Years Old, Male Preventive care refers to lifestyle choices and visits with your health care provider that can promote health and wellness. This includes:  A yearly physical exam. This is also called an annual well check.  Regular dental and eye exams.  Immunizations.  Screening for certain conditions.  Healthy lifestyle choices, such as eating a healthy diet, getting regular exercise, not using drugs or products that contain nicotine and tobacco, and limiting alcohol use. What can I expect for my preventive care visit? Physical exam Your health care provider will check:  Height and weight. These may be used to calculate body mass index (BMI), which is a measurement that tells if you are at a healthy weight.  Heart rate and blood pressure.  Your skin for abnormal spots. Counseling Your health care provider may ask you questions about:  Alcohol, tobacco, and drug use.  Emotional well-being.  Home and relationship well-being.  Sexual activity.  Eating habits.  Work and work Statistician. What immunizations do I need?  Influenza (flu) vaccine  This is recommended every year. Tetanus, diphtheria, and pertussis (Tdap) vaccine  You may need a Td booster every 10 years. Varicella (chickenpox) vaccine  You may need this vaccine if you have not already been vaccinated. Zoster (shingles) vaccine  You may need this after age 64. Measles, mumps, and rubella (MMR) vaccine  You may need at least one dose of MMR if you were born in 1957 or later. You may also need a second dose. Pneumococcal conjugate (PCV13) vaccine  You may need this if you have certain conditions and were not previously vaccinated. Pneumococcal polysaccharide (PPSV23) vaccine  You may need one or two doses if you smoke cigarettes or if you have certain conditions. Meningococcal conjugate (MenACWY) vaccine  You may need this if you have certain conditions. Hepatitis A  vaccine  You may need this if you have certain conditions or if you travel or work in places where you may be exposed to hepatitis A. Hepatitis B vaccine  You may need this if you have certain conditions or if you travel or work in places where you may be exposed to hepatitis B. Haemophilus influenzae type b (Hib) vaccine  You may need this if you have certain risk factors. Human papillomavirus (HPV) vaccine  If recommended by your health care provider, you may need three doses over 6 months. You may receive vaccines as individual doses or as more than one vaccine together in one shot (combination vaccines). Talk with your health care provider about the risks and benefits of combination vaccines. What tests do I need? Blood tests  Lipid and cholesterol levels. These may be checked every 5 years, or more frequently if you are over 60 years old.  Hepatitis C test.  Hepatitis B test. Screening  Lung cancer screening. You may have this screening every year starting at age 59 if you have a 30-pack-year history of smoking and currently smoke or have quit within the past 15 years.  Prostate cancer screening. Recommendations will vary depending on your family history and other risks.  Colorectal cancer screening. All adults should have this screening starting at age 72 and continuing until age 59. Your health care provider may recommend screening at age 59 if you are at increased risk. You will have tests every 1-10 years, depending on your results and the type of screening test.  Diabetes screening. This is done by checking your blood sugar (glucose) after you have not eaten  for a while (fasting). You may have this done every 1-3 years.  Sexually transmitted disease (STD) testing. Follow these instructions at home: Eating and drinking  Eat a diet that includes fresh fruits and vegetables, whole grains, lean protein, and low-fat dairy products.  Take vitamin and mineral supplements as  recommended by your health care provider.  Do not drink alcohol if your health care provider tells you not to drink.  If you drink alcohol: ? Limit how much you have to 0-2 drinks a day. ? Be aware of how much alcohol is in your drink. In the [place] one drink equals one 12 oz bottle of beer (355 mL), one 5 oz glass of wine (148 mL), or one 1 oz glass of hard liquor (44 mL). Lifestyle  Take daily care of your teeth and gums.  Stay active. Exercise for at least 30 minutes on 5 or more days each week.  Do not use any products that contain nicotine or tobacco, such as cigarettes, e-cigarettes, and chewing tobacco. If you need help quitting, ask your health care provider.  If you are sexually active, practice safe sex. Use a condom or other form of protection to prevent STIs (sexually transmitted infections).  Talk with your health care provider about taking a low-dose aspirin every day starting at age 53. What's next?  Go to your health care provider once a year for a well check visit.  Ask your health care provider how often you should have your eyes and teeth checked.  Stay up to date on all vaccines. This information is not intended to replace advice given to you by your health care provider. Make sure you discuss any questions you have with your health care provider. Document Revised: 05/31/2018 Document Reviewed: 05/31/2018 Elsevier Patient Education  2020 [employer].

## 2020-05-18 NOTE — Progress Notes (Signed)
Male physical   Impression and Recommendations:    1. Healthcare maintenance   2. Screening for colon cancer   3. Hypertension associated with diabetes ([place])   4. Hyperlipidemia associated with type 2 diabetes mellitus ([place])   5. Type 2 diabetes mellitus without retinopathy ([place])   6. Vitamin D deficiency      1) Anticipatory Guidance: Discussed skin CA prevention and sunscreen when outside along with skin surveillance; eating a balanced and modest diet; physical activity at least 25 minutes per day or minimum of 150 min/ week moderate to intense activity.  2) Immunizations / Screenings / Labs:   All immunizations are up-to-date per recommendations or will be updated today if pt allows.    - Patient understands with dental and vision screens they will schedule independently.  - Will obtain CBC, CMP, HgA1c, PSA, Lipid panel, TSH and vit D when fasting.  - Pt reports had colonoscopy around age of 61 but there isn't a record on file. Will place order. - UTD on Tdap, Hep C and HIV screenings. Completed Covid vaccine series. - Declined influenza vaccine.  3) Weight:  Recommend to improve diet habits to improve overall feelings of well being and objective health data. Improve nutrient density of diet through increasing intake of fruits and vegetables and decreasing saturated fats, white flour products and refined sugars.   4) Healthcare Maintenance: -Continue current medication regimen. -Follow a heart healthy diet and monitor carbohydrates and glucose.  -Stay well hydrated. -Discussed ENT referral for chronic tinnitus and patient will notify me if decides to pursue. Will continue to monitor symptoms. -Right ear irrigation not performed. Recommend to use OTC debrox ear drops. -Follow up in 3 months for DM, HTN, HLD   Orders Placed This Encounter  Procedures  . CBC    Standing Status:   Future    Standing Expiration Date:   06/17/2020  . Comprehensive metabolic panel     Standing Status:   Future    Standing Expiration Date:   06/17/2020    Order Specific Question:   Has the patient fasted?    Answer:   Yes  . Hemoglobin A1c    Standing Status:   Future    Standing Expiration Date:   06/17/2020  . TSH    Standing Status:   Future    Standing Expiration Date:   06/17/2020  . Lipid panel    Standing Status:   Future    Standing Expiration Date:   06/17/2020    Order Specific Question:   Has the patient fasted?    Answer:   Yes  . PSA    Standing Status:   Future    Standing Expiration Date:   06/17/2020  . VITAMIN D 25 Hydroxy (Vit-D Deficiency, Fractures)    Standing Status:   Future    Standing Expiration Date:   06/17/2020  . Ambulatory referral to Gastroenterology    Referral Priority:   Routine    Referral Type:   Consultation    Referral Reason:   Specialty Services Required    Number of Visits Requested:   1    No orders of the defined types were placed in this encounter.    Return in about 3 months (around 08/17/2020) for DM, HTN, HLD.    Gross side effects, risk and benefits, and alternatives of medications discussed with patient.  Patient is aware that all medications have potential side effects and we are unable to predict every  side effect or drug-drug interaction that may occur.  Expresses verbal understanding and consents to current therapy plan and treatment regimen.  Please see AVS handed out to patient at the end of our visit for further patient instructions/ counseling done pertaining to today's office visit.      Subjective:        CC: CPE   HPI: Bolden Hagerman is a 59 y.o. male who presents to [place] at [facility] today for a yearly health maintenance exam.     Health Maintenance Summary  - Reviewed and updated, unless pt declines services.  Last Cologuard or Colonoscopy:  Placed order. Family history of Colon CA: No  Tobacco History Reviewed: Y, never a smoker Alcohol / drug use:     No concerns, no excessive use / no use Dental Home: Y  Eye exams:Y Dermatology home:Y Male history: STD concerns:   none Additional penile/ urinary concerns: none    Immunization History  Administered Date(s) Administered  . Influenza,inj,Quad PF,6+ Mos 06/19/2015, 08/01/2017, 06/26/2018  . Influenza-Unspecified 03/19/2014, 06/19/2015  . Tdap 06/08/2012     Health Maintenance  Topic Date Due  . PNEUMOCOCCAL POLYSACCHARIDE VACCINE AGE 26-64 HIGH RISK  Never done  . COVID-19 Vaccine (1) Never done  . COLONOSCOPY  Never done  . INFLUENZA VACCINE  01/19/2020  . OPHTHALMOLOGY EXAM  02/06/2020  . HEMOGLOBIN A1C  08/15/2020  . FOOT EXAM  02/12/2021  . TETANUS/TDAP  06/08/2022  . Hepatitis C Screening  Completed  . HIV Screening  Completed       Wt Readings from Last 3 Encounters:  05/18/20 222 lb 6.4 oz (100.9 kg)  02/13/20 213 lb 12.8 oz (97 kg)  09/11/19 216 lb (98 kg)   BP Readings from Last 3 Encounters:  05/18/20 129/73  02/13/20 (!) 144/77  09/11/19 130/87   Pulse Readings from Last 3 Encounters:  05/18/20 68  02/13/20 84  10/08/18 63    Patient Active Problem List   Diagnosis Date Noted  . Strain of rotator cuff capsule 04/17/2018  . Impingement syndrome of shoulder region, left 04/16/2018  . Bursitis of shoulder, left 04/16/2018  . Cortical age-related cataract of both eyes 02/01/2018  . Dermatochalasis of both upper eyelids 02/01/2018  . Nuclear sclerotic cataract of both eyes 02/01/2018  . Type 2 diabetes mellitus without retinopathy ([place]) 02/01/2018  . new onset Murmur, heart 12/19/2017  . Acute pain of left shoulder 10/17/2017  . Obesity, Class I, BMI 30-34.9 04/12/2017  . Inactivity 04/12/2017  . Age-related nuclear cataract of both eyes 06/18/2015  . Diabetes mellitus type 2 with complications ([place]) 70/96/2836  . Hypertension associated with diabetes ([place]) 06/18/2015  . Hyperlipidemia associated with type 2 diabetes mellitus ([place]) 06/18/2015   . Presbyopia 06/18/2015  . Inguinal hernia unilateral, non-recurrent 12/13/2010    Past Medical History:  Diagnosis Date  . Anxiety   . Arthritis   . Cough    PER PT NON-PRODUCTIVE , NO FEVER OR CONGESTION  . Hernia, inguinal, right   . Hyperlipidemia   . Hypertension   . Type 2 diabetes mellitus ([place])    last  HbA1c 7.2 on 05-09-2017 in epic    Past Surgical History:  Procedure Laterality Date  . CARPAL TUNNEL RELEASE Left 08/02/2018   Procedure: CARPAL TUNNEL RELEASE;  Surgeon: Thornton Park, MD;  Location: [facility];  Service: Orthopedics;  Laterality: Left;  . HERNIA REPAIR N/A    Phreesia 05/18/2020  . INGUINAL HERNIA REPAIR  Left 12-16-2010  dr Donne Hazel  [facility]  . INGUINAL HERNIA REPAIR Right 06/01/2017   Procedure: OPEN RIGHT INGUINAL HERNIA REPAIR WITH MESH;  Surgeon: Kinsinger, Arta Bruce, MD;  Location: [place];  Service: General;  Laterality: Right;  GENERAL AND TAP BLOCK  . INSERTION OF MESH Right 06/01/2017   Procedure: INSERTION OF MESH;  Surgeon: Kinsinger, Arta Bruce, MD;  Location: [facility];  Service: General;  Laterality: Right;  GENERAL AND TAP BLOCK    Family History  Problem Relation Age of Onset  . COPD Mother   . Aortic aneurysm Father   . Lung cancer Sister     Social History   Substance and Sexual Activity  Drug Use No  ,  Social History   Substance and Sexual Activity  Alcohol Use Yes   Comment: occ  ,  Social History   Tobacco Use  Smoking Status Never Smoker  Smokeless Tobacco Never Used  ,  Social History   Substance and Sexual Activity  Sexual Activity Yes  . Partners: Female  . Birth control/protection: None    Patient's Medications  New Prescriptions   No medications on file  Previous Medications   AMLODIPINE-VALSARTAN-HCTZ 5-160-12.5 MG TABS    Take 1 tablet by mouth daily.   CHOLECALCIFEROL (VITAMIN D3) 125 MCG (5000 UT) TABS    5,000 IU OTC vitamin D3 daily.   CINNAMON PO     Take 2 capsules by mouth daily.   CYANOCOBALAMIN (VITAMIN B12 PO)    Take 1 tablet by mouth daily.   EMPAGLIFLOZIN (JARDIANCE) 25 MG TABS TABLET    Take 1 tablet (25 mg total) by mouth daily.   HYDROCHLOROTHIAZIDE (HYDRODIURIL) 12.5 MG TABLET    TAKE 1 TABLET BY MOUTH DAILY TAKE WITH AMLODIPINE/VALSARTAN   LIRAGLUTIDE (VICTOZA) 18 MG/3ML SOPN    Inject 0.3 mLs (1.8 mg total) into the skin daily.   METFORMIN (GLUCOPHAGE) 500 MG TABLET    TAKE 2 TABLETS BY MOUTH IN THE MORNING AND 3 TABLETS IN THE EVENING   METOPROLOL SUCCINATE (TOPROL-XL) 50 MG 24 HR TABLET    Take 1 tablet (50 mg total) by mouth daily. Take with or immediately following a meal.   SIMVASTATIN (ZOCOR) 80 MG TABLET    Take 1 tablet (80 mg total) by mouth at bedtime.  Modified Medications   No medications on file  Discontinued Medications   No medications on file    Patient has no known allergies.  Review of Systems: General:   Denies fever, chills, unexplained weight loss.  Optho/Auditory:   Denies visual changes, blurred vision/LOV, +tinnitus  Respiratory:   Denies SOB, DOE more than baseline levels.   Cardiovascular:   Denies chest pain, palpitations, new onset peripheral edema  Gastrointestinal:   Denies nausea, vomiting, diarrhea.  Genitourinary: Denies dysuria, freq/ urgency, flank pain Endocrine:     Denies hot or cold intolerance, polyuria, polydipsia. Musculoskeletal:   Denies unexplained myalgias, joint swelling, unexplained arthralgias.  Skin:  Denies rash, suspicious lesions Neurological:     Denies dizziness, unexplained weakness, numbness  Psychiatric/Behavioral:   Denies mood changes, suicidal or homicidal ideations, hallucinations    Objective:     Blood pressure 129/73, pulse 68, height 5\' 8"  (1.727 m), weight 222 lb 6.4 oz (100.9 kg), SpO2 93 %. Body mass index is 33.82 kg/m. General Appearance:    Alert, cooperative, no distress, appears stated age  Head:    Normocephalic, without obvious  abnormality, atraumatic  Eyes:  PERRL, conjunctiva/corneas clear, EOM's intact, both eyes  Ears:    Normal TM's and external ear canals, left ear; cerumen impaction of right ear  Nose:   Nares normal, septum midline, mucosa normal, no drainage    or sinus tenderness  Throat:   Lips w/o lesion, mucosa moist, and tongue normal; teeth and gums normal  Neck:   Supple, symmetrical, trachea midline, no adenopathy;    thyroid: no enlargement/tenderness/nodules; no carotid   bruit or JVD  Back:     Symmetric, no curvature, ROM normal, no CVA tenderness  Lungs:     Clear to auscultation bilaterally, respirations unlabored, no  Wh/ R/ R  Chest Wall:    No tenderness or gross deformity; normal excursion   Heart:    Regular rate and rhythm, S1 and S2 normal, no murmur, rub   or gallop  Abdomen:     Soft, non-tender, bowel sounds active all four quadrants, No G/R/R, no masses, no organomegaly  Genitalia:   Deferred by pt.  Rectal:   Deferred by pt.  Extremities:   Extremities normal, atraumatic, no cyanosis or gross edema  Pulses:   2+ and symmetric all extremities  Skin:   Warm, dry, Skin color, texture, turgor normal, no obvious rashes or lesions  M-Sk:   Ambulates * 4 w/o difficulty, no gross deformities, tone WNL  Neurologic:   CNII-XII intact, normal strength, sensation and reflexes    Throughout Psych:  No HI/SI, judgement and insight good, Euthymic mood. Full Affect.

## 2020-05-19 ENCOUNTER — Other Ambulatory Visit: Payer: BC Managed Care – PPO

## 2020-05-19 DIAGNOSIS — E1169 Type 2 diabetes mellitus with other specified complication: Secondary | ICD-10-CM | POA: Diagnosis not present

## 2020-05-19 DIAGNOSIS — Z0000 Encounter for general adult medical examination without abnormal findings: Secondary | ICD-10-CM

## 2020-05-19 DIAGNOSIS — E559 Vitamin D deficiency, unspecified: Secondary | ICD-10-CM | POA: Diagnosis not present

## 2020-05-19 DIAGNOSIS — E1159 Type 2 diabetes mellitus with other circulatory complications: Secondary | ICD-10-CM

## 2020-05-19 DIAGNOSIS — I152 Hypertension secondary to endocrine disorders: Secondary | ICD-10-CM | POA: Diagnosis not present

## 2020-05-19 DIAGNOSIS — E119 Type 2 diabetes mellitus without complications: Secondary | ICD-10-CM

## 2020-05-19 DIAGNOSIS — E785 Hyperlipidemia, unspecified: Secondary | ICD-10-CM | POA: Diagnosis not present

## 2020-05-20 LAB — COMPREHENSIVE METABOLIC PANEL
ALT: 19 IU/L (ref 0–44)
AST: 20 IU/L (ref 0–40)
Albumin/Globulin Ratio: 1.8 (ref 1.2–2.2)
Albumin: 4.6 g/dL (ref 3.8–4.9)
Alkaline Phosphatase: 63 IU/L (ref 44–121)
BUN/Creatinine Ratio: 12 (ref 9–20)
BUN: 13 mg/dL (ref 6–24)
Bilirubin Total: 0.4 mg/dL (ref 0.0–1.2)
CO2: 23 mmol/L (ref 20–29)
Calcium: 10.1 mg/dL (ref 8.7–10.2)
Chloride: 101 mmol/L (ref 96–106)
Creatinine, Ser: 1.13 mg/dL (ref 0.76–1.27)
GFR calc Af Amer: 82 mL/min/{1.73_m2} (ref >59)
GFR calc non Af Amer: 71 mL/min/{1.73_m2} (ref >59)
Globulin, Total: 2.5 g/dL (ref 1.5–4.5)
Glucose: 147 mg/dL — ABNORMAL HIGH (ref 65–99)
Potassium: 4 mmol/L (ref 3.5–5.2)
Sodium: 141 mmol/L (ref 134–144)
Total Protein: 7.1 g/dL (ref 6.0–8.5)

## 2020-05-20 LAB — CBC
Hematocrit: 44 % (ref 37.5–51.0)
Hemoglobin: 15.4 g/dL (ref 13.0–17.7)
MCH: 29.4 pg (ref 26.6–33.0)
MCHC: 35 g/dL (ref 31.5–35.7)
MCV: 84 fL (ref 79–97)
Platelets: 293 10*3/uL (ref 150–450)
RBC: 5.24 x10E6/uL (ref 4.14–5.80)
RDW: 12.5 % (ref 11.6–15.4)
WBC: 10.9 10*3/uL — ABNORMAL HIGH (ref 3.4–10.8)

## 2020-05-20 LAB — LIPID PANEL
Chol/HDL Ratio: 4.8 ratio (ref 0.0–5.0)
Cholesterol, Total: 178 mg/dL (ref 100–199)
HDL: 37 mg/dL — ABNORMAL LOW (ref >39)
LDL Chol Calc (NIH): 100 mg/dL — ABNORMAL HIGH (ref 0–99)
Triglycerides: 239 mg/dL — ABNORMAL HIGH (ref 0–149)
VLDL Cholesterol Cal: 41 mg/dL — ABNORMAL HIGH (ref 5–40)

## 2020-05-20 LAB — TSH: TSH: 1.16 u[IU]/mL (ref 0.450–4.500)

## 2020-05-20 LAB — HEMOGLOBIN A1C
Est. average glucose Bld gHb Est-mCnc: 154 mg/dL
Hgb A1c MFr Bld: 7 % — ABNORMAL HIGH (ref 4.8–5.6)

## 2020-05-20 LAB — VITAMIN D 25 HYDROXY (VIT D DEFICIENCY, FRACTURES): Vit D, 25-Hydroxy: 34.7 ng/mL (ref 30.0–100.0)

## 2020-05-20 LAB — PSA: Prostate Specific Ag, Serum: 1.2 ng/mL (ref 0.0–4.0)

## 2020-06-01 ENCOUNTER — Other Ambulatory Visit: Payer: Self-pay | Admitting: Physician Assistant

## 2020-06-01 DIAGNOSIS — E1169 Type 2 diabetes mellitus with other specified complication: Secondary | ICD-10-CM

## 2020-06-01 DIAGNOSIS — E118 Type 2 diabetes mellitus with unspecified complications: Secondary | ICD-10-CM

## 2020-06-08 ENCOUNTER — Encounter: Payer: Self-pay | Admitting: Physician Assistant

## 2020-06-09 ENCOUNTER — Other Ambulatory Visit: Payer: Self-pay | Admitting: Physician Assistant

## 2020-06-09 DIAGNOSIS — E1169 Type 2 diabetes mellitus with other specified complication: Secondary | ICD-10-CM

## 2020-06-09 DIAGNOSIS — I152 Hypertension secondary to endocrine disorders: Secondary | ICD-10-CM

## 2020-06-09 DIAGNOSIS — Z0000 Encounter for general adult medical examination without abnormal findings: Secondary | ICD-10-CM

## 2020-06-09 DIAGNOSIS — E559 Vitamin D deficiency, unspecified: Secondary | ICD-10-CM

## 2020-06-09 DIAGNOSIS — E119 Type 2 diabetes mellitus without complications: Secondary | ICD-10-CM

## 2020-06-09 DIAGNOSIS — E785 Hyperlipidemia, unspecified: Secondary | ICD-10-CM

## 2020-07-07 ENCOUNTER — Other Ambulatory Visit: Payer: Self-pay | Admitting: Physician Assistant

## 2020-07-07 DIAGNOSIS — E1159 Type 2 diabetes mellitus with other circulatory complications: Secondary | ICD-10-CM

## 2020-07-07 DIAGNOSIS — I152 Hypertension secondary to endocrine disorders: Secondary | ICD-10-CM

## 2020-07-10 ENCOUNTER — Encounter: Payer: Self-pay | Admitting: Physician Assistant

## 2020-07-10 ENCOUNTER — Telehealth: Payer: Self-pay | Admitting: Physician Assistant

## 2020-07-10 ENCOUNTER — Ambulatory Visit (INDEPENDENT_AMBULATORY_CARE_PROVIDER_SITE_OTHER): Payer: BC Managed Care – PPO | Admitting: Physician Assistant

## 2020-07-10 VITALS — BP 121/80 | HR 81 | Ht 68.0 in | Wt 213.0 lb

## 2020-07-10 DIAGNOSIS — Z20822 Contact with and (suspected) exposure to covid-19: Secondary | ICD-10-CM

## 2020-07-10 DIAGNOSIS — J029 Acute pharyngitis, unspecified: Secondary | ICD-10-CM

## 2020-07-10 DIAGNOSIS — R059 Cough, unspecified: Secondary | ICD-10-CM | POA: Diagnosis not present

## 2020-07-10 DIAGNOSIS — R0981 Nasal congestion: Secondary | ICD-10-CM | POA: Diagnosis not present

## 2020-07-10 MED ORDER — BENZONATATE 100 MG PO CAPS: 100.0000 mg | ORAL_CAPSULE | Freq: Three times a day (TID) | ORAL | 0 refills | Status: DC | PRN

## 2020-07-10 MED ORDER — PREDNISONE 20 MG PO TABS: ORAL_TABLET | ORAL | 0 refills | Status: DC

## 2020-07-10 NOTE — Progress Notes (Signed)
Telehealth office visit note for Russell Reid, PA-C- at Primary Care at [facility]   I connected with current patient today by telephone and verified that I am speaking with the correct person   . Location of the patient: Home . Location of the provider: Office - This visit type was conducted due to national recommendations for restrictions regarding the COVID-19 Pandemic (e.g. social distancing) in an effort to limit this patient's exposure and mitigate transmission in our community.    - No physical exam could be performed with this format, beyond that communicated to [place] by the patient/ family members as noted.   - Additionally my office staff/ schedulers were to discuss with the patient that there may be a monetary charge related to this service, depending on their medical insurance.  My understanding is that patient understood and consented to proceed.     _________________________________________________________________________________   History of Present Illness: Patient calls in with complaints of scratchy throat: Feeling achy, congested, mild headache and cough.  Denies fever, chills, shortness of breath, ear pain, runny nose, confusion or chest pain. Symptoms started yesterday. Has not had Covid test. Patient's wife tested positive for Covid.  Has received both doses of Pfizer, not due for booster yet.     No flowsheet data found.  Depression screen [facility] 2/9 07/10/2020 05/18/2020 02/13/2020 09/11/2019 09/25/2018  Decreased Interest 0 0 0 0 0  Down, Depressed, Hopeless 0 0 0 0 0  PHQ - 2 Score 0 0 0 0 0  Altered sleeping 0 0 0 0 0  Tired, decreased energy 0 0 0 0 0  Change in appetite 0 0 0 0 0  Feeling bad or failure about yourself  0 0 0 0 0  Trouble concentrating 0 0 0 0 0  Moving slowly or fidgety/restless 0 0 0 0 0  Suicidal thoughts 0 0 0 0 0  PHQ-9 Score 0 0 0 0 0  Difficult doing work/chores - - - - Not difficult at all  Some recent data might be hidden       Impression and Recommendations:     1. Close exposure to COVID-19 virus   2. Sore throat   3. Head congestion   4. Cough     Close exposure to COVID-19 virus, sore throat, head congestion, cough: -Patient has mild symptoms and discussed most likely COVID-19 infection due to close exposure (wife). Recommend to continue with supportive care, stay hydrated, drink warm liquids/honey, use sore throat lozenges, decongestant and take Tylenol as needed for aches/pain relief. Will start corticosteroid therapy and tessalon perles. BP today wnl's.   -Monitor symptoms, especially for shortness of breath, altered mental status, or chest pain and should seek immediate medical care. -Recommend to follow the latest CDC isolation guidelines.    - As part of my medical decision making, I reviewed the following data within the [place] History obtained from pt /family, CMA notes reviewed and incorporated if applicable, Labs reviewed, Radiograph/ tests reviewed if applicable and OV notes from prior OV's with me, as well as any other specialists she/he has seen since seeing me last, were all reviewed and used in my medical decision making process today.    - Additionally, when appropriate, discussion had with patient regarding our treatment plan, and their biases/concerns about that plan were used in my medical decision making today.    - The patient agreed with the plan and demonstrated an understanding of the instructions.  No barriers to understanding were identified.     - The patient was advised to call back or seek an in-person evaluation if the symptoms worsen or if the condition fails to improve as anticipated.   Return if symptoms worsen or fail to improve.    No orders of the defined types were placed in this encounter.   Meds ordered this encounter  Medications  . predniSONE (DELTASONE) 20 MG tablet    Sig: Take 3 tablets by mouth x 2 days, then 2 tablets x 2  days, then 1 tablet x 2 days, then 0.5 tablet x 2 days    Dispense:  15 tablet    Refill:  0    Order Specific Question:   Supervising Provider    Answer:   Beatrice Lecher D [[id number]]  . benzonatate (TESSALON PERLES) 100 MG capsule    Sig: Take 1 capsule (100 mg total) by mouth 3 (three) times daily as needed for cough.    Dispense:  30 capsule    Refill:  0    Order Specific Question:   Supervising Provider    Answer:   Beatrice Lecher D [[id number]]    There are no discontinued medications.     Time spent on visit including pre-visit chart review and post-visit care was 8 minutes.      The [place] was signed into law in 2016 which includes the topic of electronic health records.  This provides immediate access to information in MyChart.  This includes consultation notes, operative notes, office notes, lab results and pathology reports.  If you have any questions about what you read please let us know at your next visit or call us at the office.  We are right here with you.   __________________________________________________________________________________     Patient Care Team    Relationship Specialty Notifications Start End  Russell Robles, [place] PCP - General   10/20/19      -Vitals obtained; medications/ allergies reconciled;  personal medical, social, Sx etc.histories were updated by CMA, reviewed by me and are reflected in chart   Patient Active Problem List   Diagnosis Date Noted  . Strain of long head of biceps 04/27/2020  . Strain of rotator cuff capsule 04/17/2018  . Impingement syndrome of shoulder region, left 04/16/2018  . Bursitis of shoulder, left 04/16/2018  . Cortical age-related cataract of both eyes 02/01/2018  . Dermatochalasis of both upper eyelids 02/01/2018  . Nuclear sclerotic cataract of both eyes 02/01/2018  . Type 2 diabetes mellitus without retinopathy ([place]) 02/01/2018  . new onset Murmur, heart 12/19/2017  . Acute pain of  left shoulder 10/17/2017  . Obesity, Class I, BMI 30-34.9 04/12/2017  . Inactivity 04/12/2017  . Age-related nuclear cataract of both eyes 06/18/2015  . Diabetes mellitus type 2 with complications ([place]) [id number]  . Hypertension associated with diabetes ([place]) 06/18/2015  . Hyperlipidemia associated with type 2 diabetes mellitus ([place]) 06/18/2015  . Presbyopia 06/18/2015  . Inguinal hernia unilateral, non-recurrent 12/13/2010     Current Meds  Medication Sig  . amLODipine-valsartan (EXFORGE) 5-160 MG tablet TAKE 1 TABLET BY MOUTH DAILY. (TAKE WITH HCTZ 12.5 MG TABLET)  . benzonatate (TESSALON PERLES) 100 MG capsule Take 1 capsule (100 mg total) by mouth 3 (three) times daily as needed for cough.  . Cholecalciferol (VITAMIN D3) 125 MCG (5000 UT) TABS 5,000 IU OTC vitamin D3 daily.  Marland Kitchen CINNAMON PO Take 2 capsules by mouth daily.  . Cyanocobalamin (VITAMIN B12  PO) Take 1 tablet by mouth daily.  . hydrochlorothiazide (HYDRODIURIL) 12.5 MG tablet TAKE 1 TABLET BY MOUTH DAILY. (TAKE WITH AMLODIPINE-VALSARTAN)  . JARDIANCE 25 MG TABS tablet TAKE 1 TABLET (25 MG TOTAL) BY MOUTH DAILY.  . metFORMIN (GLUCOPHAGE) 500 MG tablet TAKE 2 TABLETS BY MOUTH IN THE MORNING AND 3 TABLETS IN THE EVENING  . metoprolol succinate (TOPROL-XL) 50 MG 24 hr tablet TAKE 1 TABLET BY MOUTH DAILY. TAKE WITH OR IMMEDIATELY FOLLOWING A MEAL.  . predniSONE (DELTASONE) 20 MG tablet Take 3 tablets by mouth x 2 days, then 2 tablets x 2 days, then 1 tablet x 2 days, then 0.5 tablet x 2 days  . simvastatin (ZOCOR) 80 MG tablet TAKE 1 TABLET BY MOUTH AT BEDTIME.  Marland Kitchen VICTOZA 18 MG/3ML SOPN INJECT 0.3 MLS (1.8 MG TOTAL) INTO THE SKIN DAILY.     Allergies:  No Known Allergies   ROS:  See above HPI for pertinent positives and negatives   Objective:   Blood pressure 121/80, pulse 81, height 5\' 8"  (1.727 m), weight 213 lb (96.6 kg).  (if some vitals are omitted, this means that patient was UNABLE to obtain them. ) General: A  & O * 3; sounds in no acute distress Respiratory: speaking in full sentences, no conversational dyspnea  Psych: insight appears good, mood- appears full

## 2020-07-10 NOTE — Telephone Encounter (Signed)
Patient has telehealth apt today at 1115am and can discuss with provider then. AS, CMA

## 2020-07-13 ENCOUNTER — Encounter: Payer: Self-pay | Admitting: Physician Assistant

## 2020-07-13 NOTE — Patient Instructions (Signed)

## 2020-08-17 ENCOUNTER — Encounter: Payer: Self-pay | Admitting: Physician Assistant

## 2020-08-17 ENCOUNTER — Ambulatory Visit (INDEPENDENT_AMBULATORY_CARE_PROVIDER_SITE_OTHER): Payer: BC Managed Care – PPO | Admitting: Physician Assistant

## 2020-08-17 ENCOUNTER — Ambulatory Visit: Payer: BC Managed Care – PPO | Admitting: Physician Assistant

## 2020-08-17 ENCOUNTER — Other Ambulatory Visit: Payer: Self-pay

## 2020-08-17 VITALS — BP 133/73 | HR 64 | Temp 98.3°F | Ht 68.0 in | Wt 221.6 lb

## 2020-08-17 DIAGNOSIS — I152 Hypertension secondary to endocrine disorders: Secondary | ICD-10-CM | POA: Diagnosis not present

## 2020-08-17 DIAGNOSIS — E118 Type 2 diabetes mellitus with unspecified complications: Secondary | ICD-10-CM | POA: Diagnosis not present

## 2020-08-17 DIAGNOSIS — E1159 Type 2 diabetes mellitus with other circulatory complications: Secondary | ICD-10-CM | POA: Diagnosis not present

## 2020-08-17 DIAGNOSIS — E1169 Type 2 diabetes mellitus with other specified complication: Secondary | ICD-10-CM | POA: Diagnosis not present

## 2020-08-17 DIAGNOSIS — E785 Hyperlipidemia, unspecified: Secondary | ICD-10-CM | POA: Diagnosis not present

## 2020-08-17 LAB — POCT GLYCOSYLATED HEMOGLOBIN (HGB A1C): Hemoglobin A1C: 7.3 % — AB (ref 4.0–5.6)

## 2020-08-17 MED ORDER — EMPAGLIFLOZIN 25 MG PO TABS: 25.0000 mg | ORAL_TABLET | Freq: Every day | ORAL | 1 refills | Status: DC

## 2020-08-17 MED ORDER — VICTOZA 18 MG/3ML ~~LOC~~ SOPN: 1.8000 mg | PEN_INJECTOR | Freq: Every day | SUBCUTANEOUS | 1 refills | Status: DC

## 2020-08-17 NOTE — Progress Notes (Signed)
Established Patient Office Visit  Subjective:  Patient ID: Russell Robles, male    DOB: 1960/06/29  Age: 60 y.o. MRN: [id number]  CC:  Chief Complaint  Patient presents with  . Diabetes  . Hypertension    HPI Russell Robles presents for follow up on diabetes mellitus, hypertension and hyperlipidemia.  Has no acute concerns.  Diabetes: Pt denies increased urination or thirst. Pt reports medication compliance. No hypoglycemic events. Checking glucose at home.  Reports fasting blood sugars vary from 120-130, sometimes in the 200s.  Denies dietary changes, continues to monitor carbohydrates and sweets.  HTN: Pt denies chest pain, palpitations, dizziness or lower extremity swelling. Taking medication as directed without side effects. Checks BP at home and readings range 130/65 with lowest reading 111/65. Pt has increased water intake.  HLD: Pt taking medication as directed without issues. Denies side effects including myalgias and RUQ pain.  States likes to eat red meat.  Planning to be more active with baseball season starting.   Past Medical History:  Diagnosis Date  . Anxiety   . Arthritis   . Cough    PER PT NON-PRODUCTIVE , NO FEVER OR CONGESTION  . Hernia, inguinal, right   . Hyperlipidemia   . Hypertension   . Type 2 diabetes mellitus ([place])    last  HbA1c 7.2 on 05-09-2017 in epic    Past Surgical History:  Procedure Laterality Date  . CARPAL TUNNEL RELEASE Left 08/02/2018   Procedure: CARPAL TUNNEL RELEASE;  Surgeon: Thornton Park, MD;  Location: [facility];  Service: Orthopedics;  Laterality: Left;  . HERNIA REPAIR N/A    Phreesia 05/18/2020  . INGUINAL HERNIA REPAIR Left 12-16-2010  dr Donne Hazel  [facility]  . INGUINAL HERNIA REPAIR Right 06/01/2017   Procedure: OPEN RIGHT INGUINAL HERNIA REPAIR WITH MESH;  Surgeon: Kinsinger, Arta Temeca Somma, MD;  Location: [place];  Service: General;  Laterality: Right;  GENERAL AND TAP BLOCK  . INSERTION OF MESH  Right 06/01/2017   Procedure: INSERTION OF MESH;  Surgeon: Kinsinger, Arta Davelyn Gwinn, MD;  Location: [facility];  Service: General;  Laterality: Right;  GENERAL AND TAP BLOCK    Family History  Problem Relation Age of Onset  . COPD Mother   . Aortic aneurysm Father   . Lung cancer Sister     Social History   Socioeconomic History  . Marital status: Married    Spouse name: Not on file  . Number of children: Not on file  . Years of education: Not on file  . Highest education level: Not on file  Occupational History  . Not on file  Tobacco Use  . Smoking status: Never Smoker  . Smokeless tobacco: Never Used  Vaping Use  . Vaping Use: Never used  Substance and Sexual Activity  . Alcohol use: Yes    Comment: occ  . Drug use: No  . Sexual activity: Yes    Partners: Female    Birth control/protection: None  Other Topics Concern  . Not on file  Social History Narrative  . Not on file   Social Determinants of Health   Financial Resource Strain: Not on file  Food Insecurity: Not on file  Transportation Needs: Not on file  Physical Activity: Not on file  Stress: Not on file  Social Connections: Not on file  Intimate Partner Violence: Not on file    Outpatient Medications Prior to Visit  Medication Sig Dispense Refill  . amLODipine-valsartan (EXFORGE) 5-160  MG tablet TAKE 1 TABLET BY MOUTH DAILY. (TAKE WITH HCTZ 12.5 MG TABLET) 90 tablet 0  . Cholecalciferol (VITAMIN D3) 125 MCG (5000 UT) TABS 5,000 IU OTC vitamin D3 daily. 90 tablet 3  . CINNAMON PO Take 2 capsules by mouth daily.    . Cyanocobalamin (VITAMIN B12 PO) Take 1 tablet by mouth daily.    . hydrochlorothiazide (HYDRODIURIL) 12.5 MG tablet TAKE 1 TABLET BY MOUTH DAILY. (TAKE WITH AMLODIPINE-VALSARTAN) 90 tablet 0  . JARDIANCE 25 MG TABS tablet TAKE 1 TABLET (25 MG TOTAL) BY MOUTH DAILY. 90 tablet 0  . metFORMIN (GLUCOPHAGE) 500 MG tablet TAKE 2 TABLETS BY MOUTH IN THE MORNING AND 3 TABLETS IN THE  EVENING 450 tablet 0  . metoprolol succinate (TOPROL-XL) 50 MG 24 hr tablet TAKE 1 TABLET BY MOUTH DAILY. TAKE WITH OR IMMEDIATELY FOLLOWING A MEAL. 90 tablet 0  . simvastatin (ZOCOR) 80 MG tablet TAKE 1 TABLET BY MOUTH AT BEDTIME. 90 tablet 0  . VICTOZA 18 MG/3ML SOPN INJECT 0.3 MLS (1.8 MG TOTAL) INTO THE SKIN DAILY. 9 mL 1  . benzonatate (TESSALON PERLES) 100 MG capsule Take 1 capsule (100 mg total) by mouth 3 (three) times daily as needed for cough. 30 capsule 0  . predniSONE (DELTASONE) 20 MG tablet Take 3 tablets by mouth x 2 days, then 2 tablets x 2 days, then 1 tablet x 2 days, then 0.5 tablet x 2 days 15 tablet 0   No facility-administered medications prior to visit.    No Known Allergies  ROS Review of Systems A fourteen system review of systems was performed and found to be positive as per HPI.   Objective:    Physical Exam General:  Well Developed, well nourished, appropriate for stated age.  Neuro:  Alert and oriented,  extra-ocular muscles intact  HEENT:  Normocephalic, atraumatic, neck supple Skin:  no gross rash, warm, pink. Cardiac:  RRR, S1 S2 wnl's Respiratory:  ECTA B/L w/o wheezing, Not using accessory muscles, speaking in full sentences- unlabored. Vascular:  Ext warm, no cyanosis apprec.; cap RF less 2 sec. Psych:  No HI/SI, judgement and insight good, Euthymic mood. Full Affect.   BP 133/73   Pulse 64   Temp 98.3 F (36.8 C)   Ht 5\' 8"  (1.727 m)   Wt 221 lb 9.6 oz (100.5 kg)   SpO2 95%   BMI 33.69 kg/m  Wt Readings from Last 3 Encounters:  08/17/20 221 lb 9.6 oz (100.5 kg)  07/10/20 213 lb (96.6 kg)  05/18/20 222 lb 6.4 oz (100.9 kg)     Health Maintenance Due  Topic Date Due  . PNEUMOCOCCAL POLYSACCHARIDE VACCINE AGE 12-64 HIGH RISK  Never done  . COLONOSCOPY (Pts 45-47yrs Insurance coverage will need to be confirmed)  Never done  . INFLUENZA VACCINE  01/19/2020  . OPHTHALMOLOGY EXAM  02/06/2020    There are no preventive care reminders to  display for this patient.  Lab Results  Component Value Date   TSH 1.160 05/19/2020   Lab Results  Component Value Date   WBC 10.9 (H) 05/19/2020   HGB 15.4 05/19/2020   HCT 44.0 05/19/2020   MCV 84 05/19/2020   PLT 293 05/19/2020   Lab Results  Component Value Date   NA 141 05/19/2020   K 4.0 05/19/2020   CO2 23 05/19/2020   GLUCOSE 147 (H) 05/19/2020   BUN 13 05/19/2020   CREATININE 1.13 05/19/2020   BILITOT 0.4 05/19/2020   ALKPHOS 63  05/19/2020   AST 20 05/19/2020   ALT 19 05/19/2020   PROT 7.1 05/19/2020   ALBUMIN 4.6 05/19/2020   CALCIUM 10.1 05/19/2020   ANIONGAP 9 08/01/2018   Lab Results  Component Value Date   CHOL 178 05/19/2020   Lab Results  Component Value Date   HDL 37 (L) 05/19/2020   Lab Results  Component Value Date   LDLCALC 100 (H) 05/19/2020   Lab Results  Component Value Date   TRIG 239 (H) 05/19/2020   Lab Results  Component Value Date   CHOLHDL 4.8 05/19/2020   Lab Results  Component Value Date   HGBA1C 7.3 (A) 08/17/2020      Assessment & Plan:   Problem List Items Addressed This Visit      Cardiovascular and Mediastinum   Hypertension associated with diabetes ([place]) (Chronic)    -Fairly controlled. -Continue current medication regimen. -Follow low-sodium diet and stay well-hydrated. -Will continue to monitor and collect CMP for medication monitoring.      Relevant Orders   Comprehensive metabolic panel   CBC     Endocrine   Diabetes mellitus type 2 with complications ([name]) - Primary (Chronic)    -A1c today 7.4, increased from 7.0.  Patient recently completed corticosteroid taper so likely contributed to A1c elevation so we will repeat A1c in 3 months.  If A1c fails to improve recommend medication adjustments (changing Victoza).  Continue current medication regimen. -Continue ambulatory glucose monitoring. -Continue low carbohydrate and glucose diet. -Will continue to monitor.      Relevant Orders   POCT  glycosylated hemoglobin (Hb A1C) (Completed)   Hyperlipidemia associated with type 2 diabetes mellitus ([name]) (Chronic)    -Last lipid panel: Total cholesterol 178, triglycerides 239, HDL 37, LDL 100 (goal <70) -Continue current medication regimen.  Will repeat lipid panel today.  Discussed with patient changing statin therapy to higher intensity statin such as rosuvastatin if LDL fails to improve.  Patient verbalized understanding. -Recommend to follow a heart healthy diet and reduce saturated and transfats.  Increase physical activity. -Will repeat hepatic function for medication monitoring.      Relevant Orders   Comprehensive metabolic panel   Lipid panel   CBC      No orders of the defined types were placed in this encounter.   Follow-up: Return in about 3 months (around 11/14/2020) for DM, HTN.   Note:  This note was prepared with assistance of Dragon voice recognition software. Occasional wrong-word or sound-a-like substitutions may have occurred due to the inherent limitations of voice recognition software.   Lorrene Reid, PA-C

## 2020-08-17 NOTE — Assessment & Plan Note (Signed)
-  A1c today 7.4, increased from 7.0.  Patient recently completed corticosteroid taper so likely contributed to A1c elevation so we will repeat A1c in 3 months.  If A1c fails to improve recommend medication adjustments (changing Victoza).  Continue current medication regimen. -Continue ambulatory glucose monitoring. -Continue low carbohydrate and glucose diet. -Will continue to monitor.

## 2020-08-17 NOTE — Patient Instructions (Signed)
Diabetes Mellitus and Nutrition, Adult When you have diabetes, or diabetes mellitus, it is very important to have healthy eating habits because your blood sugar (glucose) levels are greatly affected by what you eat and drink. Eating healthy foods in the right amounts, at about the same times every day, can help you:  Control your blood glucose.  Lower your risk of heart disease.  Improve your blood pressure.  Reach or maintain a healthy weight. What can affect my meal plan? Every person with diabetes is different, and each person has different needs for a meal plan. Your health care provider may recommend that you work with a dietitian to make a meal plan that is best for you. Your meal plan may vary depending on factors such as:  The calories you need.  The medicines you take.  Your weight.  Your blood glucose, blood pressure, and cholesterol levels.  Your activity level.  Other health conditions you have, such as heart or kidney disease. How do carbohydrates affect me? Carbohydrates, also called carbs, affect your blood glucose level more than any other type of food. Eating carbs naturally raises the amount of glucose in your blood. Carb counting is a method for keeping track of how many carbs you eat. Counting carbs is important to keep your blood glucose at a healthy level, especially if you use insulin or take certain oral diabetes medicines. It is important to know how many carbs you can safely have in each meal. This is different for every person. Your dietitian can help you calculate how many carbs you should have at each meal and for each snack. How does alcohol affect me? Alcohol can cause a sudden decrease in blood glucose (hypoglycemia), especially if you use insulin or take certain oral diabetes medicines. Hypoglycemia can be a life-threatening condition. Symptoms of hypoglycemia, such as sleepiness, dizziness, and confusion, are similar to symptoms of having too much  alcohol.  Do not drink alcohol if: ? Your health care provider tells you not to drink. ? You are pregnant, may be pregnant, or are planning to become pregnant.  If you drink alcohol: ? Do not drink on an empty stomach. ? Limit how much you use to:  0-1 drink a day for women.  0-2 drinks a day for men. ? Be aware of how much alcohol is in your drink. In the [place] one drink equals one 12 oz bottle of beer (355 mL), one 5 oz glass of wine (148 mL), or one 1 oz glass of hard liquor (44 mL). ? Keep yourself hydrated with water, diet soda, or unsweetened iced tea.  Keep in mind that regular soda, juice, and other mixers may contain a lot of sugar and must be counted as carbs. What are tips for following this plan? Reading food labels  Start by checking the serving size on the "Nutrition Facts" label of packaged foods and drinks. The amount of calories, carbs, fats, and other nutrients listed on the label is based on one serving of the item. Many items contain more than one serving per package.  Check the total grams (g) of carbs in one serving. You can calculate the number of servings of carbs in one serving by dividing the total carbs by 15. For example, if a food has 30 g of total carbs per serving, it would be equal to 2 servings of carbs.  Check the number of grams (g) of saturated fats and trans fats in one serving. Choose foods that have   a low amount or none of these fats.  Check the number of milligrams (mg) of salt (sodium) in one serving. Most people should limit total sodium intake to less than 2,300 mg per day.  Always check the nutrition information of foods labeled as "low-fat" or "nonfat." These foods may be higher in added sugar or refined carbs and should be avoided.  Talk to your dietitian to identify your daily goals for nutrients listed on the label. Shopping  Avoid buying canned, pre-made, or processed foods. These foods tend to be high in fat, sodium, and added  sugar.  Shop around the outside edge of the grocery store. This is where you will most often find fresh fruits and vegetables, bulk grains, fresh meats, and fresh dairy. Cooking  Use low-heat cooking methods, such as baking, instead of high-heat cooking methods like deep frying.  Cook using healthy oils, such as olive, canola, or sunflower oil.  Avoid cooking with butter, cream, or high-fat meats. Meal planning  Eat meals and snacks regularly, preferably at the same times every day. Avoid going long periods of time without eating.  Eat foods that are high in fiber, such as fresh fruits, vegetables, beans, and whole grains. Talk with your dietitian about how many servings of carbs you can eat at each meal.  Eat 4-6 oz (112-168 g) of lean protein each day, such as lean meat, chicken, fish, eggs, or tofu. One ounce (oz) of lean protein is equal to: ? 1 oz (28 g) of meat, chicken, or fish. ? 1 egg. ?  cup (62 g) of tofu.  Eat some foods each day that contain healthy fats, such as avocado, nuts, seeds, and fish.   What foods should I eat? Fruits Berries. Apples. Oranges. Peaches. Apricots. Plums. Grapes. Mango. Papaya. Pomegranate. Kiwi. Cherries. Vegetables Lettuce. Spinach. Leafy greens, including kale, chard, collard greens, and mustard greens. Beets. Cauliflower. Cabbage. Broccoli. Carrots. Green beans. Tomatoes. Peppers. Onions. Cucumbers. Brussels sprouts. Grains Whole grains, such as whole-wheat or whole-grain bread, crackers, tortillas, cereal, and pasta. Unsweetened oatmeal. Quinoa. Brown or wild rice. Meats and other proteins Seafood. Poultry without skin. Lean cuts of poultry and beef. Tofu. Nuts. Seeds. Dairy Low-fat or fat-free dairy products such as milk, yogurt, and cheese. The items listed above may not be a complete list of foods and beverages you can eat. Contact a dietitian for more information. What foods should I avoid? Fruits Fruits canned with  syrup. Vegetables Canned vegetables. Frozen vegetables with butter or cream sauce. Grains Refined white flour and flour products such as bread, pasta, snack foods, and cereals. Avoid all processed foods. Meats and other proteins Fatty cuts of meat. Poultry with skin. Breaded or fried meats. Processed meat. Avoid saturated fats. Dairy Full-fat yogurt, cheese, or milk. Beverages Sweetened drinks, such as soda or iced tea. The items listed above may not be a complete list of foods and beverages you should avoid. Contact a dietitian for more information. Questions to ask a health care provider  Do I need to meet with a diabetes educator?  Do I need to meet with a dietitian?  What number can I call if I have questions?  When are the best times to check my blood glucose? Where to find more information:  American Diabetes Association: diabetes.org  Academy of Nutrition and Dietetics: www.eatright.org  National Institute of Diabetes and Digestive and Kidney Diseases: www.niddk.nih.gov  Association of Diabetes Care and Education Specialists: www.diabeteseducator.org Summary  It is important to have healthy eating   habits because your blood sugar (glucose) levels are greatly affected by what you eat and drink.  A healthy meal plan will help you control your blood glucose and maintain a healthy lifestyle.  Your health care provider may recommend that you work with a dietitian to make a meal plan that is best for you.  Keep in mind that carbohydrates (carbs) and alcohol have immediate effects on your blood glucose levels. It is important to count carbs and to use alcohol carefully. This information is not intended to replace advice given to you by your health care provider. Make sure you discuss any questions you have with your health care provider. Document Revised: 05/14/2019 Document Reviewed: 05/14/2019 Elsevier Patient Education  2021 Elsevier Inc.  

## 2020-08-17 NOTE — Assessment & Plan Note (Signed)
-  Fairly controlled. -Continue current medication regimen. -Follow low-sodium diet and stay well-hydrated. -Will continue to monitor and collect CMP for medication monitoring.

## 2020-08-17 NOTE — Assessment & Plan Note (Signed)
-  Last lipid panel: Total cholesterol 178, triglycerides 239, HDL 37, LDL 100 (goal <70) -Continue current medication regimen.  Will repeat lipid panel today.  Discussed with patient changing statin therapy to higher intensity statin such as rosuvastatin if LDL fails to improve.  Patient verbalized understanding. -Recommend to follow a heart healthy diet and reduce saturated and transfats.  Increase physical activity. -Will repeat hepatic function for medication monitoring.

## 2020-08-18 LAB — LIPID PANEL
Chol/HDL Ratio: 3.8 ratio (ref 0.0–5.0)
Cholesterol, Total: 162 mg/dL (ref 100–199)
HDL: 43 mg/dL (ref >39)
LDL Chol Calc (NIH): 97 mg/dL (ref 0–99)
Triglycerides: 124 mg/dL (ref 0–149)
VLDL Cholesterol Cal: 22 mg/dL (ref 5–40)

## 2020-08-18 LAB — CBC
Hematocrit: 47 % (ref 37.5–51.0)
Hemoglobin: 15.9 g/dL (ref 13.0–17.7)
MCH: 28.6 pg (ref 26.6–33.0)
MCHC: 33.8 g/dL (ref 31.5–35.7)
MCV: 85 fL (ref 79–97)
Platelets: 266 10*3/uL (ref 150–450)
RBC: 5.56 x10E6/uL (ref 4.14–5.80)
RDW: 12.6 % (ref 11.6–15.4)
WBC: 9.6 10*3/uL (ref 3.4–10.8)

## 2020-08-18 LAB — COMPREHENSIVE METABOLIC PANEL
ALT: 28 IU/L (ref 0–44)
AST: 22 IU/L (ref 0–40)
Albumin/Globulin Ratio: 1.7 (ref 1.2–2.2)
Albumin: 4.6 g/dL (ref 3.8–4.9)
Alkaline Phosphatase: 65 IU/L (ref 44–121)
BUN/Creatinine Ratio: 9 (ref 9–20)
BUN: 8 mg/dL (ref 6–24)
Bilirubin Total: 0.5 mg/dL (ref 0.0–1.2)
CO2: 22 mmol/L (ref 20–29)
Calcium: 9.6 mg/dL (ref 8.7–10.2)
Chloride: 99 mmol/L (ref 96–106)
Creatinine, Ser: 0.88 mg/dL (ref 0.76–1.27)
Globulin, Total: 2.7 g/dL (ref 1.5–4.5)
Glucose: 95 mg/dL (ref 65–99)
Potassium: 4.1 mmol/L (ref 3.5–5.2)
Sodium: 137 mmol/L (ref 134–144)
Total Protein: 7.3 g/dL (ref 6.0–8.5)
eGFR: 99 mL/min/{1.73_m2} (ref >59)

## 2020-09-01 ENCOUNTER — Other Ambulatory Visit: Payer: Self-pay | Admitting: Physician Assistant

## 2020-09-01 DIAGNOSIS — E1169 Type 2 diabetes mellitus with other specified complication: Secondary | ICD-10-CM

## 2020-09-01 DIAGNOSIS — E118 Type 2 diabetes mellitus with unspecified complications: Secondary | ICD-10-CM

## 2020-10-15 ENCOUNTER — Other Ambulatory Visit: Payer: Self-pay | Admitting: Physician Assistant

## 2020-10-15 DIAGNOSIS — E1159 Type 2 diabetes mellitus with other circulatory complications: Secondary | ICD-10-CM

## 2020-10-15 DIAGNOSIS — I152 Hypertension secondary to endocrine disorders: Secondary | ICD-10-CM

## 2020-11-17 ENCOUNTER — Other Ambulatory Visit: Payer: Self-pay

## 2020-11-17 ENCOUNTER — Ambulatory Visit: Payer: BC Managed Care – PPO | Admitting: Physician Assistant

## 2020-11-26 ENCOUNTER — Other Ambulatory Visit: Payer: Self-pay | Admitting: Physician Assistant

## 2020-11-26 DIAGNOSIS — E118 Type 2 diabetes mellitus with unspecified complications: Secondary | ICD-10-CM

## 2020-11-26 DIAGNOSIS — E785 Hyperlipidemia, unspecified: Secondary | ICD-10-CM

## 2020-11-26 DIAGNOSIS — E1169 Type 2 diabetes mellitus with other specified complication: Secondary | ICD-10-CM

## 2020-12-14 ENCOUNTER — Encounter: Payer: Self-pay | Admitting: Physician Assistant

## 2020-12-14 ENCOUNTER — Other Ambulatory Visit: Payer: Self-pay

## 2020-12-14 ENCOUNTER — Ambulatory Visit (INDEPENDENT_AMBULATORY_CARE_PROVIDER_SITE_OTHER): Payer: BC Managed Care – PPO | Admitting: Physician Assistant

## 2020-12-14 VITALS — BP 122/68 | HR 58 | Temp 99.4°F | Ht 68.0 in | Wt 214.6 lb

## 2020-12-14 DIAGNOSIS — E1169 Type 2 diabetes mellitus with other specified complication: Secondary | ICD-10-CM | POA: Diagnosis not present

## 2020-12-14 DIAGNOSIS — I152 Hypertension secondary to endocrine disorders: Secondary | ICD-10-CM | POA: Diagnosis not present

## 2020-12-14 DIAGNOSIS — E785 Hyperlipidemia, unspecified: Secondary | ICD-10-CM

## 2020-12-14 DIAGNOSIS — E118 Type 2 diabetes mellitus with unspecified complications: Secondary | ICD-10-CM | POA: Diagnosis not present

## 2020-12-14 DIAGNOSIS — E1159 Type 2 diabetes mellitus with other circulatory complications: Secondary | ICD-10-CM

## 2020-12-14 LAB — POCT GLYCOSYLATED HEMOGLOBIN (HGB A1C): Hemoglobin A1C: 7.7 % — AB (ref 4.0–5.6)

## 2020-12-14 MED ORDER — OZEMPIC (0.25 OR 0.5 MG/DOSE) 2 MG/1.5ML ~~LOC~~ SOPN
PEN_INJECTOR | SUBCUTANEOUS | 3 refills | Status: DC
Start: 2020-12-14 — End: 2021-02-16

## 2020-12-14 MED ORDER — METOPROLOL SUCCINATE ER 50 MG PO TB24
50.0000 mg | ORAL_TABLET | Freq: Every day | ORAL | 1 refills | Status: DC
Start: 2020-12-14 — End: 2021-08-11

## 2020-12-14 NOTE — Assessment & Plan Note (Signed)
-  Patient is fasting, will repeat lipid panel and hepatic function today. -Discussed with patient LDL goal is <70 and if LDL remains above goal recommend to consider treatment adjustments by changing Simvastatin to higher intensity statin rosuvastatin. -Follow a diet low in saturated and trans fats. Continue to stay as active as possible. -Will continue to monitor.

## 2020-12-14 NOTE — Assessment & Plan Note (Addendum)
-  A1c has increased from 7.4 to 7.7, discussed with patient medication adjustments and is agreeable to discontinue Victoza and start Ozempic. Discussed potential side effects and advised to let me know if unable to tolerate medication. GLP-1 agonist will also provide weight loss benefits. Patient verbalized understanding. Continue Metformin and Jardiance. -Continue ambulatory glucose monitoring. -Recommend to continue to stay as active as possible which also provides benefits for stress reduction. -Will continue to monitor.

## 2020-12-14 NOTE — Patient Instructions (Signed)
https://www.nhlbi.nih.gov/files/docs/public/heart/dash_brief.pdf">  DASH Eating Plan DASH stands for Dietary Approaches to Stop Hypertension. The DASH eating plan is a healthy eating plan that has been shown to: Reduce high blood pressure (hypertension). Reduce your risk for type 2 diabetes, heart disease, and stroke. Help with weight loss. What are tips for following this plan? Reading food labels Check food labels for the amount of salt (sodium) per serving. Choose foods with less than 5 percent of the Daily Value of sodium. Generally, foods with less than 300 milligrams (mg) of sodium per serving fit into this eating plan. To find whole grains, look for the word "whole" as the first word in the ingredient list. Shopping Buy products labeled as "low-sodium" or "no salt added." Buy fresh foods. Avoid canned foods and pre-made or frozen meals. Cooking Avoid adding salt when cooking. Use salt-free seasonings or herbs instead of table salt or sea salt. Check with your health care provider or pharmacist before using salt substitutes. Do not fry foods. Cook foods using healthy methods such as baking, boiling, grilling, roasting, and broiling instead. Cook with heart-healthy oils, such as olive, canola, avocado, soybean, or sunflower oil. Meal planning  Eat a balanced diet that includes: 4 or more servings of fruits and 4 or more servings of vegetables each day. Try to fill one-half of your plate with fruits and vegetables. 6-8 servings of whole grains each day. Less than 6 oz (170 g) of lean meat, poultry, or fish each day. A 3-oz (85-g) serving of meat is about the same size as a deck of cards. One egg equals 1 oz (28 g). 2-3 servings of low-fat dairy each day. One serving is 1 cup (237 mL). 1 serving of nuts, seeds, or beans 5 times each week. 2-3 servings of heart-healthy fats. Healthy fats called omega-3 fatty acids are found in foods such as walnuts, flaxseeds, fortified milks, and eggs.  These fats are also found in cold-water fish, such as sardines, salmon, and mackerel. Limit how much you eat of: Canned or prepackaged foods. Food that is high in trans fat, such as some fried foods. Food that is high in saturated fat, such as fatty meat. Desserts and other sweets, sugary drinks, and other foods with added sugar. Full-fat dairy products. Do not salt foods before eating. Do not eat more than 4 egg yolks a week. Try to eat at least 2 vegetarian meals a week. Eat more home-cooked food and less restaurant, buffet, and fast food.  Lifestyle When eating at a restaurant, ask that your food be prepared with less salt or no salt, if possible. If you drink alcohol: Limit how much you use to: 0-1 drink a day for women who are not pregnant. 0-2 drinks a day for men. Be aware of how much alcohol is in your drink. In the [place] one drink equals one 12 oz bottle of beer (355 mL), one 5 oz glass of wine (148 mL), or one 1 oz glass of hard liquor (44 mL). General information Avoid eating more than 2,300 mg of salt a day. If you have hypertension, you may need to reduce your sodium intake to 1,500 mg a day. Work with your health care provider to maintain a healthy body weight or to lose weight. Ask what an ideal weight is for you. Get at least 30 minutes of exercise that causes your heart to beat faster (aerobic exercise) most days of the week. Activities may include walking, swimming, or biking. Work with your health care provider   or dietitian to adjust your eating plan to your individual calorie needs. What foods should I eat? Fruits All fresh, dried, or frozen fruit. Canned fruit in natural juice (without addedsugar). Vegetables Fresh or frozen vegetables (raw, steamed, roasted, or grilled). Low-sodium or reduced-sodium tomato and vegetable juice. Low-sodium or reduced-sodium tomatosauce and tomato paste. Low-sodium or reduced-sodium canned vegetables. Grains Whole-grain or  whole-wheat bread. Whole-grain or whole-wheat pasta. Brown rice. Oatmeal. Quinoa. Bulgur. Whole-grain and low-sodium cereals. Pita bread.Low-fat, low-sodium crackers. Whole-wheat flour tortillas. Meats and other proteins Skinless chicken or turkey. Ground chicken or turkey. Pork with fat trimmed off. Fish and seafood. Egg whites. Dried beans, peas, or lentils. Unsalted nuts, nut butters, and seeds. Unsalted canned beans. Lean cuts of beef with fat trimmed off. Low-sodium, lean precooked or cured meat, such as sausages or meatloaves. Dairy Low-fat (1%) or fat-free (skim) milk. Reduced-fat, low-fat, or fat-free cheeses. Nonfat, low-sodium ricotta or cottage cheese. Low-fat or nonfatyogurt. Low-fat, low-sodium cheese. Fats and oils Soft margarine without trans fats. Vegetable oil. Reduced-fat, low-fat, or light mayonnaise and salad dressings (reduced-sodium). Canola, safflower, olive, avocado, soybean, andsunflower oils. Avocado. Seasonings and condiments Herbs. Spices. Seasoning mixes without salt. Other foods Unsalted popcorn and pretzels. Fat-free sweets. The items listed above may not be a complete list of foods and beverages you can eat. Contact a dietitian for more information. What foods should I avoid? Fruits Canned fruit in a light or heavy syrup. Fried fruit. Fruit in cream or buttersauce. Vegetables Creamed or fried vegetables. Vegetables in a cheese sauce. Regular canned vegetables (not low-sodium or reduced-sodium). Regular canned tomato sauce and paste (not low-sodium or reduced-sodium). Regular tomato and vegetable juice(not low-sodium or reduced-sodium). Pickles. Olives. Grains Baked goods made with fat, such as croissants, muffins, or some breads. Drypasta or rice meal packs. Meats and other proteins Fatty cuts of meat. Ribs. Fried meat. Bacon. Bologna, salami, and other precooked or cured meats, such as sausages or meat loaves. Fat from the back of a pig (fatback). Bratwurst.  Salted nuts and seeds. Canned beans with added salt. Canned orsmoked fish. Whole eggs or egg yolks. Chicken or turkey with skin. Dairy Whole or 2% milk, cream, and half-and-half. Whole or full-fat cream cheese. Whole-fat or sweetened yogurt. Full-fat cheese. Nondairy creamers. Whippedtoppings. Processed cheese and cheese spreads. Fats and oils Butter. Stick margarine. Lard. Shortening. Ghee. Bacon fat. Tropical oils, suchas coconut, palm kernel, or palm oil. Seasonings and condiments Onion salt, garlic salt, seasoned salt, table salt, and sea salt. Worcestershire sauce. Tartar sauce. Barbecue sauce. Teriyaki sauce. Soy sauce, including reduced-sodium. Steak sauce. Canned and packaged gravies. Fish sauce. Oyster sauce. Cocktail sauce. Store-bought horseradish. Ketchup. Mustard. Meat flavorings and tenderizers. Bouillon cubes. Hot sauces. Pre-made or packaged marinades. Pre-made or packaged taco seasonings. Relishes. Regular saladdressings. Other foods Salted popcorn and pretzels. The items listed above may not be a complete list of foods and beverages you should avoid. Contact a dietitian for more information. Where to find more information National Heart, Lung, and Blood Institute: www.nhlbi.nih.gov American Heart Association: www.heart.org Academy of Nutrition and Dietetics: www.eatright.org National Kidney Foundation: www.kidney.org Summary The DASH eating plan is a healthy eating plan that has been shown to reduce high blood pressure (hypertension). It may also reduce your risk for type 2 diabetes, heart disease, and stroke. When on the DASH eating plan, aim to eat more fresh fruits and vegetables, whole grains, lean proteins, low-fat dairy, and heart-healthy fats. With the DASH eating plan, you should limit salt (sodium) intake to 2,300   mg a day. If you have hypertension, you may need to reduce your sodium intake to 1,500 mg a day. Work with your health care provider or dietitian to adjust  your eating plan to your individual calorie needs. This information is not intended to replace advice given to you by your health care provider. Make sure you discuss any questions you have with your healthcare provider. Document Revised: 05/10/2019 Document Reviewed: 05/10/2019 Elsevier Patient Education  2022 Elsevier Inc.  

## 2020-12-14 NOTE — Progress Notes (Signed)
Established Patient Office Visit  Subjective:  Patient ID: Russell Robles, male    DOB: [birth year]  Age: 60 y.o. MRN: [id number]  CC:  Chief Complaint  Patient presents with   Follow-up   Diabetes   Hyperlipidemia   Hypertension     HPI [name] presents for follow up on diabetes mellitus, hypertension and hyperlipidemia.   Diabetes: Pt denies increased urination or thirst. Pt reports medication compliance. No hypoglycemic events. Checking glucose at home. States has noticed fasting sugars being higher than normal and do fluctuate, range from 120 to 200. Has not changed his diet, denies increasing carbohydrate or sugar intake. Does report increased personal stress.  HTN: Pt denies chest pain, palpitations, dizziness or leg swelling. Taking medication as directed without side effects. Checks BP at home  and readings average <130/85. Pt follows a low salt diet. Continues to work on hydration.  HLD: Pt taking medication as directed without issues. Denies side effects including myalgias. States has been active with baseball.   Past Medical History:  Diagnosis Date   Anxiety    Arthritis    Cough    PER PT NON-PRODUCTIVE , NO FEVER OR CONGESTION   Hernia, inguinal, right    Hyperlipidemia    Hypertension    Type 2 diabetes mellitus ([place])    last  HbA1c 7.2 on 05-09-2017 in epic    Past Surgical History:  Procedure Laterality Date   CARPAL TUNNEL RELEASE Left 08/02/2018   Procedure: CARPAL TUNNEL RELEASE;  Surgeon: Thornton Park, MD;  Location: [facility];  Service: Orthopedics;  Laterality: Left;   HERNIA REPAIR N/A    Phreesia 05/18/2020   INGUINAL HERNIA REPAIR Left 12-16-2010  dr Donne Hazel  [place] Right 06/01/2017   Procedure: OPEN RIGHT INGUINAL HERNIA REPAIR WITH MESH;  Surgeon: Kinsinger, Arta Bruce, MD;  Location: [place];  Service: General;  Laterality: Right;  GENERAL AND TAP BLOCK   INSERTION OF MESH Right  06/01/2017   Procedure: INSERTION OF MESH;  Surgeon: Kinsinger, Arta Bruce, MD;  Location: [place];  Service: General;  Laterality: Right;  GENERAL AND TAP BLOCK    Family History  Problem Relation Age of Onset   COPD Mother    Aortic aneurysm Father    Lung cancer Sister     Social History   Socioeconomic History   Marital status: Married    Spouse name: Not on file   Number of children: Not on file   Years of education: Not on file   Highest education level: Not on file  Occupational History   Not on file  Tobacco Use   Smoking status: Never   Smokeless tobacco: Never  Vaping Use   Vaping Use: Never used  Substance and Sexual Activity   Alcohol use: Yes    Comment: occ   Drug use: No   Sexual activity: Yes    Partners: Female    Birth control/protection: None  Other Topics Concern   Not on file  Social History Narrative   Not on file   Social Determinants of Health   Financial Resource Strain: Not on file  Food Insecurity: Not on file  Transportation Needs: Not on file  Physical Activity: Not on file  Stress: Not on file  Social Connections: Not on file  Intimate Partner Violence: Not on file    Outpatient Medications Prior to Visit  Medication Sig Dispense Refill   amLODipine-valsartan (EXFORGE) 5-160  MG tablet TAKE 1 TABLET BY MOUTH DAILY. (TAKE WITH HCTZ 12.5 MG TABLET) 90 tablet 0   Cholecalciferol (VITAMIN D3) 125 MCG (5000 UT) TABS 5,000 IU OTC vitamin D3 daily. 90 tablet 3   CINNAMON PO Take 2 capsules by mouth daily.     Cyanocobalamin (VITAMIN B12 PO) Take 1 tablet by mouth daily.     empagliflozin (JARDIANCE) 25 MG TABS tablet Take 1 tablet (25 mg total) by mouth daily. 90 tablet 1   hydrochlorothiazide (HYDRODIURIL) 12.5 MG tablet TAKE 1 TABLET BY MOUTH DAILY. (TAKE WITH AMLODIPINE-VALSARTAN) 90 tablet 0   metFORMIN (GLUCOPHAGE) 500 MG tablet TAKE 2 TABLETS BY MOUTH IN THE MORNING AND 3 TABLETS IN THE EVENING 450 tablet 0    simvastatin (ZOCOR) 80 MG tablet TAKE 1 TABLET BY MOUTH AT BEDTIME. 90 tablet 0   metoprolol succinate (TOPROL-XL) 50 MG 24 hr tablet TAKE 1 TABLET BY MOUTH DAILY. TAKE WITH OR IMMEDIATELY FOLLOWING A MEAL. 90 tablet 0   VICTOZA 18 MG/3ML SOPN INJECT 1.8 MG INTO THE SKIN DAILY. 9 mL 1   No facility-administered medications prior to visit.    No Known Allergies  ROS Review of Systems A fourteen system review of systems was performed and found to be positive as per HPI.   Objective:    Physical Exam General:  Well Developed, well nourished, appropriate for stated age.  Neuro:  Alert and oriented,  extra-ocular muscles intact  HEENT:  Normocephalic, atraumatic, neck supple Skin:  no gross rash, warm, pink. Cardiac:  RRR, S1 S2, +murmur Respiratory:  ECTA B/L, Not using accessory muscles, speaking in full sentences- unlabored. Vascular:  Ext warm, no cyanosis apprec.; cap RF less 2 sec. No edema  Psych:  No HI/SI, judgement and insight good, Euthymic mood. Full Affect.  BP 122/68   Pulse (!) 58   Temp 99.4 F (37.4 C)   Ht _0  (1.727 m)   Wt 214 lb 9.6 oz (97.3 kg)   SpO2 95%   BMI 32.63 kg/m  Wt Readings from Last 3 Encounters:  12/14/20 214 lb 9.6 oz (97.3 kg)  08/17/20 221 lb 9.6 oz (100.5 kg)  07/10/20 213 lb (96.6 kg)     Health Maintenance Due  Topic Date Due   PNEUMOCOCCAL POLYSACCHARIDE VACCINE AGE 65-64 HIGH RISK  Never done   COLONOSCOPY (Pts 45-81yr Insurance coverage will need to be confirmed)  Never done   Zoster Vaccines- Shingrix (1 of 2) Never done   OPHTHALMOLOGY EXAM  02/06/2020   COVID-19 Vaccine (3 - Booster for [place]) 09/04/2020    There are no preventive care reminders to display for this patient.  Lab Results  Component Value Date   TSH 1.160 05/19/2020   Lab Results  Component Value Date   WBC 9.6 08/17/2020   HGB 15.9 08/17/2020   HCT 47.0 08/17/2020   MCV 85 08/17/2020   PLT 266 08/17/2020   Lab Results  Component Value  Date   NA 137 08/17/2020   K 4.1 08/17/2020   CO2 22 08/17/2020   GLUCOSE 95 08/17/2020   BUN 8 08/17/2020   CREATININE 0.88 08/17/2020   BILITOT 0.5 08/17/2020   ALKPHOS 65 08/17/2020   AST 22 08/17/2020   ALT 28 08/17/2020   PROT 7.3 08/17/2020   ALBUMIN 4.6 08/17/2020   CALCIUM 9.6 08/17/2020   ANIONGAP 9 08/01/2018   EGFR 99 08/17/2020   Lab Results  Component Value Date   CHOL 162 08/17/2020  Lab Results  Component Value Date   HDL 43 08/17/2020   Lab Results  Component Value Date   LDLCALC 97 08/17/2020   Lab Results  Component Value Date   TRIG 124 08/17/2020   Lab Results  Component Value Date   CHOLHDL 3.8 08/17/2020   Lab Results  Component Value Date   HGBA1C 7.7 (A) 12/14/2020      Assessment & Plan:   Problem List Items Addressed This Visit       Cardiovascular and Mediastinum   Hypertension associated with diabetes ([place]) (Chronic)    -Controlled. -Continue current medication regimen. Will collect CMP for medication monitoring. -Continue low sodium diet and recommend to stay well hydrated. -Will continue to monitor.       Relevant Medications   Semaglutide,0.25 or 0.5MG/DOS, (OZEMPIC, 0.25 OR 0.5 MG/DOSE,) 2 MG/1.5ML SOPN   metoprolol succinate (TOPROL-XL) 50 MG 24 hr tablet   Other Relevant Orders   Comp Met (CMET)   Lipid Profile   CBC w/Diff     Endocrine   Diabetes mellitus type 2 with complications ([name]) - Primary (Chronic)    -A1c has increased from 7.4 to 7.7, discussed with patient medication adjustments and is agreeable to discontinue Victoza and start Ozempic. Discussed potential side effects and advised to let me know if unable to tolerate medication. GLP-1 agonist will also provide weight loss benefits. Patient verbalized understanding. Continue Metformin and Jardiance. -Continue ambulatory glucose monitoring. -Recommend to continue to stay as active as possible which also provides benefits for stress reduction. -Will  continue to monitor.       Relevant Medications   Semaglutide,0.25 or 0.5MG/DOS, (OZEMPIC, 0.25 OR 0.5 MG/DOSE,) 2 MG/1.5ML SOPN   Other Relevant Orders   POCT HgB A1C (Completed)   Comp Met (CMET)   CBC w/Diff   Hyperlipidemia associated with type 2 diabetes mellitus ([name]) (Chronic)    -Patient is fasting, will repeat lipid panel and hepatic function today. -Discussed with patient LDL goal is <70 and if LDL remains above goal recommend to consider treatment adjustments by changing Simvastatin to higher intensity statin rosuvastatin. -Follow a diet low in saturated and trans fats. Continue to stay as active as possible. -Will continue to monitor.       Relevant Medications   Semaglutide,0.25 or 0.5MG/DOS, (OZEMPIC, 0.25 OR 0.5 MG/DOSE,) 2 MG/1.5ML SOPN   Other Relevant Orders   CBC w/Diff    Meds ordered this encounter  Medications   Semaglutide,0.25 or 0.5MG/DOS, (OZEMPIC, 0.25 OR 0.5 MG/DOSE,) 2 MG/1.5ML SOPN    Sig: Inject 0.25 mg into the skin once weekly x 4 weeks. Then inject 0.5 mg into the skin once weekly.    Dispense:  3 mL    Refill:  3    Order Specific Question:   Supervising Provider    Answer:   Beatrice Lecher D [[id number]]   metoprolol succinate (TOPROL-XL) 50 MG 24 hr tablet    Sig: Take 1 tablet (50 mg total) by mouth daily. TAKE WITH OR IMMEDIATELY FOLLOWING A MEAL.    Dispense:  90 tablet    Refill:  1    Order Specific Question:   Supervising Provider    Answer:   Beatrice Lecher D [[id number]]     Follow-up: Return in about 3 months (around 03/16/2021) for DM-changed med, HTN, HLD.   Note:  This note was prepared with assistance of Dragon voice recognition software. Occasional wrong-word or sound-a-like substitutions may have occurred due to the inherent limitations of  voice recognition software.   Lorrene Reid, PA-C

## 2020-12-14 NOTE — Assessment & Plan Note (Signed)
-  Controlled. -Continue current medication regimen. Will collect CMP for medication monitoring. -Continue low sodium diet and recommend to stay well hydrated. -Will continue to monitor.

## 2020-12-15 LAB — COMPREHENSIVE METABOLIC PANEL
ALT: 17 IU/L (ref 0–44)
AST: 17 IU/L (ref 0–40)
Albumin/Globulin Ratio: 1.7 (ref 1.2–2.2)
Albumin: 4.5 g/dL (ref 3.8–4.9)
Alkaline Phosphatase: 68 IU/L (ref 44–121)
BUN/Creatinine Ratio: 13 (ref 9–20)
BUN: 12 mg/dL (ref 6–24)
Bilirubin Total: 0.7 mg/dL (ref 0.0–1.2)
CO2: 21 mmol/L (ref 20–29)
Calcium: 9.5 mg/dL (ref 8.7–10.2)
Chloride: 99 mmol/L (ref 96–106)
Creatinine, Ser: 0.93 mg/dL (ref 0.76–1.27)
Globulin, Total: 2.6 g/dL (ref 1.5–4.5)
Glucose: 237 mg/dL — ABNORMAL HIGH (ref 65–99)
Potassium: 4 mmol/L (ref 3.5–5.2)
Sodium: 137 mmol/L (ref 134–144)
Total Protein: 7.1 g/dL (ref 6.0–8.5)
eGFR: 95 mL/min/{1.73_m2} (ref >59)

## 2020-12-15 LAB — LIPID PANEL
Chol/HDL Ratio: 3.4 ratio (ref 0.0–5.0)
Cholesterol, Total: 125 mg/dL (ref 100–199)
HDL: 37 mg/dL — ABNORMAL LOW (ref >39)
LDL Chol Calc (NIH): 67 mg/dL (ref 0–99)
Triglycerides: 118 mg/dL (ref 0–149)
VLDL Cholesterol Cal: 21 mg/dL (ref 5–40)

## 2020-12-15 LAB — CBC WITH DIFFERENTIAL/PLATELET
Basophils Absolute: 0.1 10*3/uL (ref 0.0–0.2)
Basos: 1 %
EOS (ABSOLUTE): 0.5 10*3/uL — ABNORMAL HIGH (ref 0.0–0.4)
Eos: 6 %
Hematocrit: 39.1 % (ref 37.5–51.0)
Hemoglobin: 13.5 g/dL (ref 13.0–17.7)
Immature Grans (Abs): 0 10*3/uL (ref 0.0–0.1)
Immature Granulocytes: 0 %
Lymphocytes Absolute: 2.9 10*3/uL (ref 0.7–3.1)
Lymphs: 37 %
MCH: 28.9 pg (ref 26.6–33.0)
MCHC: 34.5 g/dL (ref 31.5–35.7)
MCV: 84 fL (ref 79–97)
Monocytes Absolute: 1 10*3/uL — ABNORMAL HIGH (ref 0.1–0.9)
Monocytes: 13 %
Neutrophils Absolute: 3.4 10*3/uL (ref 1.4–7.0)
Neutrophils: 43 %
Platelets: 238 10*3/uL (ref 150–450)
RBC: 4.67 x10E6/uL (ref 4.14–5.80)
RDW: 12.5 % (ref 11.6–15.4)
WBC: 7.9 10*3/uL (ref 3.4–10.8)

## 2020-12-15 NOTE — Progress Notes (Signed)
Patient is aware of the results and verbalized understanding. AS, CMA 

## 2020-12-16 ENCOUNTER — Other Ambulatory Visit: Payer: Self-pay | Admitting: Physician Assistant

## 2020-12-16 DIAGNOSIS — E1169 Type 2 diabetes mellitus with other specified complication: Secondary | ICD-10-CM

## 2020-12-16 DIAGNOSIS — E785 Hyperlipidemia, unspecified: Secondary | ICD-10-CM

## 2020-12-16 DIAGNOSIS — E118 Type 2 diabetes mellitus with unspecified complications: Secondary | ICD-10-CM

## 2021-02-16 ENCOUNTER — Other Ambulatory Visit: Payer: Self-pay | Admitting: Physician Assistant

## 2021-02-16 DIAGNOSIS — E118 Type 2 diabetes mellitus with unspecified complications: Secondary | ICD-10-CM

## 2021-02-17 ENCOUNTER — Ambulatory Visit (INDEPENDENT_AMBULATORY_CARE_PROVIDER_SITE_OTHER): Payer: BC Managed Care – PPO | Admitting: Nurse Practitioner

## 2021-02-17 ENCOUNTER — Encounter: Payer: Self-pay | Admitting: Nurse Practitioner

## 2021-02-17 ENCOUNTER — Other Ambulatory Visit: Payer: Self-pay

## 2021-02-17 VITALS — BP 110/66 | HR 56 | Temp 97.0°F | Ht 68.0 in | Wt 209.0 lb

## 2021-02-17 DIAGNOSIS — H60541 Acute eczematoid otitis externa, right ear: Secondary | ICD-10-CM

## 2021-02-17 DIAGNOSIS — R42 Dizziness and giddiness: Secondary | ICD-10-CM | POA: Diagnosis not present

## 2021-02-17 MED ORDER — HYDROCORTISONE-ACETIC ACID 1-2 % OT SOLN: 4.0000 [drp] | Freq: Two times a day (BID) | OTIC | 0 refills | Status: DC

## 2021-02-17 MED ORDER — MECLIZINE HCL 12.5 MG PO TABS: 12.5000 mg | ORAL_TABLET | Freq: Two times a day (BID) | ORAL | 0 refills | Status: DC | PRN

## 2021-02-17 NOTE — Progress Notes (Signed)
Acute Office Visit  Subjective:    Patient ID: Russell Robles, male    DOB: 1961/01/19, 60 y.o.   MRN: [id number]  Chief Complaint  Patient presents with   Dizziness    HPI Patient is in today for evaluation of dizziness.  States that he has had 4-5 episodes of dizziness since yesterday.  First episode caused him to have nausea and one episode of vomiting.  States that today things have been a little better.  Denies nausea and vomiting today.  He denies fever, headache, or chills.  He denies fatigue.  States that within the past 2 weeks he has taken 4 COVID-19 tests and all have been negative.  He denies chest pain, chest pressure, or shortness of breath.  Past Medical History:  Diagnosis Date   Anxiety    Arthritis    Cough    PER PT NON-PRODUCTIVE , NO FEVER OR CONGESTION   Hernia, inguinal, right    Hyperlipidemia    Hypertension    Type 2 diabetes mellitus ([place])    last  HbA1c 7.2 on 05-09-2017 in epic    Past Surgical History:  Procedure Laterality Date   CARPAL TUNNEL RELEASE Left 08/02/2018   Procedure: CARPAL TUNNEL RELEASE;  Surgeon: Thornton Park, MD;  Location: [facility];  Service: Orthopedics;  Laterality: Left;   HERNIA REPAIR N/A    Phreesia 05/18/2020   INGUINAL HERNIA REPAIR Left 12-16-2010  dr Donne Hazel  [place] Right 06/01/2017   Procedure: OPEN RIGHT INGUINAL HERNIA REPAIR WITH MESH;  Surgeon: Kinsinger, Arta Bruce, MD;  Location: [place];  Service: General;  Laterality: Right;  GENERAL AND TAP BLOCK   INSERTION OF MESH Right 06/01/2017   Procedure: INSERTION OF MESH;  Surgeon: Kinsinger, Arta Bruce, MD;  Location: [place];  Service: General;  Laterality: Right;  GENERAL AND TAP BLOCK    Family History  Problem Relation Age of Onset   COPD Mother    Aortic aneurysm Father    Lung cancer Sister     Social History   Socioeconomic History   Marital status: Married    Spouse name: Not  on file   Number of children: Not on file   Years of education: Not on file   Highest education level: Not on file  Occupational History   Not on file  Tobacco Use   Smoking status: Never   Smokeless tobacco: Never  Vaping Use   Vaping Use: Never used  Substance and Sexual Activity   Alcohol use: Yes    Comment: occ   Drug use: No   Sexual activity: Yes    Partners: Female    Birth control/protection: None  Other Topics Concern   Not on file  Social History Narrative   Not on file   Social Determinants of Health   Financial Resource Strain: Not on file  Food Insecurity: Not on file  Transportation Needs: Not on file  Physical Activity: Not on file  Stress: Not on file  Social Connections: Not on file  Intimate Partner Violence: Not on file    Outpatient Medications Prior to Visit  Medication Sig Dispense Refill   amLODipine-valsartan (EXFORGE) 5-160 MG tablet TAKE 1 TABLET BY MOUTH DAILY. (TAKE WITH HCTZ 12.5 MG TABLET) 90 tablet 0   Cholecalciferol (VITAMIN D3) 125 MCG (5000 UT) TABS 5,000 IU OTC vitamin D3 daily. 90 tablet 3   CINNAMON PO Take 2 capsules by mouth daily.  Cyanocobalamin (VITAMIN B12 PO) Take 1 tablet by mouth daily.     empagliflozin (JARDIANCE) 25 MG TABS tablet Take 1 tablet (25 mg total) by mouth daily. 90 tablet 1   hydrochlorothiazide (HYDRODIURIL) 12.5 MG tablet TAKE 1 TABLET BY MOUTH DAILY. (TAKE WITH AMLODIPINE-VALSARTAN) 90 tablet 0   metFORMIN (GLUCOPHAGE) 500 MG tablet TAKE 2 TABLETS BY MOUTH IN THE MORNING AND 3 TABLETS IN THE EVENING 450 tablet 0   metoprolol succinate (TOPROL-XL) 50 MG 24 hr tablet Take 1 tablet (50 mg total) by mouth daily. TAKE WITH OR IMMEDIATELY FOLLOWING A MEAL. 90 tablet 1   Semaglutide,0.25 or 0.5MG/DOS, (OZEMPIC, 0.25 OR 0.5 MG/DOSE,) 2 MG/1.5ML SOPN INJECT 0.25 MG INTO THE SKIN ONCE WEEKLY FOR FOUR WEEKS. THEN INJECT 0.5 MG INTO THE SKIN ONCE WEEKLY 8 mL 0   simvastatin (ZOCOR) 80 MG tablet TAKE 1 TABLET BY  MOUTH AT BEDTIME. 90 tablet 0   No facility-administered medications prior to visit.    No Known Allergies  Review of Systems  Constitutional:  Negative for activity change, chills, fatigue and fever.  HENT:  Positive for ear pain. Negative for congestion, postnasal drip, rhinorrhea, sinus pressure, sinus pain, sneezing and sore throat.   Eyes: Negative.   Respiratory:  Negative for cough, shortness of breath and wheezing.   Cardiovascular:  Negative for chest pain and palpitations.  Gastrointestinal:  Positive for nausea and vomiting. Negative for constipation and diarrhea.       Single episode of nausea and vomiting yesterday with first episode of dizziness.  No further episodes since then.  Endocrine: Negative for cold intolerance, heat intolerance, polydipsia and polyuria.  Genitourinary:  Negative for dysuria, frequency and urgency.  Musculoskeletal:  Negative for back pain and myalgias.  Skin:  Negative for rash.  Allergic/Immunologic: Positive for environmental allergies.  Neurological:  Positive for dizziness. Negative for weakness and headaches.  Psychiatric/Behavioral:  The patient is not nervous/anxious.       Objective:    Physical Exam Vitals and nursing note reviewed.  Constitutional:      Appearance: Normal appearance. He is well-developed.  HENT:     Head: Normocephalic.     Right Ear: Swelling present. Tympanic membrane is erythematous and bulging.     Left Ear: Hearing, tympanic membrane, ear canal and external ear normal.     Nose: No congestion or rhinorrhea.     Right Sinus: No maxillary sinus tenderness or frontal sinus tenderness.     Left Sinus: No maxillary sinus tenderness or frontal sinus tenderness.  Eyes:     Pupils: Pupils are equal, round, and reactive to light.  Cardiovascular:     Rate and Rhythm: Normal rate and regular rhythm.     Pulses: Normal pulses.     Heart sounds: Normal heart sounds.  Pulmonary:     Effort: Pulmonary effort is  normal.     Breath sounds: Normal breath sounds.  Abdominal:     Palpations: Abdomen is soft.  Musculoskeletal:        General: Normal range of motion.     Cervical back: Normal range of motion and neck supple.  Lymphadenopathy:     Cervical: No cervical adenopathy.  Skin:    General: Skin is warm and dry.     Capillary Refill: Capillary refill takes less than 2 seconds.  Neurological:     General: No focal deficit present.     Mental Status: He is alert and oriented to person, place, and time.  Psychiatric:        Mood and Affect: Mood normal.        Behavior: Behavior normal.        Thought Content: Thought content normal.        Judgment: Judgment normal.   Today's Vitals   02/17/21 1313  BP: 110/66  Pulse: (!) 56  Temp: (!) 97 F (36.1 C)  SpO2: 98%  Weight: 209 lb (94.8 kg)  Height: _0  (1.727 m)   Body mass index is 31.78 kg/m.   Wt Readings from Last 3 Encounters:  02/17/21 209 lb (94.8 kg)  12/14/20 214 lb 9.6 oz (97.3 kg)  08/17/20 221 lb 9.6 oz (100.5 kg)    Health Maintenance Due  Topic Date Due   PNEUMOCOCCAL POLYSACCHARIDE VACCINE AGE 61-64 HIGH RISK  Never done   COLONOSCOPY (Pts 45-61yr Insurance coverage will need to be confirmed)  Never done   Zoster Vaccines- Shingrix (1 of 2) Never done   OPHTHALMOLOGY EXAM  02/06/2020   COVID-19 Vaccine (3 - Booster for Pfizer series) 09/04/2020   INFLUENZA VACCINE  01/18/2021   FOOT EXAM  02/12/2021    There are no preventive care reminders to display for this patient.   Lab Results  Component Value Date   TSH 1.160 05/19/2020   Lab Results  Component Value Date   WBC 7.9 12/14/2020   HGB 13.5 12/14/2020   HCT 39.1 12/14/2020   MCV 84 12/14/2020   PLT 238 12/14/2020   Lab Results  Component Value Date   NA 137 12/14/2020   K 4.0 12/14/2020   CO2 21 12/14/2020   GLUCOSE 237 (H) 12/14/2020   BUN 12 12/14/2020   CREATININE 0.93 12/14/2020   BILITOT 0.7 12/14/2020   ALKPHOS 68 12/14/2020    AST 17 12/14/2020   ALT 17 12/14/2020   PROT 7.1 12/14/2020   ALBUMIN 4.5 12/14/2020   CALCIUM 9.5 12/14/2020   ANIONGAP 9 08/01/2018   EGFR 95 12/14/2020   Lab Results  Component Value Date   CHOL 125 12/14/2020   Lab Results  Component Value Date   HDL 37 (L) 12/14/2020   Lab Results  Component Value Date   LDLCALC 67 12/14/2020   Lab Results  Component Value Date   TRIG 118 12/14/2020   Lab Results  Component Value Date   CHOLHDL 3.4 12/14/2020   Lab Results  Component Value Date   HGBA1C 7.7 (A) 12/14/2020       Assessment & Plan:  1. Acute eczematoid otitis externa of right ear Will start hydrocortisone/acetic acid eardrops.  Patient placed 4 drops into the right ear twice daily for the next 5 to 7 days.  He can then use as indicated and as needed.  He should rest and increase fluid intake. - acetic acid-hydrocortisone (VOSOL-HC) OTIC solution; Place 4 drops into the right ear 2 (two) times daily.  Dispense: 10 mL; Refill: 0  2. Vertigo Short-term prescription for Antivert 12.5 mg tablets.  This may be taken up to twice daily as needed for dizziness.  He should rest and increase fluids. - meclizine (ANTIVERT) 12.5 MG tablet; Take 1 tablet (12.5 mg total) by mouth 2 (two) times daily as needed for dizziness.  Dispense: 30 tablet; Refill: 0   Problem List Items Addressed This Visit       Nervous and Auditory   Acute eczematoid otitis externa of right ear - Primary   Relevant Medications   acetic acid-hydrocortisone (VOSOL-HC) OTIC solution  Other   Vertigo   Relevant Medications   meclizine (ANTIVERT) 12.5 MG tablet     Meds ordered this encounter  Medications   acetic acid-hydrocortisone (VOSOL-HC) OTIC solution    Sig: Place 4 drops into the right ear 2 (two) times daily.    Dispense:  10 mL    Refill:  0    Order Specific Question:   Supervising Provider    Answer:   Hali Marry [[id number]]   meclizine (ANTIVERT) 12.5 MG tablet    Sig:  Take 1 tablet (12.5 mg total) by mouth 2 (two) times daily as needed for dizziness.    Dispense:  30 tablet    Refill:  0    Order Specific Question:   Supervising Provider    Answer:   Beatrice Lecher D [[id number]]   This note was dictated using Dragon Voice Recognition Software. Rapid proofreading was performed to expedite the delivery of the information. Despite proofreading, phonetic errors will occur which are common with this voice recognition software. Please take this into consideration. If there are any concerns, please contact our office.     Ronnell Freshwater, NP

## 2021-02-28 DIAGNOSIS — H60541 Acute eczematoid otitis externa, right ear: Secondary | ICD-10-CM | POA: Insufficient documentation

## 2021-02-28 DIAGNOSIS — R42 Dizziness and giddiness: Secondary | ICD-10-CM | POA: Insufficient documentation

## 2021-03-16 ENCOUNTER — Encounter: Payer: Self-pay | Admitting: Physician Assistant

## 2021-03-16 ENCOUNTER — Ambulatory Visit: Payer: BC Managed Care – PPO | Admitting: Physician Assistant

## 2021-03-16 VITALS — BP 118/76 | HR 52 | Temp 97.6°F | Ht 68.0 in | Wt 211.0 lb

## 2021-03-16 DIAGNOSIS — E1169 Type 2 diabetes mellitus with other specified complication: Secondary | ICD-10-CM

## 2021-03-16 DIAGNOSIS — I152 Hypertension secondary to endocrine disorders: Secondary | ICD-10-CM

## 2021-03-16 DIAGNOSIS — E669 Obesity, unspecified: Secondary | ICD-10-CM | POA: Diagnosis not present

## 2021-03-16 DIAGNOSIS — E1159 Type 2 diabetes mellitus with other circulatory complications: Secondary | ICD-10-CM | POA: Diagnosis not present

## 2021-03-16 DIAGNOSIS — E785 Hyperlipidemia, unspecified: Secondary | ICD-10-CM

## 2021-03-16 DIAGNOSIS — E118 Type 2 diabetes mellitus with unspecified complications: Secondary | ICD-10-CM | POA: Diagnosis not present

## 2021-03-16 LAB — POCT GLYCOSYLATED HEMOGLOBIN (HGB A1C): Hemoglobin A1C: 6.9 % — AB (ref 4.0–5.6)

## 2021-03-16 NOTE — Assessment & Plan Note (Signed)
-  Controlled. Continue current medication regimen. Will continue to monitor. 

## 2021-03-16 NOTE — Progress Notes (Signed)
Established Patient Office Visit  Subjective:  Patient ID: Russell Robles, male    DOB: 1960/12/05  Age: 60 y.o. MRN: [id number]  CC:  Chief Complaint  Patient presents with   Follow-up   Diabetes   Hypertension   Hyperlipidemia    HPI Russell Robles presents for follow up on diabetes mellitus, hypertension and hyperlipidemia.   Diabetes: Pt denies increased urination or thirst. Pt reports medication compliance. Tolerating Ozempic 0.5 mg without issues. No hypoglycemic events. Checking glucose at home. FBS range 120-130s.  HTN: Pt denies chest pain, palpitations, dizziness or leg swelling. Taking medication as directed without side effects. Has not checked BP at home. Pt follows a low salt diet.  HLD: Pt taking medication as directed without issues. Denies side effects including myalgias and RUQ pain. Tries to have a balanced diet.  Weight: Reports working on weight loss, wants to get to less than 200 pounds. Reports has reduced calorie intake.    Past Medical History:  Diagnosis Date   Anxiety    Arthritis    Cough    PER PT NON-PRODUCTIVE , NO FEVER OR CONGESTION   Hernia, inguinal, right    Hyperlipidemia    Hypertension    Type 2 diabetes mellitus ([place])    last  HbA1c 7.2 on 05-09-2017 in epic    Past Surgical History:  Procedure Laterality Date   CARPAL TUNNEL RELEASE Left 08/02/2018   Procedure: CARPAL TUNNEL RELEASE;  Surgeon: Thornton Park, MD;  Location: [facility];  Service: Orthopedics;  Laterality: Left;   HERNIA REPAIR N/A    Phreesia 05/18/2020   INGUINAL HERNIA REPAIR Left 12-16-2010  dr Donne Hazel  [place] Right 06/01/2017   Procedure: OPEN RIGHT INGUINAL HERNIA REPAIR WITH MESH;  Surgeon: Kinsinger, Arta Bruce, MD;  Location: [place];  Service: General;  Laterality: Right;  GENERAL AND TAP BLOCK   INSERTION OF MESH Right 06/01/2017   Procedure: INSERTION OF MESH;  Surgeon: Kinsinger, Arta Bruce, MD;   Location: [place];  Service: General;  Laterality: Right;  GENERAL AND TAP BLOCK    Family History  Problem Relation Age of Onset   COPD Mother    Aortic aneurysm Father    Lung cancer Sister     Social History   Socioeconomic History   Marital status: Married    Spouse name: Not on file   Number of children: Not on file   Years of education: Not on file   Highest education level: Not on file  Occupational History   Not on file  Tobacco Use   Smoking status: Never   Smokeless tobacco: Never  Vaping Use   Vaping Use: Never used  Substance and Sexual Activity   Alcohol use: Yes    Comment: occ   Drug use: No   Sexual activity: Yes    Partners: Female    Birth control/protection: None  Other Topics Concern   Not on file  Social History Narrative   Not on file   Social Determinants of Health   Financial Resource Strain: Not on file  Food Insecurity: Not on file  Transportation Needs: Not on file  Physical Activity: Not on file  Stress: Not on file  Social Connections: Not on file  Intimate Partner Violence: Not on file    Outpatient Medications Prior to Visit  Medication Sig Dispense Refill   acetic acid-hydrocortisone (VOSOL-HC) OTIC solution Place 4 drops into the right ear  2 (two) times daily. 10 mL 0   amLODipine-valsartan (EXFORGE) 5-160 MG tablet TAKE 1 TABLET BY MOUTH DAILY. (TAKE WITH HCTZ 12.5 MG TABLET) 90 tablet 0   Cholecalciferol (VITAMIN D3) 125 MCG (5000 UT) TABS 5,000 IU OTC vitamin D3 daily. 90 tablet 3   CINNAMON PO Take 2 capsules by mouth daily.     Cyanocobalamin (VITAMIN B12 PO) Take 1 tablet by mouth daily.     empagliflozin (JARDIANCE) 25 MG TABS tablet Take 1 tablet (25 mg total) by mouth daily. 90 tablet 1   hydrochlorothiazide (HYDRODIURIL) 12.5 MG tablet TAKE 1 TABLET BY MOUTH DAILY. (TAKE WITH AMLODIPINE-VALSARTAN) 90 tablet 0   meclizine (ANTIVERT) 12.5 MG tablet Take 1 tablet (12.5 mg total) by mouth 2 (two) times  daily as needed for dizziness. 30 tablet 0   metFORMIN (GLUCOPHAGE) 500 MG tablet TAKE 2 TABLETS BY MOUTH IN THE MORNING AND 3 TABLETS IN THE EVENING 450 tablet 0   metoprolol succinate (TOPROL-XL) 50 MG 24 hr tablet Take 1 tablet (50 mg total) by mouth daily. TAKE WITH OR IMMEDIATELY FOLLOWING A MEAL. 90 tablet 1   Semaglutide,0.25 or 0.5MG/DOS, (OZEMPIC, 0.25 OR 0.5 MG/DOSE,) 2 MG/1.5ML SOPN INJECT 0.25 MG INTO THE SKIN ONCE WEEKLY FOR FOUR WEEKS. THEN INJECT 0.5 MG INTO THE SKIN ONCE WEEKLY 8 mL 0   simvastatin (ZOCOR) 80 MG tablet TAKE 1 TABLET BY MOUTH AT BEDTIME. 90 tablet 0   No facility-administered medications prior to visit.    No Known Allergies  ROS Review of Systems Review of Systems:  A fourteen system review of systems was performed and found to be positive as per HPI.   Objective:    Physical Exam General:  Well Developed, well nourished, appropriate for stated age.  Neuro:  Alert and oriented,  extra-ocular muscles intact  HEENT:  Normocephalic, atraumatic, neck supple Skin:  no gross rash, warm, pink. Cardiac:  RRR, S1 S2 Respiratory:  CTA B/L, Not using accessory muscles, speaking in full sentences- unlabored. Vascular:  Ext warm, no cyanosis apprec.; cap RF less 2 sec. No edema. Psych:  No HI/SI, judgement and insight good, Euthymic mood. Full Affect.  BP 118/76   Pulse (!) 52   Temp 97.6 F (36.4 C)   Ht _0  (1.727 m)   Wt 211 lb (95.7 kg)   SpO2 96%   BMI 32.08 kg/m  Wt Readings from Last 3 Encounters:  03/16/21 211 lb (95.7 kg)  02/17/21 209 lb (94.8 kg)  12/14/20 214 lb 9.6 oz (97.3 kg)     Health Maintenance Due  Topic Date Due   COLONOSCOPY (Pts 45-53yr Insurance coverage will need to be confirmed)  Never done   Zoster Vaccines- Shingrix (1 of 2) Never done   OPHTHALMOLOGY EXAM  02/06/2020   COVID-19 Vaccine (3 - Booster for Pfizer series) 09/04/2020   INFLUENZA VACCINE  01/18/2021    There are no preventive care reminders to display  for this patient.  Lab Results  Component Value Date   TSH 1.160 05/19/2020   Lab Results  Component Value Date   WBC 7.9 12/14/2020   HGB 13.5 12/14/2020   HCT 39.1 12/14/2020   MCV 84 12/14/2020   PLT 238 12/14/2020   Lab Results  Component Value Date   NA 137 12/14/2020   K 4.0 12/14/2020   CO2 21 12/14/2020   GLUCOSE 237 (H) 12/14/2020   BUN 12 12/14/2020   CREATININE 0.93 12/14/2020   BILITOT 0.7 12/14/2020  ALKPHOS 68 12/14/2020   AST 17 12/14/2020   ALT 17 12/14/2020   PROT 7.1 12/14/2020   ALBUMIN 4.5 12/14/2020   CALCIUM 9.5 12/14/2020   ANIONGAP 9 08/01/2018   EGFR 95 12/14/2020   Lab Results  Component Value Date   CHOL 125 12/14/2020   Lab Results  Component Value Date   HDL 37 (L) 12/14/2020   Lab Results  Component Value Date   LDLCALC 67 12/14/2020   Lab Results  Component Value Date   TRIG 118 12/14/2020   Lab Results  Component Value Date   CHOLHDL 3.4 12/14/2020   Lab Results  Component Value Date   HGBA1C 6.9 (A) 03/16/2021      Assessment & Plan:   Problem List Items Addressed This Visit       Cardiovascular and Mediastinum   Hypertension associated with diabetes ([place]) (Chronic)    -Controlled. -Continue current medication regimen. -Will continue to monitor.      Relevant Orders   Comp Met (CMET)   Lipid Profile     Endocrine   Diabetes mellitus type 2 with complications ([place]) - Primary (Chronic)    -A1c has improved form 7.7 to 6.9, will continue current medication regimen. -Encourage to continue weight loss efforts and monitor carbohydrate and glucose intake.      Relevant Orders   POCT glycosylated hemoglobin (Hb A1C) (Completed)   Comp Met (CMET)   CBC w/Diff   Lipid Profile   Hyperlipidemia associated with type 2 diabetes mellitus ([name]) (Chronic)    -Last lipid panel, WNL's. LDL at 67. -Continue current medication regimen. -Discussed low fat diet. Continue weight loss efforts.      Relevant Orders    Comp Met (CMET)   Lipid Profile   Other Visit Diagnoses     Obesity (BMI 30.0-34.9)          Obesity (BMI 30.0-34.9): -Associated with diabetes mellitus, hypertension and hyperlipidemia. -Patient has lost 3 pounds since last visit 12/14/2020. -Encourage to continue weight loss efforts with dietary changes.  No orders of the defined types were placed in this encounter.   Follow-up: Return in about 3 months (around 06/15/2021) for CPE and FBW (include PSA, Vit D).    Lorrene Reid, PA-C

## 2021-03-16 NOTE — Assessment & Plan Note (Signed)
-  Last lipid panel, WNL's. LDL at 67. -Continue current medication regimen. -Discussed low fat diet. Continue weight loss efforts.

## 2021-03-16 NOTE — Patient Instructions (Signed)
Diabetes Mellitus Basics Diabetes mellitus, or diabetes, is a long-term (chronic) disease. It occurs when the body does not properly use sugar (glucose) that is released from food after you eat. Diabetes mellitus may be caused by one or both of these problems: Your pancreas does not make enough of a hormone called insulin. Your body does not react in a normal way to the insulin that it makes. Insulin lets glucose enter cells in your body. This gives you energy. If you have diabetes, glucose cannot get into cells. This causes high blood glucose (hyperglycemia). How to treat and manage diabetes You may need to take insulin or other diabetes medicines daily to keep your glucose in balance. If you are prescribed insulin, you will learn how to give yourself insulin by injection. You may need to adjust the amount of insulin you take based on the foods that you eat. You will need to check your blood glucose levels using a glucose monitor as told by your health care provider. The readings can help determine if you have low or high blood glucose. Generally, you should have these blood glucose levels: Before meals (preprandial): 80-130 mg/dL (4.4-7.2 mmol/L). After meals (postprandial): below 180 mg/dL (10 mmol/L). Hemoglobin A1c (HbA1c) level: less than 7%. Your health care provider will set treatment goals for you. Keep all follow-up visits. This is important. Follow these instructions at home: Diabetes medicines Take your diabetes medicines every day as told by your health care provider. List your diabetes medicines here: Name of medicine: ______________________________ Amount (dose): _______________ Time (a.m./p.m.): _______________ Notes: ___________________________________ Name of medicine: ______________________________ Amount (dose): _______________ Time (a.m./p.m.): _______________ Notes: ___________________________________ Name of medicine: ______________________________ Amount (dose):  _______________ Time (a.m./p.m.): _______________ Notes: ___________________________________ Insulin If you use insulin, list the types of insulin you use here: Insulin type: ______________________________ Amount (dose): _______________ Time (a.m./p.m.): _______________Notes: ___________________________________ Insulin type: ______________________________ Amount (dose): _______________ Time (a.m./p.m.): _______________ Notes: ___________________________________ Insulin type: ______________________________ Amount (dose): _______________ Time (a.m./p.m.): _______________ Notes: ___________________________________ Insulin type: ______________________________ Amount (dose): _______________ Time (a.m./p.m.): _______________ Notes: ___________________________________ Insulin type: ______________________________ Amount (dose): _______________ Time (a.m./p.m.): _______________ Notes: ___________________________________ Managing blood glucose Check your blood glucose levels using a glucose monitor as told by your health care provider. Write down the times that you check your glucose levels here: Time: _______________ Notes: ___________________________________ Time: _______________ Notes: ___________________________________ Time: _______________ Notes: ___________________________________ Time: _______________ Notes: ___________________________________ Time: _______________ Notes: ___________________________________ Time: _______________ Notes: ___________________________________  Low blood glucose Low blood glucose (hypoglycemia) is when glucose is at or below 70 mg/dL (3.9 mmol/L). Symptoms may include: Feeling: Hungry. Sweaty and clammy. Irritable or easily upset. Dizzy. Sleepy. Having: A fast heartbeat. A headache. A change in your vision. Numbness around the mouth, lips, or tongue. Having trouble with: Moving (coordination). Sleeping. Treating low blood glucose To treat low blood  glucose, eat or drink something containing sugar right away. If you can think clearly and swallow safely, follow the 15:15 rule: Take 15 grams of a fast-acting carb (carbohydrate), as told by your health care provider. Some fast-acting carbs are: Glucose tablets: take 3-4 tablets. Hard candy: eat 3-5 pieces. Fruit juice: drink 4 oz (120 mL). Regular (not diet) soda: drink 4-6 oz (120-180 mL). Honey or sugar: eat 1 Tbsp (15 mL). Check your blood glucose levels 15 minutes after you take the carb. If your glucose is still at or below 70 mg/dL (3.9 mmol/L), take 15 grams of a carb again. If your glucose does not go above 70 mg/dL (3.9 mmol/L) after 3 tries,  get help right away. After your glucose goes back to normal, eat a meal or a snack within 1 hour. Treating very low blood glucose If your glucose is at or below 54 mg/dL (3 mmol/L), you have very low blood glucose (severe hypoglycemia). This is an emergency. Do not wait to see if the symptoms will go away. Get medical help right away. Call your local emergency services (911 in the [place] Do not drive yourself to the hospital. Questions to ask your health care provider Should I talk with a diabetes educator? What equipment will I need to care for myself at home? What diabetes medicines do I need? When should I take them? How often do I need to check my blood glucose levels? What number can I call if I have questions? When is my follow-up visit? Where can I find a support group for people with diabetes? Where to find more information American Diabetes Association: www.diabetes.org Association of Diabetes Care and Education Specialists: www.diabeteseducator.org Contact a health care provider if: Your blood glucose is at or above 240 mg/dL (13.3 mmol/L) for 2 days in a row. You have been sick or have had a fever for 2 days or more, and you are not getting better. You have any of these problems for more than 6 hours: You cannot eat or  drink. You feel nauseous. You vomit. You have diarrhea. Get help right away if: Your blood glucose is lower than 54 mg/dL (3 mmol/L). You get confused. You have trouble thinking clearly. You have trouble breathing. These symptoms may represent a serious problem that is an emergency. Do not wait to see if the symptoms will go away. Get medical help right away. Call your local emergency services (911 in the [place] Do not drive yourself to the hospital. Summary Diabetes mellitus is a chronic disease that occurs when the body does not properly use sugar (glucose) that is released from food after you eat. Take insulin and diabetes medicines as told. Check your blood glucose every day, as often as told. Keep all follow-up visits. This is important. This information is not intended to replace advice given to you by your health care provider. Make sure you discuss any questions you have with your health care provider. Document Revised: 10/08/2019 Document Reviewed: 10/08/2019 Elsevier Patient Education  [place].

## 2021-03-16 NOTE — Assessment & Plan Note (Signed)
-  A1c has improved form 7.7 to 6.9, will continue current medication regimen. -Encourage to continue weight loss efforts and monitor carbohydrate and glucose intake.

## 2021-03-17 LAB — LIPID PANEL
Chol/HDL Ratio: 3.6 ratio (ref 0.0–5.0)
Cholesterol, Total: 125 mg/dL (ref 100–199)
HDL: 35 mg/dL — ABNORMAL LOW (ref >39)
LDL Chol Calc (NIH): 66 mg/dL (ref 0–99)
Triglycerides: 137 mg/dL (ref 0–149)
VLDL Cholesterol Cal: 24 mg/dL (ref 5–40)

## 2021-03-17 LAB — CBC WITH DIFFERENTIAL/PLATELET
Basophils Absolute: 0.1 10*3/uL (ref 0.0–0.2)
Basos: 1 %
EOS (ABSOLUTE): 0.5 10*3/uL — ABNORMAL HIGH (ref 0.0–0.4)
Eos: 5 %
Hematocrit: 43.8 % (ref 37.5–51.0)
Hemoglobin: 14.8 g/dL (ref 13.0–17.7)
Immature Grans (Abs): 0 10*3/uL (ref 0.0–0.1)
Immature Granulocytes: 0 %
Lymphocytes Absolute: 3.1 10*3/uL (ref 0.7–3.1)
Lymphs: 35 %
MCH: 28.7 pg (ref 26.6–33.0)
MCHC: 33.8 g/dL (ref 31.5–35.7)
MCV: 85 fL (ref 79–97)
Monocytes Absolute: 0.9 10*3/uL (ref 0.1–0.9)
Monocytes: 10 %
Neutrophils Absolute: 4.4 10*3/uL (ref 1.4–7.0)
Neutrophils: 49 %
Platelets: 282 10*3/uL (ref 150–450)
RBC: 5.15 x10E6/uL (ref 4.14–5.80)
RDW: 12.6 % (ref 11.6–15.4)
WBC: 8.9 10*3/uL (ref 3.4–10.8)

## 2021-03-17 LAB — COMPREHENSIVE METABOLIC PANEL
ALT: 18 IU/L (ref 0–44)
AST: 14 IU/L (ref 0–40)
Albumin/Globulin Ratio: 1.7 (ref 1.2–2.2)
Albumin: 4.6 g/dL (ref 3.8–4.9)
Alkaline Phosphatase: 67 IU/L (ref 44–121)
BUN/Creatinine Ratio: 14 (ref 9–20)
BUN: 14 mg/dL (ref 6–24)
Bilirubin Total: 0.2 mg/dL (ref 0.0–1.2)
CO2: 23 mmol/L (ref 20–29)
Calcium: 10.2 mg/dL (ref 8.7–10.2)
Chloride: 99 mmol/L (ref 96–106)
Creatinine, Ser: 1 mg/dL (ref 0.76–1.27)
Globulin, Total: 2.7 g/dL (ref 1.5–4.5)
Glucose: 112 mg/dL — ABNORMAL HIGH (ref 70–99)
Potassium: 4.4 mmol/L (ref 3.5–5.2)
Sodium: 140 mmol/L (ref 134–144)
Total Protein: 7.3 g/dL (ref 6.0–8.5)
eGFR: 87 mL/min/{1.73_m2} (ref >59)

## 2021-03-29 ENCOUNTER — Other Ambulatory Visit: Payer: Self-pay | Admitting: Physician Assistant

## 2021-03-29 DIAGNOSIS — E118 Type 2 diabetes mellitus with unspecified complications: Secondary | ICD-10-CM

## 2021-04-16 ENCOUNTER — Ambulatory Visit: Payer: BC Managed Care – PPO | Admitting: Nurse Practitioner

## 2021-04-16 ENCOUNTER — Encounter: Payer: Self-pay | Admitting: Nurse Practitioner

## 2021-04-16 ENCOUNTER — Other Ambulatory Visit: Payer: Self-pay | Admitting: Physician Assistant

## 2021-04-16 ENCOUNTER — Other Ambulatory Visit: Payer: Self-pay

## 2021-04-16 VITALS — BP 115/71 | HR 57 | Temp 97.7°F | Ht 68.0 in | Wt 214.5 lb

## 2021-04-16 DIAGNOSIS — E118 Type 2 diabetes mellitus with unspecified complications: Secondary | ICD-10-CM

## 2021-04-16 DIAGNOSIS — B029 Zoster without complications: Secondary | ICD-10-CM | POA: Diagnosis not present

## 2021-04-16 DIAGNOSIS — E1169 Type 2 diabetes mellitus with other specified complication: Secondary | ICD-10-CM

## 2021-04-16 DIAGNOSIS — R21 Rash and other nonspecific skin eruption: Secondary | ICD-10-CM | POA: Insufficient documentation

## 2021-04-16 MED ORDER — TRIAMCINOLONE ACETONIDE 0.1 % EX OINT: 1.0000 "application " | TOPICAL_OINTMENT | Freq: Two times a day (BID) | CUTANEOUS | 2 refills | Status: DC

## 2021-04-16 MED ORDER — METHYLPREDNISOLONE 4 MG PO TBPK: ORAL_TABLET | ORAL | 0 refills | Status: DC

## 2021-04-16 MED ORDER — VALACYCLOVIR HCL 1 G PO TABS: 1000.0000 mg | ORAL_TABLET | Freq: Two times a day (BID) | ORAL | 0 refills | Status: DC

## 2021-04-16 NOTE — Progress Notes (Signed)
Acute Office Visit  Subjective:    Patient ID: Russell Robles, male    DOB: 05/31/1961, 60 y.o.   MRN: [id number]  Chief Complaint  Patient presents with   Herpes Zoster    Patient states that he has three areas on chest and right side which are burning, warm to touch, and very tender. He states that even his shirt touching these areas is irritating. He does not have rash or blisters. Denies fever, chills, or body aches. Denies nausea, vomiting, or diarrhea. He states that symptoms have been preset for 2 to 3 days. He has not had a shingles vaccine.    Past Medical History:  Diagnosis Date   Anxiety    Arthritis    Cough    PER PT NON-PRODUCTIVE , NO FEVER OR CONGESTION   Hernia, inguinal, right    Hyperlipidemia    Hypertension    Type 2 diabetes mellitus ([place])    last  HbA1c 7.2 on 05-09-2017 in epic    Past Surgical History:  Procedure Laterality Date   CARPAL TUNNEL RELEASE Left 08/02/2018   Procedure: CARPAL TUNNEL RELEASE;  Surgeon: Thornton Park, MD;  Location: [facility];  Service: Orthopedics;  Laterality: Left;   HERNIA REPAIR N/A    [employer] 05/18/2020   INGUINAL HERNIA REPAIR Left 12-16-2010  dr Donne Hazel  [place] Right 06/01/2017   Procedure: OPEN RIGHT INGUINAL HERNIA REPAIR WITH MESH;  Surgeon: Kinsinger, Arta Bruce, MD;  Location: [place];  Service: General;  Laterality: Right;  GENERAL AND TAP BLOCK   INSERTION OF MESH Right 06/01/2017   Procedure: INSERTION OF MESH;  Surgeon: Kinsinger, Arta Bruce, MD;  Location: [place];  Service: General;  Laterality: Right;  GENERAL AND TAP BLOCK    Family History  Problem Relation Age of Onset   COPD Mother    Aortic aneurysm Father    Lung cancer Sister     Social History   Socioeconomic History   Marital status: Married    Spouse name: Not on file   Number of children: Not on file   Years of education: Not on file   Highest education level:  Not on file  Occupational History   Not on file  Tobacco Use   Smoking status: Never   Smokeless tobacco: Never  Vaping Use   Vaping Use: Never used  Substance and Sexual Activity   Alcohol use: Yes    Comment: occ   Drug use: No   Sexual activity: Yes    Partners: Female    Birth control/protection: None  Other Topics Concern   Not on file  Social History Narrative   Not on file   Social Determinants of Health   Financial Resource Strain: Not on file  Food Insecurity: Not on file  Transportation Needs: Not on file  Physical Activity: Not on file  Stress: Not on file  Social Connections: Not on file  Intimate Partner Violence: Not on file    Outpatient Medications Prior to Visit  Medication Sig Dispense Refill   acetic acid-hydrocortisone (VOSOL-HC) OTIC solution Place 4 drops into the right ear 2 (two) times daily. 10 mL 0   amLODipine-valsartan (EXFORGE) 5-160 MG tablet TAKE 1 TABLET BY MOUTH DAILY. (TAKE WITH HCTZ 12.5 MG TABLET) 90 tablet 0   Cholecalciferol (VITAMIN D3) 125 MCG (5000 UT) TABS 5,000 IU OTC vitamin D3 daily. 90 tablet 3   CINNAMON PO Take 2 capsules by  mouth daily.     Cyanocobalamin (VITAMIN B12 PO) Take 1 tablet by mouth daily.     empagliflozin (JARDIANCE) 25 MG TABS tablet Take 1 tablet (25 mg total) by mouth daily. 90 tablet 1   hydrochlorothiazide (HYDRODIURIL) 12.5 MG tablet TAKE 1 TABLET BY MOUTH DAILY. (TAKE WITH AMLODIPINE-VALSARTAN) 90 tablet 0   meclizine (ANTIVERT) 12.5 MG tablet Take 1 tablet (12.5 mg total) by mouth 2 (two) times daily as needed for dizziness. 30 tablet 0   metFORMIN (GLUCOPHAGE) 500 MG tablet TAKE 2 TABLETS BY MOUTH IN THE MORNING AND 3 TABLETS IN THE EVENING 450 tablet 0   metoprolol succinate (TOPROL-XL) 50 MG 24 hr tablet Take 1 tablet (50 mg total) by mouth daily. TAKE WITH OR IMMEDIATELY FOLLOWING A MEAL. 90 tablet 1   Semaglutide,0.25 or 0.5MG/DOS, (OZEMPIC, 0.25 OR 0.5 MG/DOSE,) 2 MG/1.5ML SOPN INJECT 0.25 MG INTO  THE SKIN ONCE WEEKLY FOR FOUR WEEKS. THEN INJECT 0.5 MG INTO THE SKIN ONCE WEEKLY 8 mL 0   simvastatin (ZOCOR) 80 MG tablet TAKE 1 TABLET BY MOUTH AT BEDTIME. 90 tablet 0   No facility-administered medications prior to visit.    No Known Allergies  Review of Systems  Constitutional:  Negative for activity change, chills, fatigue and fever.  HENT:  Negative for congestion, postnasal drip, rhinorrhea, sinus pressure, sinus pain, sneezing and sore throat.   Eyes: Negative.   Respiratory:  Negative for cough, shortness of breath and wheezing.   Cardiovascular:  Negative for chest pain and palpitations.  Gastrointestinal:  Negative for constipation, diarrhea, nausea and vomiting.  Endocrine: Negative for cold intolerance, heat intolerance, polydipsia and polyuria.  Genitourinary:  Negative for dysuria, frequency and urgency.  Musculoskeletal:  Negative for back pain and myalgias.  Skin:  Negative for rash.       Skin is burring and warm in three areas on the right side of his chest and right flank. He hsa not noted rash or blisters.   Allergic/Immunologic: Negative for environmental allergies.  Neurological:  Negative for dizziness, weakness and headaches.  Psychiatric/Behavioral:  The patient is not nervous/anxious.       Objective:    Physical Exam Vitals and nursing note reviewed.  Constitutional:      Appearance: Normal appearance. He is well-developed.  HENT:     Head: Normocephalic and atraumatic.  Eyes:     Pupils: Pupils are equal, round, and reactive to light.  Cardiovascular:     Rate and Rhythm: Normal rate and regular rhythm.     Pulses: Normal pulses.     Heart sounds: Normal heart sounds.  Pulmonary:     Effort: Pulmonary effort is normal.     Breath sounds: Normal breath sounds.  Abdominal:     Palpations: Abdomen is soft.  Musculoskeletal:        General: Normal range of motion.     Cervical back: Normal range of motion and neck supple.  Lymphadenopathy:      Cervical: No cervical adenopathy.  Skin:    General: Skin is warm and dry.     Capillary Refill: Capillary refill takes less than 2 seconds.       Neurological:     General: No focal deficit present.     Mental Status: He is alert and oriented to person, place, and time.  Psychiatric:        Mood and Affect: Mood normal.        Behavior: Behavior normal.  Thought Content: Thought content normal.        Judgment: Judgment normal.   Today's Vitals   04/16/21 0905  BP: 115/71  Pulse: (!) 57  Temp: 97.7 F (36.5 C)  SpO2: 95%  Weight: 214 lb 8 oz (97.3 kg)  Height: 5' 8"  (1.727 m)   Body mass index is 32.61 kg/m.   Wt Readings from Last 3 Encounters:  04/16/21 214 lb 8 oz (97.3 kg)  03/16/21 211 lb (95.7 kg)  02/17/21 209 lb (94.8 kg)    Health Maintenance Due  Topic Date Due   Pneumococcal Vaccine 2-4 Years old (1 - PCV) Never done   COLONOSCOPY (Pts 45-16yr Insurance coverage will need to be confirmed)  Never done   Zoster Vaccines- Shingrix (1 of 2) Never done   OPHTHALMOLOGY EXAM  02/06/2020   COVID-19 Vaccine (3 - Booster for Pfizer series) 06/01/2020   INFLUENZA VACCINE  01/18/2021    There are no preventive care reminders to display for this patient.   Lab Results  Component Value Date   TSH 1.160 05/19/2020   Lab Results  Component Value Date   WBC 8.9 03/16/2021   HGB 14.8 03/16/2021   HCT 43.8 03/16/2021   MCV 85 03/16/2021   PLT 282 03/16/2021   Lab Results  Component Value Date   NA 140 03/16/2021   K 4.4 03/16/2021   CO2 23 03/16/2021   GLUCOSE 112 (H) 03/16/2021   BUN 14 03/16/2021   CREATININE 1.00 03/16/2021   BILITOT 0.2 03/16/2021   ALKPHOS 67 03/16/2021   AST 14 03/16/2021   ALT 18 03/16/2021   PROT 7.3 03/16/2021   ALBUMIN 4.6 03/16/2021   CALCIUM 10.2 03/16/2021   ANIONGAP 9 08/01/2018   EGFR 87 03/16/2021   Lab Results  Component Value Date   CHOL 125 03/16/2021   Lab Results  Component Value Date   HDL 35  (L) 03/16/2021   Lab Results  Component Value Date   LDLCALC 66 03/16/2021   Lab Results  Component Value Date   TRIG 137 03/16/2021   Lab Results  Component Value Date   CHOLHDL 3.6 03/16/2021   Lab Results  Component Value Date   HGBA1C 6.9 (A) 03/16/2021       Assessment & Plan:  1. Herpes zoster without complication Thoug no rash or blistering present, suspect shingles infection. Start valtrex [id number] daily for 5 days. Dosing may be repeated for persistent symptoms. Start medrol dose pack. Take as directed for 6 days.  - valACYclovir (VALTREX) 1000 MG tablet; Take 1 tablet (1,000 mg total) by mouth 2 (two) times daily.  Dispense: 20 tablet; Refill: 0 - methylPREDNISolone (MEDROL) 4 MG TBPK tablet; Take by mouth as directed for 6 days  Dispense: 21 tablet; Refill: 0  2. Rash and nonspecific skin eruption May apply triamcinolone ointment to all effected areas twice daily as needed for irritated and itchy skin.  - triamcinolone ointment (KENALOG) 0.1 %; Apply 1 application topically 2 (two) times daily.  Dispense: 80 g; Refill: 2  3. Diabetes mellitus type 2 with complications ([facility]irAdvised patient that blood sugars may increase while taking medrol taper. He should monitor more closely for next 2 weeks. He should ocntact the office if blood sugars go above 300 and do not come down with medication. He voiced understanding and agreement.    Problem List Items Addressed This Visit       Endocrine   Diabetes mellitus type 2 with complications (  [name]) (Chronic)     Musculoskeletal and Integument   Rash and nonspecific skin eruption   Relevant Medications   triamcinolone ointment (KENALOG) 0.1 %     Other   Herpes zoster without complication - Primary   Relevant Medications   valACYclovir (VALTREX) 1000 MG tablet   methylPREDNISolone (MEDROL) 4 MG TBPK tablet     Meds ordered this encounter  Medications   valACYclovir (VALTREX) 1000 MG tablet    Sig: Take 1 tablet  (1,000 mg total) by mouth 2 (two) times daily.    Dispense:  20 tablet    Refill:  0    Order Specific Question:   Supervising Provider    Answer:   Beatrice Lecher D [[id number]]   methylPREDNISolone (MEDROL) 4 MG TBPK tablet    Sig: Take by mouth as directed for 6 days    Dispense:  21 tablet    Refill:  0    Order Specific Question:   Supervising Provider    Answer:   Beatrice Lecher D [[id number]]   triamcinolone ointment (KENALOG) 0.1 %    Sig: Apply 1 application topically 2 (two) times daily.    Dispense:  80 g    Refill:  2    Order Specific Question:   Supervising Provider    Answer:   Beatrice Lecher D [[id number]]     Ronnell [place], NP

## 2021-05-11 DIAGNOSIS — H25013 Cortical age-related cataract, bilateral: Secondary | ICD-10-CM | POA: Diagnosis not present

## 2021-05-11 DIAGNOSIS — E119 Type 2 diabetes mellitus without complications: Secondary | ICD-10-CM | POA: Diagnosis not present

## 2021-05-11 DIAGNOSIS — Z7984 Long term (current) use of oral hypoglycemic drugs: Secondary | ICD-10-CM | POA: Diagnosis not present

## 2021-05-11 DIAGNOSIS — H2513 Age-related nuclear cataract, bilateral: Secondary | ICD-10-CM | POA: Diagnosis not present

## 2021-05-11 LAB — HM DIABETES EYE EXAM

## 2021-06-07 ENCOUNTER — Other Ambulatory Visit: Payer: Self-pay | Admitting: Physician Assistant

## 2021-06-07 DIAGNOSIS — E118 Type 2 diabetes mellitus with unspecified complications: Secondary | ICD-10-CM

## 2021-06-17 ENCOUNTER — Other Ambulatory Visit: Payer: Self-pay

## 2021-06-17 ENCOUNTER — Encounter: Payer: Self-pay | Admitting: Physician Assistant

## 2021-06-17 ENCOUNTER — Ambulatory Visit (INDEPENDENT_AMBULATORY_CARE_PROVIDER_SITE_OTHER): Payer: BC Managed Care – PPO | Admitting: Physician Assistant

## 2021-06-17 VITALS — BP 118/73 | HR 65 | Temp 97.7°F | Ht 68.0 in | Wt 210.0 lb

## 2021-06-17 DIAGNOSIS — E118 Type 2 diabetes mellitus with unspecified complications: Secondary | ICD-10-CM

## 2021-06-17 DIAGNOSIS — E1159 Type 2 diabetes mellitus with other circulatory complications: Secondary | ICD-10-CM

## 2021-06-17 DIAGNOSIS — E559 Vitamin D deficiency, unspecified: Secondary | ICD-10-CM

## 2021-06-17 DIAGNOSIS — E1169 Type 2 diabetes mellitus with other specified complication: Secondary | ICD-10-CM

## 2021-06-17 DIAGNOSIS — Z23 Encounter for immunization: Secondary | ICD-10-CM | POA: Diagnosis not present

## 2021-06-17 DIAGNOSIS — E785 Hyperlipidemia, unspecified: Secondary | ICD-10-CM

## 2021-06-17 DIAGNOSIS — Z125 Encounter for screening for malignant neoplasm of prostate: Secondary | ICD-10-CM | POA: Diagnosis not present

## 2021-06-17 DIAGNOSIS — Z0000 Encounter for general adult medical examination without abnormal findings: Secondary | ICD-10-CM | POA: Diagnosis not present

## 2021-06-17 DIAGNOSIS — Z1211 Encounter for screening for malignant neoplasm of colon: Secondary | ICD-10-CM

## 2021-06-17 DIAGNOSIS — I152 Hypertension secondary to endocrine disorders: Secondary | ICD-10-CM

## 2021-06-17 LAB — POCT GLYCOSYLATED HEMOGLOBIN (HGB A1C): Hemoglobin A1C: 6.5 % — AB (ref 4.0–5.6)

## 2021-06-17 NOTE — Progress Notes (Signed)
Male physical   Impression and Recommendations:    1. Healthcare maintenance   2. Diabetes mellitus type 2 with complications ([place])   3. Hypertension associated with diabetes ([place])   4. Hyperlipidemia associated with type 2 diabetes mellitus ([place])   5. Screening for prostate cancer   6. Vitamin D deficiency   7. Screening for colon cancer   8. Need for influenza vaccination      1) Anticipatory Guidance: Skin CA prevention- recommend to use sunscreen when outside along with skin surveillance; eat a balanced and modest diet; physical activity at least 25 minutes per day or minimum of 150 min/ week moderate to intense activity.  2) Immunizations / Screenings / Labs:   All immunizations are up-to-date per recommendations or will be updated today if pt allows.    - Patient understands with dental and vision screens they will schedule independently.  - Will obtain CBC, CMP, HgA1c, Lipid panel, TSH and vit D, pt fasting. - Pt agreeable to colonoscopy and influenza vaccine. Deferred Shingrix until next visit, had Shingles about 2 months ago.  3) Weight:  Recommend to continue to improve diet habits to improve overall feelings of well being and objective health data. Improve nutrient density of diet through increasing intake of fruits and vegetables and decreasing saturated fats, white flour products and refined sugars.   4) Healthcare Maintenance: -T2DM stable, A1c has improved from 6.9 to 6.5. -Continue weight loss efforts with dietary changes and reduced calories. -Continue current medication regimen. -Recommend to stay well hydrated. -Follow up in 4 months for DM, HTN, HLD   Orders Placed This Encounter  Procedures   Flu Vaccine QUAD 33mo+IM (Fluarix, Fluzone & Alfiuria Quad PF)   CBC with Differential/Platelet    Standing Status:   Future    Number of Occurrences:   1    Standing Expiration Date:   07/18/2021   Comprehensive metabolic panel    Standing Status:   Future     Number of Occurrences:   1    Standing Expiration Date:   07/18/2021    Order Specific Question:   Has the patient fasted?    Answer:   Yes   Lipid panel    Standing Status:   Future    Number of Occurrences:   1    Standing Expiration Date:   07/18/2021    Order Specific Question:   Has the patient fasted?    Answer:   Yes   TSH    Standing Status:   Future    Number of Occurrences:   1    Standing Expiration Date:   07/18/2021   PSA    Standing Status:   Future    Number of Occurrences:   1    Standing Expiration Date:   07/18/2021   VITAMIN D 25 Hydroxy (Vit-D Deficiency, Fractures)    Standing Status:   Future    Number of Occurrences:   1    Standing Expiration Date:   07/18/2021   Ambulatory referral to Gastroenterology    Referral Priority:   Routine    Referral Type:   Consultation    Referral Reason:   Specialty Services Required    Number of Visits Requested:   1   POCT glycosylated hemoglobin (Hb A1C)    No orders of the defined types were placed in this encounter.    Return in about 4 months (around 10/16/2021) for DM, HTN, HLD.    [name]  side effects, risk and benefits, and alternatives of medications discussed with patient.  Patient is aware that all medications have potential side effects and we are unable to predict every side effect or drug-drug interaction that may occur.  Expresses verbal understanding and consents to current therapy plan and treatment regimen.  Please see AVS handed out to patient at the end of our visit for further patient instructions/ counseling done pertaining to today's office visit.       Subjective:        CC: CPE   HPI: Russell Robles is a 60 y.o. male who presents to [facility] today for a yearly health maintenance exam.     Health Maintenance Summary  - Reviewed and updated, unless pt declines services.  Last Cologuard or Colonoscopy:   placed order for screening colonoscopy   Tobacco History Reviewed:  yes, never a smoker Abdominal Ultrasound:  n/a CT scan for screening lung CA: n/a Alcohol / drug use:    No concerns, no excessive use / no use Dental Home: yes  Eye exams: yes  Male history: STD concerns:   none Additional penile/ urinary concerns: none   Additional concerns beyond Health Maintenance issues:  none    Immunization History  Administered Date(s) Administered   Influenza,inj,Quad PF,6+ Mos 06/19/2015, 08/01/2017, 06/26/2018, 06/17/2021   Influenza-Unspecified 03/19/2014, 06/19/2015   PFIZER Comirnaty(Gray Top)Covid-19 Tri-Sucrose Vaccine 03/16/2020, 04/06/2020   Tdap 06/08/2012     Health Maintenance  Topic Date Due   Pneumococcal Vaccine 81-13 Years old (1 - PCV) Never done   COLONOSCOPY (Pts 45-61yrs Insurance coverage will need to be confirmed)  Never done   Zoster Vaccines- Shingrix (1 of 2) Never done   OPHTHALMOLOGY EXAM  02/06/2020   COVID-19 Vaccine (3 - Booster for Pfizer series) 06/01/2020   HEMOGLOBIN A1C  12/16/2021   FOOT EXAM  03/16/2022   TETANUS/TDAP  06/08/2022   INFLUENZA VACCINE  Completed   Hepatitis C Screening  Completed   HIV Screening  Completed   HPV VACCINES  Aged Out       Wt Readings from Last 3 Encounters:  06/17/21 210 lb (95.3 kg)  04/16/21 214 lb 8 oz (97.3 kg)  03/16/21 211 lb (95.7 kg)   BP Readings from Last 3 Encounters:  06/17/21 118/73  04/16/21 115/71  03/16/21 118/76   Pulse Readings from Last 3 Encounters:  06/17/21 65  04/16/21 (!) 57  03/16/21 (!) 52    Patient Active Problem List   Diagnosis Date Noted   Herpes zoster without complication 19/37/9024   Rash and nonspecific skin eruption 04/16/2021   Acute eczematoid otitis externa of right ear 02/28/2021   Vertigo 02/28/2021   Strain of long head of biceps 04/27/2020   Strain of rotator cuff capsule 04/17/2018   Impingement syndrome of shoulder region, left 04/16/2018   Bursitis of shoulder, left 04/16/2018    Cortical age-related cataract of both eyes 02/01/2018   Dermatochalasis of both upper eyelids 02/01/2018   Nuclear sclerotic cataract of both eyes 02/01/2018   Type 2 diabetes mellitus without retinopathy ([place]) 02/01/2018   new onset Murmur, heart 12/19/2017   Acute pain of left shoulder 10/17/2017   Obesity, Class I, BMI 30-34.9 04/12/2017   Inactivity 04/12/2017   Age-related nuclear cataract of both eyes 06/18/2015   Diabetes mellitus type 2 with complications ([place]) 09/73/5329   Hypertension associated with diabetes ([place]) 06/18/2015   Hyperlipidemia associated with type 2 diabetes mellitus ([place])  06/18/2015   Presbyopia 06/18/2015   Inguinal hernia unilateral, non-recurrent 12/13/2010    Past Medical History:  Diagnosis Date   Anxiety    Arthritis    Cough    PER PT NON-PRODUCTIVE , NO FEVER OR CONGESTION   Hernia, inguinal, right    Hyperlipidemia    Hypertension    Type 2 diabetes mellitus ([place])    last  HbA1c 7.2 on 05-09-2017 in epic    Past Surgical History:  Procedure Laterality Date   CARPAL TUNNEL RELEASE Left 08/02/2018   Procedure: CARPAL TUNNEL RELEASE;  Surgeon: Thornton Park, MD;  Location: [facility];  Service: Orthopedics;  Laterality: Left;   HERNIA REPAIR N/A    Phreesia 05/18/2020   INGUINAL HERNIA REPAIR Left 12-16-2010  dr Donne Hazel  [facility]   INGUINAL HERNIA REPAIR Right 06/01/2017   Procedure: OPEN RIGHT INGUINAL HERNIA REPAIR WITH MESH;  Surgeon: Kinsinger, Arta Bruce, MD;  Location: [place];  Service: General;  Laterality: Right;  GENERAL AND TAP BLOCK   INSERTION OF MESH Right 06/01/2017   Procedure: INSERTION OF MESH;  Surgeon: Kinsinger, Arta Bruce, MD;  Location: [place];  Service: General;  Laterality: Right;  GENERAL AND TAP BLOCK    Family History  Problem Relation Age of Onset   COPD Mother    Aortic aneurysm Father    Lung cancer Sister     Social History   Substance and Sexual Activity  Drug Use  No  ,  Social History   Substance and Sexual Activity  Alcohol Use Yes   Comment: occ  ,  Social History   Tobacco Use  Smoking Status Never  Smokeless Tobacco Never  ,  Social History   Substance and Sexual Activity  Sexual Activity Yes   Partners: Female   Birth control/protection: None    Patient's Medications  New Prescriptions   No medications on file  Previous Medications   ACETIC ACID-HYDROCORTISONE (VOSOL-HC) OTIC SOLUTION    Place 4 drops into the right ear 2 (two) times daily.   AMLODIPINE-VALSARTAN (EXFORGE) 5-160 MG TABLET    TAKE 1 TABLET BY MOUTH DAILY. (TAKE WITH HCTZ 12.5 MG TABLET)   CHOLECALCIFEROL (VITAMIN D3) 125 MCG (5000 UT) TABS    5,000 IU OTC vitamin D3 daily.   CINNAMON PO    Take 2 capsules by mouth daily.   CYANOCOBALAMIN (VITAMIN B12 PO)    Take 1 tablet by mouth daily.   HYDROCHLOROTHIAZIDE (HYDRODIURIL) 12.5 MG TABLET    TAKE 1 TABLET BY MOUTH DAILY. (TAKE WITH AMLODIPINE-VALSARTAN)   JARDIANCE 25 MG TABS TABLET    TAKE 1 TABLET (25 MG TOTAL) BY MOUTH DAILY.   MECLIZINE (ANTIVERT) 12.5 MG TABLET    Take 1 tablet (12.5 mg total) by mouth 2 (two) times daily as needed for dizziness.   METFORMIN (GLUCOPHAGE) 500 MG TABLET    TAKE 2 TABLETS BY MOUTH IN THE MORNING AND 3 TABLETS IN THE EVENING   METHYLPREDNISOLONE (MEDROL) 4 MG TBPK TABLET    Take by mouth as directed for 6 days   METOPROLOL SUCCINATE (TOPROL-XL) 50 MG 24 HR TABLET    Take 1 tablet (50 mg total) by mouth daily. TAKE WITH OR IMMEDIATELY FOLLOWING A MEAL.   SEMAGLUTIDE,0.25 OR 0.5MG /DOS, (OZEMPIC, 0.25 OR 0.5 MG/DOSE,) 2 MG/1.5ML SOPN    INJECT 0.25 MG INTO THE SKIN ONCE WEEKLY FOR FOUR WEEKS. THEN INJECT 0.5 MG INTO THE SKIN ONCE WEEKLY   SIMVASTATIN (ZOCOR) 80 MG  TABLET    TAKE 1 TABLET BY MOUTH AT BEDTIME.   TRIAMCINOLONE OINTMENT (KENALOG) 0.1 %    Apply 1 application topically 2 (two) times daily.   VALACYCLOVIR (VALTREX) 1000 MG TABLET    Take 1 tablet (1,000 mg total) by mouth 2  (two) times daily.  Modified Medications   No medications on file  Discontinued Medications   No medications on file    Patient has no known allergies.  Review of Systems: General:   Denies fever, chills, unexplained weight loss.  Optho/Auditory:   Denies visual changes, blurred vision/LOV Respiratory:   Denies SOB, DOE more than baseline levels.   Cardiovascular:   Denies chest pain, palpitations, new onset peripheral edema  Gastrointestinal:   Denies nausea, vomiting, diarrhea.  Genitourinary: Denies dysuria, freq/ urgency, flank pain  Endocrine:     Denies hot or cold intolerance, polyuria, polydipsia. Musculoskeletal:   Denies unexplained myalgias, joint swelling, unexplained arthralgias, gait problems.  Skin:  Denies rash, suspicious lesions Neurological:     Denies dizziness, unexplained weakness, numbness  Psychiatric/Behavioral:   Denies mood changes, suicidal or homicidal ideations, hallucinations    Objective:     Blood pressure 118/73, pulse 65, temperature 97.7 F (36.5 C), height 5\' 8"  (1.727 m), weight 210 lb (95.3 kg), SpO2 96 %. Body mass index is 31.93 kg/m. General Appearance:    Alert, cooperative, no distress, appears stated age  Head:    Normocephalic, without obvious abnormality, atraumatic  Eyes:    PERRL, conjunctiva/corneas clear, EOM's intact, fundi    benign, both eyes  Ears:    Normal TM's and external ear canal, left ear; cerumen impaction of right ear  Nose:   Nares normal, septum midline, mucosa normal, no drainage    or sinus tenderness  Throat:   Lips w/o lesion, mucosa moist, and tongue normal; teeth and   gums normal  Neck:   Supple, symmetrical, trachea midline, no adenopathy;    thyroid:  no enlargement/tenderness/nodules; no carotid   bruit or JVD  Back:     Symmetric, no curvature, ROM normal, no CVA tenderness  Lungs:     Clear to auscultation bilaterally, respirations unlabored, no  Wh/ R/ R  Chest Wall:    No tenderness or gross  deformity; normal excursion   Heart:    Regular rate and rhythm, S1 and S2 normal, no murmur  Abdomen:     Soft, non-tender, bowel sounds active all four quadrants, No G/R/R, no masses, no organomegaly  Genitalia:   Deferred.  Rectal:   Deferred.  Extremities:   Extremities normal, atraumatic, no cyanosis or gross edema  Pulses:   2+ and symmetric all extremities  Skin:   Warm, dry, Skin color, texture, turgor normal, no obvious rashes or lesions  M-Sk:   Ambulates * 4 w/o difficulty, no gross deformities, tone WNL  Neurologic:   CNII-XII grossly intact Psych:  No HI/SI, judgement and insight good, Euthymic mood. Full Affect.

## 2021-06-17 NOTE — Patient Instructions (Signed)

## 2021-06-18 LAB — CBC WITH DIFFERENTIAL/PLATELET
Basophils Absolute: 0.1 10*3/uL (ref 0.0–0.2)
Basos: 1 %
EOS (ABSOLUTE): 0.5 10*3/uL — ABNORMAL HIGH (ref 0.0–0.4)
Eos: 4 %
Hematocrit: 46.6 % (ref 37.5–51.0)
Hemoglobin: 15.9 g/dL (ref 13.0–17.7)
Immature Grans (Abs): 0 10*3/uL (ref 0.0–0.1)
Immature Granulocytes: 0 %
Lymphocytes Absolute: 3.6 10*3/uL — ABNORMAL HIGH (ref 0.7–3.1)
Lymphs: 32 %
MCH: 28.6 pg (ref 26.6–33.0)
MCHC: 34.1 g/dL (ref 31.5–35.7)
MCV: 84 fL (ref 79–97)
Monocytes Absolute: 0.9 10*3/uL (ref 0.1–0.9)
Monocytes: 8 %
Neutrophils Absolute: 6.1 10*3/uL (ref 1.4–7.0)
Neutrophils: 55 %
Platelets: 316 10*3/uL (ref 150–450)
RBC: 5.55 x10E6/uL (ref 4.14–5.80)
RDW: 12.7 % (ref 11.6–15.4)
WBC: 11.3 10*3/uL — ABNORMAL HIGH (ref 3.4–10.8)

## 2021-06-18 LAB — COMPREHENSIVE METABOLIC PANEL
ALT: 15 IU/L (ref 0–44)
AST: 15 IU/L (ref 0–40)
Albumin/Globulin Ratio: 1.7 (ref 1.2–2.2)
Albumin: 4.7 g/dL (ref 3.8–4.9)
Alkaline Phosphatase: 67 IU/L (ref 44–121)
BUN/Creatinine Ratio: 15 (ref 10–24)
BUN: 16 mg/dL (ref 8–27)
Bilirubin Total: 0.3 mg/dL (ref 0.0–1.2)
CO2: 24 mmol/L (ref 20–29)
Calcium: 10.5 mg/dL — ABNORMAL HIGH (ref 8.6–10.2)
Chloride: 100 mmol/L (ref 96–106)
Creatinine, Ser: 1.05 mg/dL (ref 0.76–1.27)
Globulin, Total: 2.8 g/dL (ref 1.5–4.5)
Glucose: 119 mg/dL — ABNORMAL HIGH (ref 70–99)
Potassium: 3.9 mmol/L (ref 3.5–5.2)
Sodium: 141 mmol/L (ref 134–144)
Total Protein: 7.5 g/dL (ref 6.0–8.5)
eGFR: 81 mL/min/{1.73_m2} (ref >59)

## 2021-06-18 LAB — LIPID PANEL
Chol/HDL Ratio: 3.6 ratio (ref 0.0–5.0)
Cholesterol, Total: 134 mg/dL (ref 100–199)
HDL: 37 mg/dL — ABNORMAL LOW (ref >39)
LDL Chol Calc (NIH): 61 mg/dL (ref 0–99)
Triglycerides: 222 mg/dL — ABNORMAL HIGH (ref 0–149)
VLDL Cholesterol Cal: 36 mg/dL (ref 5–40)

## 2021-06-18 LAB — PSA: Prostate Specific Ag, Serum: 1.2 ng/mL (ref 0.0–4.0)

## 2021-06-18 LAB — TSH: TSH: 1.19 u[IU]/mL (ref 0.450–4.500)

## 2021-06-18 LAB — VITAMIN D 25 HYDROXY (VIT D DEFICIENCY, FRACTURES): Vit D, 25-Hydroxy: 52.4 ng/mL (ref 30.0–100.0)

## 2021-07-05 ENCOUNTER — Other Ambulatory Visit: Payer: Self-pay | Admitting: Physician Assistant

## 2021-07-05 DIAGNOSIS — E1169 Type 2 diabetes mellitus with other specified complication: Secondary | ICD-10-CM

## 2021-07-05 DIAGNOSIS — E785 Hyperlipidemia, unspecified: Secondary | ICD-10-CM

## 2021-07-05 DIAGNOSIS — E118 Type 2 diabetes mellitus with unspecified complications: Secondary | ICD-10-CM

## 2021-07-21 ENCOUNTER — Other Ambulatory Visit: Payer: Self-pay | Admitting: Physician Assistant

## 2021-07-21 DIAGNOSIS — E118 Type 2 diabetes mellitus with unspecified complications: Secondary | ICD-10-CM

## 2021-08-11 ENCOUNTER — Other Ambulatory Visit: Payer: Self-pay | Admitting: Physician Assistant

## 2021-08-11 DIAGNOSIS — E1159 Type 2 diabetes mellitus with other circulatory complications: Secondary | ICD-10-CM

## 2021-08-16 ENCOUNTER — Encounter: Payer: Self-pay | Admitting: Gastroenterology

## 2021-08-24 ENCOUNTER — Other Ambulatory Visit: Payer: Self-pay

## 2021-08-24 ENCOUNTER — Ambulatory Visit (AMBULATORY_SURGERY_CENTER): Payer: BC Managed Care – PPO

## 2021-08-24 VITALS — Ht 68.0 in | Wt 200.0 lb

## 2021-08-24 DIAGNOSIS — Z1211 Encounter for screening for malignant neoplasm of colon: Secondary | ICD-10-CM

## 2021-08-24 MED ORDER — NA SULFATE-K SULFATE-MG SULF 17.5-3.13-1.6 GM/177ML PO SOLN: 1.0000 | Freq: Once | ORAL | 0 refills | Status: AC

## 2021-08-24 NOTE — Progress Notes (Signed)

## 2021-09-09 ENCOUNTER — Encounter: Payer: Self-pay | Admitting: Gastroenterology

## 2021-09-14 ENCOUNTER — Ambulatory Visit (AMBULATORY_SURGERY_CENTER): Payer: BC Managed Care – PPO | Admitting: Gastroenterology

## 2021-09-14 ENCOUNTER — Encounter: Payer: Self-pay | Admitting: Gastroenterology

## 2021-09-14 VITALS — BP 110/80 | HR 51 | Temp 98.9°F | Resp 13 | Ht 68.0 in | Wt 200.0 lb

## 2021-09-14 DIAGNOSIS — Z1211 Encounter for screening for malignant neoplasm of colon: Secondary | ICD-10-CM

## 2021-09-14 DIAGNOSIS — K621 Rectal polyp: Secondary | ICD-10-CM

## 2021-09-14 DIAGNOSIS — K529 Noninfective gastroenteritis and colitis, unspecified: Secondary | ICD-10-CM | POA: Diagnosis not present

## 2021-09-14 DIAGNOSIS — K5289 Other specified noninfective gastroenteritis and colitis: Secondary | ICD-10-CM | POA: Diagnosis not present

## 2021-09-14 DIAGNOSIS — D123 Benign neoplasm of transverse colon: Secondary | ICD-10-CM | POA: Diagnosis not present

## 2021-09-14 DIAGNOSIS — D128 Benign neoplasm of rectum: Secondary | ICD-10-CM

## 2021-09-14 MED ORDER — SODIUM CHLORIDE 0.9 % IV SOLN: 500.0000 mL | Freq: Once | INTRAVENOUS | Status: DC

## 2021-09-14 NOTE — Op Note (Signed)
[name] ?Patient Name: Russell Robles ?Procedure Date: 09/14/2021 2:48 PM ?MRN: [id number] ?Endoscopist: Thornton Park MD, MD ?Age: 61 ?Referring MD:  ?Date of Birth: 05-04-1961 ?Gender: Male ?Account #: [id number] ?Procedure:                Colonoscopy ?Indications:              Screening for colorectal malignant neoplasm ?                          Normal colonoscopy at age 20 ?                          No known family history of colon cancer or polyps ?Medicines:                Monitored Anesthesia Care ?Procedure:                Pre-Anesthesia Assessment: ?                          - Prior to the procedure, a History and Physical  ?                          was performed, and patient medications and  ?                          allergies were reviewed. The patient's tolerance of  ?                          previous anesthesia was also reviewed. The risks  ?                          and benefits of the procedure and the sedation  ?                          options and risks were discussed with the patient.  ?                          All questions were answered, and informed consent  ?                          was obtained. Prior Anticoagulants: The patient has  ?                          taken no previous anticoagulant or antiplatelet  ?                          agents. ASA Grade Assessment: II - A patient with  ?                          mild systemic disease. After reviewing the risks  ?                          and benefits, the patient was deemed in  ?  satisfactory condition to undergo the procedure. ?                          After obtaining informed consent, the colonoscope  ?                          was passed under direct vision. Throughout the  ?                          procedure, the patient's blood pressure, pulse, and  ?                          oxygen saturations were monitored continuously. The  ?                          CF [id number] #[id number] was introduced  through the anus  ?                          and advanced to the 3 cm into the ileum. A second  ?                          forward view of the right colon was performed. The  ?                          colonoscopy was performed without difficulty. The  ?                          patient tolerated the procedure well. The quality  ?                          of the bowel preparation was good. The terminal  ?                          ileum, ileocecal valve, appendiceal orifice, and  ?                          rectum were photographed. ?Scope In: 2:58:55 PM ?Scope Out: 3:16:52 PM ?Scope Withdrawal Time: 0 hours 14 minutes 10 seconds  ?Total Procedure Duration: 0 hours 17 minutes 57 seconds  ?Findings:                 The perianal and digital rectal examinations were  ?                          normal. ?                          A 2 mm polyp was found in the rectum. The polyp was  ?                          sessile. The polyp was removed with a cold snare.  ?                          Resection and retrieval were complete.  Estimated  ?                          blood loss was minimal. ?                          A 5 mm polyp was found in the splenic flexure. The  ?                          polyp was sessile. The polyp was removed with a  ?                          cold snare. Resection and retrieval were complete.  ?                          Estimated blood loss was minimal. ?                          A 4 mm polyp was found in the hepatic flexure. The  ?                          polyp was sessile. The polyp was removed with a  ?                          cold snare. Resection and retrieval were complete.  ?                          Estimated blood loss was minimal. ?                          A patchy area of mildly erythematous mucosa was  ?                          found in the cecum. Biopsies were taken with a cold  ?                          forceps for histology. Estimated blood loss was  ?                           minimal. ?                          The examined terminal ileum was normal. The exam  ?                          was otherwise without abnormality on direct and  ?                          retroflexion views. ?Complications:            No immediate complications. ?Estimated Blood Loss:     Estimated blood loss: none. ?Impression:               - One 2 mm polyp in the rectum, removed with a cold  ?  snare. Resected and retrieved. ?                          - One 5 mm polyp at the splenic flexure, removed  ?                          with a cold snare. Resected and retrieved. ?                          - One 4 mm polyp at the hepatic flexure, removed  ?                          with a cold snare. Resected and retrieved. ?                          - Erythematous mucosa in the cecum. Biopsied. ?                          - The remainder of the examined colonic mucosa and  ?                          distal terminal ileum were normal. ?Recommendation:           - Patient has a contact number available for  ?                          emergencies. The signs and symptoms of potential  ?                          delayed complications were discussed with the  ?                          patient. Return to normal activities tomorrow.  ?                          Written discharge instructions were provided to the  ?                          patient. ?                          - Resume previous diet. ?                          - Continue present medications. ?                          - Await pathology results. ?                          - Repeat colonoscopy date to be determined after  ?                          pending pathology results are reviewed for  ?  surveillance. ?                          - Emerging evidence supports eating a diet of  ?                          fruits, vegetables, grains, calcium, and yogurt  ?                          while reducing red meat and alcohol may  reduce the  ?                          risk of colon cancer. ?                          - Thank you for allowing me to be involved in your  ?                          colon cancer prevention. ?Thornton Park MD, MD ?09/14/2021 3:23:29 PM ?This report has been signed electronically. ?

## 2021-09-14 NOTE — Patient Instructions (Signed)
Please read handouts provided. Continue present medications. Await pathology results.   YOU HAD AN ENDOSCOPIC PROCEDURE TODAY AT THE [facility]:   Refer to the procedure report that was given to you for any specific questions about what was found during the examination.  If the procedure report does not answer your questions, please call your gastroenterologist to clarify.  If you requested that your care partner not be given the details of your procedure findings, then the procedure report has been included in a sealed envelope for you to review at your convenience later.  YOU SHOULD EXPECT: Some feelings of bloating in the abdomen. Passage of more gas than usual.  Walking can help get rid of the air that was put into your GI tract during the procedure and reduce the bloating. If you had a lower endoscopy (such as a colonoscopy or flexible sigmoidoscopy) you may notice spotting of blood in your stool or on the toilet paper. If you underwent a bowel prep for your procedure, you may not have a normal bowel movement for a few days.  Please Note:  You might notice some irritation and congestion in your nose or some drainage.  This is from the oxygen used during your procedure.  There is no need for concern and it should clear up in a day or so.  SYMPTOMS TO REPORT IMMEDIATELY:  Following lower endoscopy (colonoscopy or flexible sigmoidoscopy):  Excessive amounts of blood in the stool  Significant tenderness or worsening of abdominal pains  Swelling of the abdomen that is new, acute  Fever of 100F or higher   For urgent or emergent issues, a gastroenterologist can be reached at any hour by calling [phone]. Do not use MyChart messaging for urgent concerns.    DIET:  We do recommend a small meal at first, but then you may proceed to your regular diet.  Drink plenty of fluids but you should avoid alcoholic beverages for 24 hours.  ACTIVITY:  You should plan to take it easy  for the rest of today and you should NOT DRIVE or use heavy machinery until tomorrow (because of the sedation medicines used during the test).    FOLLOW UP: Our staff will call the number listed on your records 48-72 hours following your procedure to check on you and address any questions or concerns that you may have regarding the information given to you following your procedure. If we do not reach you, we will leave a message.  We will attempt to reach you two times.  During this call, we will ask if you have developed any symptoms of COVID 19. If you develop any symptoms (ie: fever, flu-like symptoms, shortness of breath, cough etc.) before then, please call [phone].  If you test positive for Covid 19 in the 2 weeks post procedure, please call and report this information to us.    If any biopsies were taken you will be contacted by phone or by letter within the next 1-3 weeks.  Please call us at [phone] if you have not heard about the biopsies in 3 weeks.    SIGNATURES/CONFIDENTIALITY: You and/or your care partner have signed paperwork which will be entered into your electronic medical record.  These signatures attest to the fact that that the information above on your After Visit Summary has been reviewed and is understood.  Full responsibility of the confidentiality of this discharge information lies with you and/or your care-partner.  

## 2021-09-14 NOTE — Progress Notes (Signed)
? ?Referring Provider: Lorrene Reid, PA-C ?Primary Care Physician:  Lorrene Reid, PA-C ? ?Reason for Procedure:  Colon cancer screening ? ? ?IMPRESSION:  ?Need for colon cancer screening ?Appropriate candidate for monitored anesthesia care ? ?PLAN: ?Colonoscopy in the [place] today ? ? ?HPI: Russell Robles is a 61 y.o. male presents for screening colonoscopy. ? ?Prior colonoscopy at age 10 that he says was normal. ? ?No baseline GI symptoms.  ? ?No known family history of colon cancer or polyps. No family history of uterine/endometrial cancer, pancreatic cancer or gastric/stomach cancer. ? ? ?Past Medical History:  ?Diagnosis Date  ? Anxiety   ? Arthritis   ? Cough   ? PER PT NON-PRODUCTIVE , NO FEVER OR CONGESTION  ? Hernia, inguinal, right   ? Hyperlipidemia   ? Hypertension   ? Type 2 diabetes mellitus ([place])   ? last  HbA1c 7.2 on 05-09-2017 in epic  ? ? ?Past Surgical History:  ?Procedure Laterality Date  ? CARPAL TUNNEL RELEASE Left 08/02/2018  ? Procedure: CARPAL TUNNEL RELEASE;  Surgeon: Thornton Park, MD;  Location: [facility];  Service: Orthopedics;  Laterality: Left;  ? HERNIA REPAIR N/A   ? [employer] 05/18/2020  ? INGUINAL HERNIA REPAIR Left 12-16-2010  dr Donne Hazel  [facility]  ? INGUINAL HERNIA REPAIR Right 06/01/2017  ? Procedure: OPEN RIGHT INGUINAL HERNIA REPAIR WITH MESH;  Surgeon: Kinsinger, Arta Bruce, MD;  Location: [place];  Service: General;  Laterality: Right;  GENERAL AND TAP BLOCK  ? INSERTION OF MESH Right 06/01/2017  ? Procedure: INSERTION OF MESH;  Surgeon: Kinsinger, Arta Bruce, MD;  Location: [facility];  Service: General;  Laterality: Right;  GENERAL AND TAP BLOCK  ? ? ?Current Outpatient Medications  ?Medication Sig Dispense Refill  ? amLODipine-valsartan (EXFORGE) 5-160 MG tablet TAKE 1 TABLET BY MOUTH DAILY. (TAKE WITH HCTZ 12.5 MG TABLET) 90 tablet 1  ? Cholecalciferol (VITAMIN D3) 125 MCG (5000 UT) TABS 5,000 IU OTC vitamin D3 daily. 90 tablet 3   ? CINNAMON PO Take 2 capsules by mouth daily.    ? Cyanocobalamin (VITAMIN B12 PO) Take 1 tablet by mouth daily.    ? hydrochlorothiazide (HYDRODIURIL) 12.5 MG tablet TAKE 1 TABLET BY MOUTH DAILY. (TAKE WITH AMLODIPINE-VALSARTAN) 90 tablet 1  ? meclizine (ANTIVERT) 12.5 MG tablet Take 1 tablet (12.5 mg total) by mouth 2 (two) times daily as needed for dizziness. 30 tablet 0  ? metFORMIN (GLUCOPHAGE) 500 MG tablet TAKE 2 TABLETS BY MOUTH IN THE MORNING AND 3 TABLETS IN THE EVENING 450 tablet 1  ? metoprolol succinate (TOPROL-XL) 50 MG 24 hr tablet TAKE 1 TABLET (50 MG TOTAL) BY MOUTH DAILY. TAKE WITH OR IMMEDIATELY FOLLOWING A MEAL. 90 tablet 0  ? Semaglutide,0.25 or 0.'5MG'$ /DOS, (OZEMPIC, 0.25 OR 0.5 MG/DOSE,) 2 MG/1.5ML SOPN INJECT 0.5 MG INTO THE SKIN ONCE WEEKLY 8 mL 1  ? simvastatin (ZOCOR) 80 MG tablet TAKE 1 TABLET BY MOUTH AT BEDTIME. 90 tablet 1  ? JARDIANCE 25 MG TABS tablet TAKE 1 TABLET (25 MG TOTAL) BY MOUTH DAILY. (Patient not taking: Reported on 09/14/2021) 30 tablet 4  ? methylPREDNISolone (MEDROL) 4 MG TBPK tablet Take by mouth as directed for 6 days (Patient not taking: Reported on 08/24/2021) 21 tablet 0  ? triamcinolone ointment (KENALOG) 0.1 % Apply 1 application topically 2 (two) times daily. (Patient not taking: Reported on 08/24/2021) 80 g 2  ? valACYclovir (VALTREX) 1000 MG tablet Take 1 tablet (1,000 mg total) by  mouth 2 (two) times daily. (Patient not taking: Reported on 08/24/2021) 20 tablet 0  ? ?Current Facility-Administered Medications  ?Medication Dose Route Frequency Provider Last Rate Last Admin  ? 0.9 %  sodium chloride infusion  500 mL Intravenous Once Thornton Park, MD      ? ? ?Allergies as of 09/14/2021  ? (No Known Allergies)  ? ? ?Family History  ?Problem Relation Age of Onset  ? COPD Mother   ? Aortic aneurysm Father   ? Lung cancer Sister   ? Colon cancer Neg Hx   ? Colon polyps Neg Hx   ? Esophageal cancer Neg Hx   ? Rectal cancer Neg Hx   ? Stomach cancer Neg Hx    ? ? ? ?Physical Exam: ?General:   Alert,  well-nourished, pleasant and cooperative in NAD ?Head:  Normocephalic and atraumatic. ?Eyes:  Sclera clear, no icterus.   Conjunctiva pink. ?Mouth:  No deformity or lesions.   ?Neck:  Supple; no masses or thyromegaly. ?Lungs:  Clear throughout to auscultation.   No wheezes. ?Heart:  Regular rate and rhythm; no murmurs. ?Abdomen:  Soft, non-tender, nondistended, normal bowel sounds, no rebound or guarding.  ?Msk:  Symmetrical. No boney deformities ?LAD: No inguinal or umbilical LAD ?Extremities:  No clubbing or edema. ?Neurologic:  Alert and  oriented x4;  grossly nonfocal ?Skin:  No obvious rash or bruise. ?Psych:  Alert and cooperative. Normal mood and affect. ? ? ? ? ?Studies/Results: ?No results found. ? ? ? ?[name] L. Tarri Glenn, MD, MPH ?09/14/2021, 2:50 PM ? ? ? ?  ?

## 2021-09-14 NOTE — Progress Notes (Signed)
VS-CW  Pt's states no medical or surgical changes since previsit or office visit.  

## 2021-09-14 NOTE — Progress Notes (Signed)
Report to PACU, RN, vss, BBS= Clear.  

## 2021-09-16 ENCOUNTER — Telehealth: Payer: Self-pay

## 2021-09-16 NOTE — Telephone Encounter (Signed)
?  Follow up Call- ? ? ?  09/14/2021  ?  1:45 PM  ?Call back number  ?Post procedure Call Back phone  # [phone]  ?Permission to leave phone message Yes  ?  ? ?Patient questions: ? ?Do you have a fever, pain , or abdominal swelling? No. ?Pain Score  0 * ? ?Have you tolerated food without any problems? Yes.   ? ?Have you been able to return to your normal activities? Yes.   ? ?Do you have any questions about your discharge instructions: ?Diet   No. ?Medications  No. ?Follow up visit  No. ? ?Do you have questions or concerns about your Care? No. ? ?Actions: ?* If pain score is 4 or above: ?No action needed, pain <4. ? ? ?

## 2021-10-06 ENCOUNTER — Encounter: Payer: Self-pay | Admitting: Gastroenterology

## 2021-10-12 NOTE — Progress Notes (Signed)
?Established patient visit ? ? ?Patient: Russell Robles   DOB: Jan 18, 1961   61 y.o. Male  MRN: [id number] ?Visit Date: 10/13/2021 ? ?Chief Complaint  ?Patient presents with  ? Follow-up  ? Diabetes  ? Hyperlipidemia  ? Hypertension  ? ?Subjective  ?  ?HPI  ?Patient presents for chronic f/up.  ? ?Diabetes: Pt denies increased urination or thirst. Pt reports medication compliance. No hypoglycemic events. Checking glucose at home. FBS range from 100-143. Patient reports cut down on starches and sugar. Eating less.  ? ?HTN: Pt denies chest pain, palpitations, dizziness or leg swelling. Taking medication as directed without side effects. ? ?HLD: Pt taking medication as directed without issues. Denies side effects including myalgias and RUQ pain.  ? ? ?Medications: ?Outpatient Medications Prior to Visit  ?Medication Sig  ? amLODipine-valsartan (EXFORGE) 5-160 MG tablet TAKE 1 TABLET BY MOUTH DAILY. (TAKE WITH HCTZ 12.5 MG TABLET)  ? Cholecalciferol (VITAMIN D3) 125 MCG (5000 UT) TABS 5,000 IU OTC vitamin D3 daily.  ? CINNAMON PO Take 2 capsules by mouth daily.  ? Cyanocobalamin (VITAMIN B12 PO) Take 1 tablet by mouth daily.  ? hydrochlorothiazide (HYDRODIURIL) 12.5 MG tablet TAKE 1 TABLET BY MOUTH DAILY. (TAKE WITH AMLODIPINE-VALSARTAN)  ? JARDIANCE 25 MG TABS tablet TAKE 1 TABLET (25 MG TOTAL) BY MOUTH DAILY.  ? meclizine (ANTIVERT) 12.5 MG tablet Take 1 tablet (12.5 mg total) by mouth 2 (two) times daily as needed for dizziness.  ? metFORMIN (GLUCOPHAGE) 500 MG tablet TAKE 2 TABLETS BY MOUTH IN THE MORNING AND 3 TABLETS IN THE EVENING  ? methylPREDNISolone (MEDROL) 4 MG TBPK tablet Take by mouth as directed for 6 days  ? metoprolol succinate (TOPROL-XL) 50 MG 24 hr tablet TAKE 1 TABLET (50 MG TOTAL) BY MOUTH DAILY. TAKE WITH OR IMMEDIATELY FOLLOWING A MEAL.  ? Semaglutide,0.25 or 0.'5MG'$ /DOS, (OZEMPIC, 0.25 OR 0.5 MG/DOSE,) 2 MG/1.5ML SOPN INJECT 0.5 MG INTO THE SKIN ONCE WEEKLY  ? simvastatin (ZOCOR) 80 MG tablet  TAKE 1 TABLET BY MOUTH AT BEDTIME.  ? triamcinolone ointment (KENALOG) 0.1 % Apply 1 application topically 2 (two) times daily.  ? valACYclovir (VALTREX) 1000 MG tablet Take 1 tablet (1,000 mg total) by mouth 2 (two) times daily.  ? ?No facility-administered medications prior to visit.  ? ? ?Review of Systems ?Review of Systems:  ?A fourteen system review of systems was performed and found to be positive as per HPI. ? ? ? ?  Objective  ?  ?BP 128/78   Pulse (!) 48   Temp (!) 97.5 ?F (36.4 ?C)   Ht '5\' 8"'$  (1.727 m)   Wt 200 lb (90.7 kg)   SpO2 98%   BMI 30.41 kg/m?  ?BP Readings from Last 3 Encounters:  ?10/13/21 128/78  ?09/14/21 110/80  ?06/17/21 118/73  ? ?Wt Readings from Last 3 Encounters:  ?10/13/21 200 lb (90.7 kg)  ?09/14/21 200 lb (90.7 kg)  ?08/24/21 200 lb (90.7 kg)  ? ? ?Physical Exam  ?General:  Well Developed, well nourished, appropriate for stated age.  ?Neuro:  Alert and oriented,  extra-ocular muscles intact  ?HEENT:  Normocephalic, atraumatic, neck supple  ?Skin:  no gross rash, warm, pink. ?Cardiac:  RRR, S1 S2, +systolic murmur ?Respiratory: CTA B/L  ?Vascular:  Ext warm, no cyanosis apprec.; cap RF less 2 sec. ?Psych:  No HI/SI, judgement and insight good, Euthymic mood. Full Affect. ? ? ?Results for orders placed or performed in visit on 10/13/21  ?POCT UA - Microalbumin  ?  Result Value Ref Range  ? Microalbumin Ur, POC 80 mg/L  ? Creatinine, POC 100 mg/dL  ? Albumin/Creatinine Ratio, Urine, POC 30-300   ?POCT glycosylated hemoglobin (Hb A1C)  ?Result Value Ref Range  ? Hemoglobin A1C 6.1 (A) 4.0 - 5.6 %  ? HbA1c POC (<> result, manual entry)    ? HbA1c, POC (prediabetic range)    ? HbA1c, POC (controlled diabetic range)    ? ? Assessment & Plan  ?  ? ? ?Problem List Items Addressed This Visit   ? ?  ? Cardiovascular and Mediastinum  ? Hypertension associated with diabetes ([place]) (Chronic)  ?  -BP elevated on intake. BP repeated and improved. ?-Will continue current medication regimen, see  med list. ?-Will collect CMP at f/up visit for medication monitoring. ?-Will continue to monitor. ? ?  ?  ?  ? Endocrine  ? Diabetes mellitus type 2 with complications ([name]) - Primary (Chronic)  ?  -A1c has improved fro 6.5 to 6.1, will continue current medication regimen. Recommend to continue with diet changes. Continue ambulatory glucose monitoring and recommend to monitor for hypoglycemia. ?-UA microalbumin collected, A:C 30-300 mg/g. Patient on ARB therapy.  ? ?  ?  ? Relevant Orders  ? POCT UA - Microalbumin (Completed)  ? POCT glycosylated hemoglobin (Hb A1C) (Completed)  ? Hyperlipidemia associated with type 2 diabetes mellitus ([name]) (Chronic)  ?  -Last lipid panel: HDL 37, LDL 61 ?-Continue current medication regimen. ?-Will repeat lipid panel and hepatic function at f/up visit. ? ? ?  ?  ? ?Other Visit Diagnoses   ? ? Need for shingles vaccine      ? Relevant Orders  ? Varicella-zoster vaccine IM  ? ?  ? ? ?Return in about 4 months (around 02/12/2022) for DM, HTN, HLD, second shingles dose and FBW (lipid panel, cmp, a1c).  ?   ? ? ? ?Lorrene Reid, PA-C  ?[facility] ?[phone] (phone) ?[phone] (fax) ? ? Medical Group ?

## 2021-10-13 ENCOUNTER — Encounter: Payer: Self-pay | Admitting: Physician Assistant

## 2021-10-13 ENCOUNTER — Ambulatory Visit (INDEPENDENT_AMBULATORY_CARE_PROVIDER_SITE_OTHER): Payer: BC Managed Care – PPO | Admitting: Physician Assistant

## 2021-10-13 VITALS — BP 128/78 | HR 48 | Temp 97.5°F | Ht 68.0 in | Wt 200.0 lb

## 2021-10-13 DIAGNOSIS — E785 Hyperlipidemia, unspecified: Secondary | ICD-10-CM

## 2021-10-13 DIAGNOSIS — E1159 Type 2 diabetes mellitus with other circulatory complications: Secondary | ICD-10-CM

## 2021-10-13 DIAGNOSIS — E118 Type 2 diabetes mellitus with unspecified complications: Secondary | ICD-10-CM

## 2021-10-13 DIAGNOSIS — I152 Hypertension secondary to endocrine disorders: Secondary | ICD-10-CM

## 2021-10-13 DIAGNOSIS — E1169 Type 2 diabetes mellitus with other specified complication: Secondary | ICD-10-CM | POA: Diagnosis not present

## 2021-10-13 DIAGNOSIS — Z23 Encounter for immunization: Secondary | ICD-10-CM | POA: Diagnosis not present

## 2021-10-13 LAB — POCT UA - MICROALBUMIN
Creatinine, POC: 100 mg/dL
Microalbumin Ur, POC: 80 mg/L

## 2021-10-13 LAB — POCT GLYCOSYLATED HEMOGLOBIN (HGB A1C): Hemoglobin A1C: 6.1 % — AB (ref 4.0–5.6)

## 2021-10-13 NOTE — Patient Instructions (Signed)

## 2021-10-13 NOTE — Assessment & Plan Note (Addendum)
-  A1c has improved fro 6.5 to 6.1, will continue current medication regimen. Recommend to continue with diet changes. Continue ambulatory glucose monitoring and recommend to monitor for hypoglycemia. ?-UA microalbumin collected, A:C 30-300 mg/g. Patient on ARB therapy.  ?

## 2021-10-13 NOTE — Assessment & Plan Note (Signed)
-  Last lipid panel: HDL 37, LDL 61 ?-Continue current medication regimen. ?-Will repeat lipid panel and hepatic function at f/up visit. ? ?

## 2021-10-13 NOTE — Assessment & Plan Note (Signed)
-  BP elevated on intake. BP repeated and improved. ?-Will continue current medication regimen, see med list. ?-Will collect CMP at f/up visit for medication monitoring. ?-Will continue to monitor. ?

## 2021-11-02 DIAGNOSIS — I10 Essential (primary) hypertension: Secondary | ICD-10-CM | POA: Diagnosis not present

## 2021-11-02 DIAGNOSIS — E876 Hypokalemia: Secondary | ICD-10-CM | POA: Diagnosis not present

## 2021-11-02 DIAGNOSIS — W230XXA Caught, crushed, jammed, or pinched between moving objects, initial encounter: Secondary | ICD-10-CM | POA: Diagnosis not present

## 2021-11-02 DIAGNOSIS — S6721XA Crushing injury of right hand, initial encounter: Secondary | ICD-10-CM | POA: Diagnosis not present

## 2021-11-02 DIAGNOSIS — Y92810 Car as the place of occurrence of the external cause: Secondary | ICD-10-CM | POA: Diagnosis not present

## 2021-11-02 DIAGNOSIS — M79641 Pain in right hand: Secondary | ICD-10-CM | POA: Diagnosis not present

## 2021-11-02 DIAGNOSIS — Z79899 Other long term (current) drug therapy: Secondary | ICD-10-CM | POA: Diagnosis not present

## 2021-11-02 DIAGNOSIS — Z5181 Encounter for therapeutic drug level monitoring: Secondary | ICD-10-CM | POA: Diagnosis not present

## 2021-11-02 DIAGNOSIS — Z87891 Personal history of nicotine dependence: Secondary | ICD-10-CM | POA: Diagnosis not present

## 2021-11-02 DIAGNOSIS — E119 Type 2 diabetes mellitus without complications: Secondary | ICD-10-CM | POA: Diagnosis not present

## 2021-11-18 DIAGNOSIS — S62642D Nondisplaced fracture of proximal phalanx of right middle finger, subsequent encounter for fracture with routine healing: Secondary | ICD-10-CM | POA: Diagnosis not present

## 2021-11-18 DIAGNOSIS — R936 Abnormal findings on diagnostic imaging of limbs: Secondary | ICD-10-CM | POA: Diagnosis not present

## 2021-11-18 DIAGNOSIS — X58XXXD Exposure to other specified factors, subsequent encounter: Secondary | ICD-10-CM | POA: Diagnosis not present

## 2021-11-18 DIAGNOSIS — M79641 Pain in right hand: Secondary | ICD-10-CM | POA: Insufficient documentation

## 2021-11-19 ENCOUNTER — Other Ambulatory Visit: Payer: Self-pay | Admitting: Physician Assistant

## 2021-11-19 DIAGNOSIS — I152 Hypertension secondary to endocrine disorders: Secondary | ICD-10-CM

## 2021-12-15 ENCOUNTER — Other Ambulatory Visit: Payer: Self-pay | Admitting: Physician Assistant

## 2021-12-15 DIAGNOSIS — E118 Type 2 diabetes mellitus with unspecified complications: Secondary | ICD-10-CM

## 2022-02-11 ENCOUNTER — Ambulatory Visit (INDEPENDENT_AMBULATORY_CARE_PROVIDER_SITE_OTHER): Payer: BC Managed Care – PPO | Admitting: Physician Assistant

## 2022-02-11 ENCOUNTER — Encounter: Payer: Self-pay | Admitting: Physician Assistant

## 2022-02-11 VITALS — BP 181/89 | HR 63 | Temp 97.7°F | Ht 68.0 in | Wt 210.0 lb

## 2022-02-11 DIAGNOSIS — Z23 Encounter for immunization: Secondary | ICD-10-CM

## 2022-02-11 DIAGNOSIS — I152 Hypertension secondary to endocrine disorders: Secondary | ICD-10-CM | POA: Diagnosis not present

## 2022-02-11 DIAGNOSIS — E118 Type 2 diabetes mellitus with unspecified complications: Secondary | ICD-10-CM | POA: Diagnosis not present

## 2022-02-11 DIAGNOSIS — E559 Vitamin D deficiency, unspecified: Secondary | ICD-10-CM

## 2022-02-11 DIAGNOSIS — E1169 Type 2 diabetes mellitus with other specified complication: Secondary | ICD-10-CM

## 2022-02-11 DIAGNOSIS — E785 Hyperlipidemia, unspecified: Secondary | ICD-10-CM | POA: Diagnosis not present

## 2022-02-11 DIAGNOSIS — E1159 Type 2 diabetes mellitus with other circulatory complications: Secondary | ICD-10-CM | POA: Diagnosis not present

## 2022-02-11 LAB — POCT GLYCOSYLATED HEMOGLOBIN (HGB A1C): Hemoglobin A1C: 5.9 % — AB (ref 4.0–5.6)

## 2022-02-11 MED ORDER — HYDROCHLOROTHIAZIDE 12.5 MG PO TABS: ORAL_TABLET | ORAL | 1 refills | Status: DC

## 2022-02-11 MED ORDER — SIMVASTATIN 80 MG PO TABS: 80.0000 mg | ORAL_TABLET | Freq: Every day | ORAL | 1 refills | Status: DC

## 2022-02-11 MED ORDER — METOPROLOL SUCCINATE ER 50 MG PO TB24: 50.0000 mg | ORAL_TABLET | Freq: Every day | ORAL | 1 refills | Status: DC

## 2022-02-11 MED ORDER — AMLODIPINE BESYLATE-VALSARTAN 5-160 MG PO TABS: ORAL_TABLET | ORAL | 1 refills | Status: DC

## 2022-02-11 NOTE — Assessment & Plan Note (Signed)
-  BP elevated likely secondary to being without antihypertensive medications. Provided refills today. Will repeat BP at f/up visit and reassess medication therapy.
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
## 2022-02-11 NOTE — Assessment & Plan Note (Signed)
-  Will collect lipid panel today and provided refill for simvastatin 80 mg daily to resume. Recommend to follow a low fat diet. Will continue to monitor.

## 2022-02-11 NOTE — Patient Instructions (Signed)
Managing Your Hypertension Hypertension, also called high blood pressure, is when the force of the blood pressing against the walls of the arteries is too strong. Arteries are blood vessels that carry blood from your heart throughout your body. Hypertension forces the heart to work harder to pump blood and may cause the arteries to become narrow or stiff. Understanding blood pressure readings A blood pressure reading includes a higher number over a lower number: The first, or top, number is called the systolic pressure. It is a measure of the pressure in your arteries as your heart beats. The second, or bottom number, is called the diastolic pressure. It is a measure of the pressure in your arteries as the heart relaxes. For most people, a normal blood pressure is below 120/80. Your personal target blood pressure may vary depending on your medical conditions, your age, and other factors. Blood pressure is classified into four stages. Based on your blood pressure reading, your health care provider may use the following stages to determine what type of treatment you need, if any. Systolic pressure and diastolic pressure are measured in a unit called millimeters of mercury (mmHg). Normal Systolic pressure: below 120. Diastolic pressure: below 80. Elevated Systolic pressure: 120-129. Diastolic pressure: below 80. Hypertension stage 1 Systolic pressure: 130-139. Diastolic pressure: 80-89. Hypertension stage 2 Systolic pressure: 140 or above. Diastolic pressure: 90 or above. How can this condition affect me? Managing your hypertension is very important. Over time, hypertension can damage the arteries and decrease blood flow to parts of the body, including the brain, heart, and kidneys. Having untreated or uncontrolled hypertension can lead to: A heart attack. A stroke. A weakened blood vessel (aneurysm). Heart failure. Kidney damage. Eye damage. Memory and concentration problems. Vascular  dementia. What actions can I take to manage this condition? Hypertension can be managed by making lifestyle changes and possibly by taking medicines. Your health care provider will help you make a plan to bring your blood pressure within a normal range. You may be referred for counseling on a healthy diet and physical activity. Nutrition  Eat a diet that is high in fiber and potassium, and low in salt (sodium), added sugar, and fat. An example eating plan is called the DASH diet. DASH stands for Dietary Approaches to Stop Hypertension. To eat this way: Eat plenty of fresh fruits and vegetables. Try to fill one-half of your plate at each meal with fruits and vegetables. Eat whole grains, such as whole-wheat pasta, brown rice, or whole-grain bread. Fill about one-fourth of your plate with whole grains. Eat low-fat dairy products. Avoid fatty cuts of meat, processed or cured meats, and poultry with skin. Fill about one-fourth of your plate with lean proteins such as fish, chicken without skin, beans, eggs, and tofu. Avoid pre-made and processed foods. These tend to be higher in sodium, added sugar, and fat. Reduce your daily sodium intake. Many people with hypertension should eat less than 1,500 mg of sodium a day. Lifestyle  Work with your health care provider to maintain a healthy body weight or to lose weight. Ask what an ideal weight is for you. Get at least 30 minutes of exercise that causes your heart to beat faster (aerobic exercise) most days of the week. Activities may include walking, swimming, or biking. Include exercise to strengthen your muscles (resistance exercise), such as weight lifting, as part of your weekly exercise routine. Try to do these types of exercises for 30 minutes at least 3 days a week. Do   not use any products that contain nicotine or tobacco. These products include cigarettes, chewing tobacco, and vaping devices, such as e-cigarettes. If you need help quitting, ask your  health care provider. Control any long-term (chronic) conditions you have, such as high cholesterol or diabetes. Identify your sources of stress and find ways to manage stress. This may include meditation, deep breathing, or making time for fun activities. Alcohol use Do not drink alcohol if: Your health care provider tells you not to drink. You are pregnant, may be pregnant, or are planning to become pregnant. If you drink alcohol: Limit how much you have to: 0-1 drink a day for women. 0-2 drinks a day for men. Know how much alcohol is in your drink. In the [place] one drink equals one 12 oz bottle of beer (355 mL), one 5 oz glass of wine (148 mL), or one 1 oz glass of hard liquor (44 mL). Medicines Your health care provider may prescribe medicine if lifestyle changes are not enough to get your blood pressure under control and if: Your systolic blood pressure is 130 or higher. Your diastolic blood pressure is 80 or higher. Take medicines only as told by your health care provider. Follow the directions carefully. Blood pressure medicines must be taken as told by your health care provider. The medicine does not work as well when you skip doses. Skipping doses also puts you at risk for problems. Monitoring Before you monitor your blood pressure: Do not smoke, drink caffeinated beverages, or exercise within 30 minutes before taking a measurement. Use the bathroom and empty your bladder (urinate). Sit quietly for at least 5 minutes before taking measurements. Monitor your blood pressure at home as told by your health care provider. To do this: Sit with your back straight and supported. Place your feet flat on the floor. Do not cross your legs. Support your arm on a flat surface, such as a table. Make sure your upper arm is at heart level. Each time you measure, take two or three readings one minute apart and record the results. You may also need to have your blood pressure checked regularly by  your health care provider. General information Talk with your health care provider about your diet, exercise habits, and other lifestyle factors that may be contributing to hypertension. Review all the medicines you take with your health care provider because there may be side effects or interactions. Keep all follow-up visits. Your health care provider can help you create and adjust your plan for managing your high blood pressure. Where to find more information National Heart, Lung, and Blood Institute: www.nhlbi.nih.gov American Heart Association: www.heart.org Contact a health care provider if: You think you are having a reaction to medicines you have taken. You have repeated (recurrent) headaches. You feel dizzy. You have swelling in your ankles. You have trouble with your vision. Get help right away if: You develop a severe headache or confusion. You have unusual weakness or numbness, or you feel faint. You have severe pain in your chest or abdomen. You vomit repeatedly. You have trouble breathing. These symptoms may be an emergency. Get help right away. Call 911. Do not wait to see if the symptoms will go away. Do not drive yourself to the hospital. Summary Hypertension is when the force of blood pumping through your arteries is too strong. If this condition is not controlled, it may put you at risk for serious complications. Your personal target blood pressure may vary depending on your medical conditions,   your age, and other factors. For most people, a normal blood pressure is less than 120/80. Hypertension is managed by lifestyle changes, medicines, or both. Lifestyle changes to help manage hypertension include losing weight, eating a healthy, low-sodium diet, exercising more, stopping smoking, and limiting alcohol. This information is not intended to replace advice given to you by your health care provider. Make sure you discuss any questions you have with your health care  provider. Document Revised: 02/18/2021 Document Reviewed: 02/18/2021 Elsevier Patient Education  2023 Elsevier Inc.  

## 2022-02-11 NOTE — Assessment & Plan Note (Signed)
-  A1c today 5.9 which improved from prior at 6.1. Patient reports has only been taking Ozempic 0.5 mg once a week so will discontinue Metformin and Jardiance 25 mg. Advised will repeat A1c in 3 months if A1c changes or increased then recommend resuming Jardiance 25 mg. Pt verbalized understanding. Recommend to continue ambulatory glucose monitoring and follow a diabetic diet.

## 2022-02-12 LAB — COMPREHENSIVE METABOLIC PANEL
ALT: 17 IU/L (ref 0–44)
AST: 14 IU/L (ref 0–40)
Albumin/Globulin Ratio: 1.7 (ref 1.2–2.2)
Albumin: 4.5 g/dL (ref 3.8–4.9)
Alkaline Phosphatase: 74 IU/L (ref 44–121)
BUN/Creatinine Ratio: 11 (ref 10–24)
BUN: 11 mg/dL (ref 8–27)
Bilirubin Total: 0.6 mg/dL (ref 0.0–1.2)
CO2: 21 mmol/L (ref 20–29)
Calcium: 9.6 mg/dL (ref 8.6–10.2)
Chloride: 102 mmol/L (ref 96–106)
Creatinine, Ser: 0.96 mg/dL (ref 0.76–1.27)
Globulin, Total: 2.7 g/dL (ref 1.5–4.5)
Glucose: 135 mg/dL — ABNORMAL HIGH (ref 70–99)
Potassium: 3.8 mmol/L (ref 3.5–5.2)
Sodium: 139 mmol/L (ref 134–144)
Total Protein: 7.2 g/dL (ref 6.0–8.5)
eGFR: 90 mL/min/{1.73_m2} (ref >59)

## 2022-02-12 LAB — LIPID PANEL
Chol/HDL Ratio: 5.1 ratio — ABNORMAL HIGH (ref 0.0–5.0)
Cholesterol, Total: 215 mg/dL — ABNORMAL HIGH (ref 100–199)
HDL: 42 mg/dL (ref >39)
LDL Chol Calc (NIH): 142 mg/dL — ABNORMAL HIGH (ref 0–99)
Triglycerides: 174 mg/dL — ABNORMAL HIGH (ref 0–149)
VLDL Cholesterol Cal: 31 mg/dL (ref 5–40)

## 2022-02-12 LAB — CBC
Hematocrit: 44.9 % (ref 37.5–51.0)
Hemoglobin: 15.2 g/dL (ref 13.0–17.7)
MCH: 29.2 pg (ref 26.6–33.0)
MCHC: 33.9 g/dL (ref 31.5–35.7)
MCV: 86 fL (ref 79–97)
Platelets: 266 10*3/uL (ref 150–450)
RBC: 5.21 x10E6/uL (ref 4.14–5.80)
RDW: 12.5 % (ref 11.6–15.4)
WBC: 9.5 10*3/uL (ref 3.4–10.8)

## 2022-02-12 LAB — TSH: TSH: 0.9 u[IU]/mL (ref 0.450–4.500)

## 2022-02-12 LAB — VITAMIN D 25 HYDROXY (VIT D DEFICIENCY, FRACTURES): Vit D, 25-Hydroxy: 30.6 ng/mL (ref 30.0–100.0)

## 2022-02-12 LAB — HEMOGLOBIN A1C
Est. average glucose Bld gHb Est-mCnc: 131 mg/dL
Hgb A1c MFr Bld: 6.2 % — ABNORMAL HIGH (ref 4.8–5.6)

## 2022-02-17 ENCOUNTER — Other Ambulatory Visit: Payer: Self-pay

## 2022-02-17 ENCOUNTER — Telehealth: Payer: Self-pay | Admitting: Nurse Practitioner

## 2022-02-17 DIAGNOSIS — E118 Type 2 diabetes mellitus with unspecified complications: Secondary | ICD-10-CM

## 2022-02-17 MED ORDER — OZEMPIC (0.25 OR 0.5 MG/DOSE) 2 MG/1.5ML ~~LOC~~ SOPN: PEN_INJECTOR | SUBCUTANEOUS | 1 refills | Status: DC

## 2022-02-17 NOTE — Telephone Encounter (Signed)
Rx was sent  

## 2022-02-17 NOTE — Telephone Encounter (Signed)
Patient is out of town and needs refill of Ozempic. He wants it called into [facility] in [place]. Please advise.

## 2022-05-17 ENCOUNTER — Encounter: Payer: Self-pay | Admitting: Nurse Practitioner

## 2022-05-17 ENCOUNTER — Ambulatory Visit: Payer: BC Managed Care – PPO | Admitting: Nurse Practitioner

## 2022-05-17 VITALS — BP 131/85 | HR 78 | Resp 18 | Ht 68.0 in | Wt 221.0 lb

## 2022-05-17 DIAGNOSIS — E785 Hyperlipidemia, unspecified: Secondary | ICD-10-CM

## 2022-05-17 DIAGNOSIS — E118 Type 2 diabetes mellitus with unspecified complications: Secondary | ICD-10-CM

## 2022-05-17 DIAGNOSIS — E1169 Type 2 diabetes mellitus with other specified complication: Secondary | ICD-10-CM

## 2022-05-17 DIAGNOSIS — Z23 Encounter for immunization: Secondary | ICD-10-CM

## 2022-05-17 DIAGNOSIS — E1159 Type 2 diabetes mellitus with other circulatory complications: Secondary | ICD-10-CM

## 2022-05-17 DIAGNOSIS — R079 Chest pain, unspecified: Secondary | ICD-10-CM | POA: Insufficient documentation

## 2022-05-17 DIAGNOSIS — I152 Hypertension secondary to endocrine disorders: Secondary | ICD-10-CM

## 2022-05-17 LAB — POCT GLYCOSYLATED HEMOGLOBIN (HGB A1C): HbA1c POC (<> result, manual entry): 8.2 % (ref 4.0–5.6)

## 2022-05-17 MED ORDER — SEMAGLUTIDE (1 MG/DOSE) 4 MG/3ML ~~LOC~~ SOPN: 1.0000 mg | PEN_INJECTOR | SUBCUTANEOUS | 3 refills | Status: DC

## 2022-05-17 NOTE — Progress Notes (Signed)
Established patient visit   Patient: Russell Robles   DOB: 09/10/1960   61 y.o. Male  MRN: [id number] Visit Date: 05/17/2022   Chief Complaint  Patient presents with   Follow-up    Fasting   Diabetes   Hypertension   Hyperlipidemia   Subjective    Diabetes Pertinent negatives for hypoglycemia include no dizziness, headaches or nervousness/anxiousness. Associated symptoms include chest pain. Pertinent negatives for diabetes include no fatigue, no polydipsia, no polyuria and no weakness.  Hypertension Associated symptoms include chest pain. Pertinent negatives include no headaches, palpitations or shortness of breath.  Hyperlipidemia Associated symptoms include chest pain. Pertinent negatives include no myalgias or shortness of breath.   HPI     Follow-up    Additional comments: Fasting      Last edited by Gemma Payor, CMA on 05/17/2022  8:26 AM.      Follow up visit -type 2 diabetes  --HgbA1c elevated significantly at 8.2 today.  --currently taking ozempic 0.5 mg weekly.  -he goes Monday for his yearly eye exam - Dr. Amalia Hailey in [facility], [place]  -blood pressure slightly elevated, however, this is mych improved since last visit.  Right chest pain  --describes this as "nagging" --initially felt shortness of breath.  -gets worse with bending over from side to side.  -most recent ECG done 08/09/2018 and was normal    Medications: Outpatient Medications Prior to Visit  Medication Sig Note   amLODipine-valsartan (EXFORGE) 5-160 MG tablet TAKE 1 TABLET BY MOUTH DAILY. (TAKE WITH HCTZ 12.5 MG TABLET)    Cholecalciferol (VITAMIN D3) 125 MCG (5000 UT) TABS 5,000 IU OTC vitamin D3 daily.    CINNAMON PO Take 2 capsules by mouth daily.    Cyanocobalamin (VITAMIN B12 PO) Take 1 tablet by mouth daily.    hydrochlorothiazide (HYDRODIURIL) 12.5 MG tablet TAKE 1 TABLET BY MOUTH DAILY. (TAKE WITH AMLODIPINE-VALSARTAN)    meclizine (ANTIVERT) 12.5 MG tablet Take 1 tablet (12.5 mg  total) by mouth 2 (two) times daily as needed for dizziness.    methylPREDNISolone (MEDROL) 4 MG TBPK tablet Take by mouth as directed for 6 days    metoprolol succinate (TOPROL-XL) 50 MG 24 hr tablet Take 1 tablet (50 mg total) by mouth daily. TAKE WITH OR IMMEDIATELY FOLLOWING A MEAL.    simvastatin (ZOCOR) 80 MG tablet Take 1 tablet (80 mg total) by mouth at bedtime.    triamcinolone ointment (KENALOG) 0.1 % Apply 1 application topically 2 (two) times daily.    valACYclovir (VALTREX) 1000 MG tablet Take 1 tablet (1,000 mg total) by mouth 2 (two) times daily.    [DISCONTINUED] Semaglutide,0.25 or 0.5MG/DOS, (OZEMPIC, 0.25 OR 0.5 MG/DOSE,) 2 MG/1.5ML SOPN INJECT 0.5 MG INTO THE SKIN ONCE WEEKLY 05/17/2022: increase dose   No facility-administered medications prior to visit.    Review of Systems  Constitutional:  Negative for activity change, chills, fatigue and fever.  HENT:  Negative for congestion, postnasal drip, rhinorrhea, sinus pressure, sinus pain, sneezing and sore throat.   Eyes: Negative.   Respiratory:  Negative for cough, shortness of breath and wheezing.   Cardiovascular:  Positive for chest pain. Negative for palpitations.       Right sided chest pain which has been present for a few weeks.   Gastrointestinal:  Negative for constipation, diarrhea, nausea and vomiting.  Endocrine: Negative for cold intolerance, heat intolerance, polydipsia and polyuria.       Blood sugars elevated since last visit. No longer taking metformin.  Genitourinary:  Negative for dysuria, frequency and urgency.  Musculoskeletal:  Negative for back pain and myalgias.  Skin:  Negative for rash.  Allergic/Immunologic: Negative for environmental allergies.  Neurological:  Negative for dizziness, weakness and headaches.  Psychiatric/Behavioral:  The patient is not nervous/anxious.     Last CBC Lab Results  Component Value Date   WBC 9.5 02/11/2022   HGB 15.2 02/11/2022   HCT 44.9 02/11/2022   MCV  86 02/11/2022   MCH 29.2 02/11/2022   RDW 12.5 02/11/2022   PLT 266 63/33/5456   Last metabolic panel Lab Results  Component Value Date   GLUCOSE 135 (H) 02/11/2022   NA 139 02/11/2022   K 3.8 02/11/2022   CL 102 02/11/2022   CO2 21 02/11/2022   BUN 11 02/11/2022   CREATININE 0.96 02/11/2022   EGFR 90 02/11/2022   CALCIUM 9.6 02/11/2022   PROT 7.2 02/11/2022   ALBUMIN 4.5 02/11/2022   LABGLOB 2.7 02/11/2022   AGRATIO 1.7 02/11/2022   BILITOT 0.6 02/11/2022   ALKPHOS 74 02/11/2022   AST 14 02/11/2022   ALT 17 02/11/2022   ANIONGAP 9 08/01/2018   Last lipids Lab Results  Component Value Date   CHOL 215 (H) 02/11/2022   HDL 42 02/11/2022   LDLCALC 142 (H) 02/11/2022   TRIG 174 (H) 02/11/2022   CHOLHDL 5.1 (H) 02/11/2022   Last hemoglobin A1c Lab Results  Component Value Date   HGBA1C 8.2 05/17/2022   Last thyroid functions Lab Results  Component Value Date   TSH 0.900 02/11/2022   T3TOTAL 132 03/07/2019   Last vitamin D Lab Results  Component Value Date   VD25OH 30.6 02/11/2022       Objective     Today's Vitals   05/17/22 0826 05/17/22 0905  BP: (Abnormal) 147/89 131/85  Pulse: 68 78  Resp: 18   SpO2: 95%   Weight: 221 lb (100.2 kg)   Height: _0  (1.727 m)    Body mass index is 33.6 kg/m.  BP Readings from Last 3 Encounters:  05/17/22 131/85  02/11/22 (Abnormal) 181/89  10/13/21 128/78    Wt Readings from Last 3 Encounters:  05/17/22 221 lb (100.2 kg)  02/11/22 210 lb (95.3 kg)  10/13/21 200 lb (90.7 kg)    Physical Exam Vitals and nursing note reviewed.  Constitutional:      Appearance: Normal appearance. He is well-developed.  HENT:     Head: Normocephalic.  Eyes:     Pupils: Pupils are equal, round, and reactive to light.  Cardiovascular:     Rate and Rhythm: Normal rate and regular rhythm.     Pulses: Normal pulses.     Heart sounds: Murmur heard.     Comments: Oft, blowing, systolic murmur, better heard on the right side  of the chest  -EKG is normal today  Pulmonary:     Effort: Pulmonary effort is normal.     Breath sounds: Normal breath sounds.  Abdominal:     Palpations: Abdomen is soft.  Musculoskeletal:        General: Normal range of motion.     Cervical back: Normal range of motion and neck supple.  Lymphadenopathy:     Cervical: No cervical adenopathy.  Skin:    General: Skin is warm and dry.     Capillary Refill: Capillary refill takes less than 2 seconds.  Neurological:     General: No focal deficit present.     Mental Status: He is alert  and oriented to person, place, and time.  Psychiatric:        Mood and Affect: Mood normal.        Behavior: Behavior normal.        Thought Content: Thought content normal.        Judgment: Judgment normal.      Results for orders placed or performed in visit on 05/17/22  POCT HgB A1C  Result Value Ref Range   Hemoglobin A1C     HbA1c POC (<> result, manual entry) 8.2 4.0 - 5.6 %   HbA1c, POC (prediabetic range)     HbA1c, POC (controlled diabetic range)      Assessment & Plan    1. Diabetes mellitus type 2 with complications ([name]) [id number] 8.2 today. Increase ozempic to 1 mg weekly. Encouraged him to limit carbohydrates and sugar in the diet and io increase water intake and exercise. Recheck in three months  - POCT HgB A1C - Semaglutide, 1 MG/DOSE, 4 MG/3ML SOPN; Inject 1 mg as directed once a week.  Dispense: 3 mL; Refill: 3  2. Chest pain, unspecified type Believe this to be musculoskeletal. ECG done in office today and is within normal limits  - EKG 12-Lead  3. Hypertension associated with diabetes ([place]) Stable. Continue bp medication as prescribed   4. Hyperlipidemia associated with type 2 diabetes mellitus ([place]) Continue simvastatin as prescribed   5. Need for influenza vaccination Flu vaccine administered during today's visit.  - Flu Vaccine QUAD 6+ mos PF IM (Fluarix Quad PF)    Problem List Items Addressed This Visit        Cardiovascular and Mediastinum   Hypertension associated with diabetes ([place]) (Chronic)   Relevant Medications   Semaglutide, 1 MG/DOSE, 4 MG/3ML SOPN     Endocrine   Diabetes mellitus type 2 with complications ([name]) - Primary (Chronic)   Relevant Medications   Semaglutide, 1 MG/DOSE, 4 MG/3ML SOPN   Other Relevant Orders   POCT HgB A1C (Completed)   Hyperlipidemia associated with type 2 diabetes mellitus ([name]) (Chronic)   Relevant Medications   Semaglutide, 1 MG/DOSE, 4 MG/3ML SOPN     Other   Chest pain   Relevant Orders   EKG 12-Lead   Other Visit Diagnoses     Need for influenza vaccination       Relevant Orders   Flu Vaccine QUAD 6+ mos PF IM (Fluarix Quad PF) (Completed)        Return in about 3 months (around 08/17/2022) for diabetes with HgbA1c check - increased ozempic.         Ronnell Freshwater, NP  [facility] [phone] (phone) [phone] (fax)  [place]

## 2022-05-23 LAB — HM DIABETES EYE EXAM

## 2022-07-14 ENCOUNTER — Telehealth: Payer: Self-pay

## 2022-07-14 NOTE — Telephone Encounter (Signed)
Pt is requesting a different DM medication as insurance will no longer cover Ozempic and he has been off of the medication for about a month.   Please advise

## 2022-07-14 NOTE — Telephone Encounter (Signed)
Does he know if his insurance will cover moujaro? Can he check?

## 2022-07-21 ENCOUNTER — Other Ambulatory Visit: Payer: Self-pay | Admitting: Nurse Practitioner

## 2022-07-21 ENCOUNTER — Telehealth: Payer: Self-pay

## 2022-07-21 DIAGNOSIS — E118 Type 2 diabetes mellitus with unspecified complications: Secondary | ICD-10-CM

## 2022-07-21 MED ORDER — TRULICITY 1.5 MG/0.5ML ~~LOC~~ SOAJ: 1.5000 mg | SUBCUTANEOUS | 1 refills | Status: DC

## 2022-07-21 NOTE — Telephone Encounter (Signed)
Pt states that with the denial letter insurance recommends Victoza or Trulicity.  Pt is ok with trying either of these if you approve them

## 2022-07-21 NOTE — Telephone Encounter (Signed)
I have changed this to trulicity 1.5 mg weekly and sent new prescription to [facility]

## 2022-07-21 NOTE — Telephone Encounter (Signed)
Trulicity requires a PA. Preferred formularies are; Royann Shivers, Lantus SoloStar, Metformin HCI

## 2022-07-22 ENCOUNTER — Ambulatory Visit (INDEPENDENT_AMBULATORY_CARE_PROVIDER_SITE_OTHER): Payer: No Typology Code available for payment source | Admitting: Nurse Practitioner

## 2022-07-22 VITALS — BP 126/68 | HR 60 | Temp 97.9°F | Wt 213.1 lb

## 2022-07-22 DIAGNOSIS — Z23 Encounter for immunization: Secondary | ICD-10-CM | POA: Diagnosis not present

## 2022-07-23 ENCOUNTER — Encounter: Payer: Self-pay | Admitting: Nurse Practitioner

## 2022-07-28 ENCOUNTER — Other Ambulatory Visit: Payer: Self-pay | Admitting: Nurse Practitioner

## 2022-07-28 DIAGNOSIS — E118 Type 2 diabetes mellitus with unspecified complications: Secondary | ICD-10-CM

## 2022-07-28 MED ORDER — JANUMET 50-500 MG PO TABS: 1.0000 | ORAL_TABLET | Freq: Two times a day (BID) | ORAL | 2 refills | Status: DC

## 2022-07-28 NOTE — Telephone Encounter (Signed)
Pt was advised that a PA is needed for Trulicity.

## 2022-07-28 NOTE — Telephone Encounter (Signed)
Preferred formularies are;  Farxiga  Janumet  Lantus Solostar Metformin ER  Soliqua 100/33  Toujeo Max SoloStar  Xultophy 100/3.6

## 2022-08-03 ENCOUNTER — Other Ambulatory Visit: Payer: Self-pay | Admitting: Nurse Practitioner

## 2022-08-03 DIAGNOSIS — E118 Type 2 diabetes mellitus with unspecified complications: Secondary | ICD-10-CM

## 2022-08-03 MED ORDER — TRULICITY 1.5 MG/0.5ML ~~LOC~~ SOAJ: 1.5000 mg | SUBCUTANEOUS | 3 refills | Status: DC

## 2022-08-04 ENCOUNTER — Emergency Department (HOSPITAL_BASED_OUTPATIENT_CLINIC_OR_DEPARTMENT_OTHER)
Admission: EM | Admit: 2022-08-04 | Discharge: 2022-08-04 | Disposition: A | Discharge status: Home / Self Care | Payer: No Typology Code available for payment source | Attending: Emergency Medicine | Admitting: Emergency Medicine

## 2022-08-04 ENCOUNTER — Emergency Department (HOSPITAL_BASED_OUTPATIENT_CLINIC_OR_DEPARTMENT_OTHER): Payer: No Typology Code available for payment source

## 2022-08-04 ENCOUNTER — Encounter (HOSPITAL_BASED_OUTPATIENT_CLINIC_OR_DEPARTMENT_OTHER): Payer: Self-pay

## 2022-08-04 ENCOUNTER — Other Ambulatory Visit: Payer: Self-pay

## 2022-08-04 ENCOUNTER — Emergency Department (HOSPITAL_BASED_OUTPATIENT_CLINIC_OR_DEPARTMENT_OTHER): Payer: No Typology Code available for payment source | Admitting: Radiology

## 2022-08-04 ENCOUNTER — Telehealth: Payer: Self-pay

## 2022-08-04 DIAGNOSIS — E1165 Type 2 diabetes mellitus with hyperglycemia: Secondary | ICD-10-CM | POA: Insufficient documentation

## 2022-08-04 DIAGNOSIS — Z79899 Other long term (current) drug therapy: Secondary | ICD-10-CM | POA: Insufficient documentation

## 2022-08-04 DIAGNOSIS — R011 Cardiac murmur, unspecified: Secondary | ICD-10-CM | POA: Diagnosis not present

## 2022-08-04 DIAGNOSIS — R739 Hyperglycemia, unspecified: Secondary | ICD-10-CM

## 2022-08-04 DIAGNOSIS — H539 Unspecified visual disturbance: Secondary | ICD-10-CM

## 2022-08-04 DIAGNOSIS — I10 Essential (primary) hypertension: Secondary | ICD-10-CM | POA: Diagnosis not present

## 2022-08-04 DIAGNOSIS — H538 Other visual disturbances: Secondary | ICD-10-CM | POA: Insufficient documentation

## 2022-08-04 DIAGNOSIS — R42 Dizziness and giddiness: Secondary | ICD-10-CM | POA: Diagnosis present

## 2022-08-04 LAB — APTT: aPTT: 25 seconds (ref 24–36)

## 2022-08-04 LAB — PROTIME-INR
INR: 1 (ref 0.8–1.2)
Prothrombin Time: 13.1 seconds (ref 11.4–15.2)

## 2022-08-04 LAB — COMPREHENSIVE METABOLIC PANEL
ALT: 29 U/L (ref 0–44)
AST: 26 U/L (ref 15–41)
Albumin: 4.4 g/dL (ref 3.5–5.0)
Alkaline Phosphatase: 65 U/L (ref 38–126)
Anion gap: 11 (ref 5–15)
BUN: 20 mg/dL (ref 8–23)
CO2: 24 mmol/L (ref 22–32)
Calcium: 10.2 mg/dL (ref 8.9–10.3)
Chloride: 95 mmol/L — ABNORMAL LOW (ref 98–111)
Creatinine, Ser: 1.09 mg/dL (ref 0.61–1.24)
GFR, Estimated: >60 mL/min (ref >60)
Glucose, Bld: 510 mg/dL (ref 70–99)
Potassium: 4.2 mmol/L (ref 3.5–5.1)
Sodium: 130 mmol/L — ABNORMAL LOW (ref 135–145)
Total Bilirubin: 0.6 mg/dL (ref 0.3–1.2)
Total Protein: 7.7 g/dL (ref 6.5–8.1)

## 2022-08-04 LAB — DIFFERENTIAL
Abs Immature Granulocytes: 0.04 10*3/uL (ref 0.00–0.07)
Basophils Absolute: 0.1 10*3/uL (ref 0.0–0.1)
Basophils Relative: 1 %
Eosinophils Absolute: 0.4 10*3/uL (ref 0.0–0.5)
Eosinophils Relative: 4 %
Immature Granulocytes: 0 %
Lymphocytes Relative: 33 %
Lymphs Abs: 3.3 10*3/uL (ref 0.7–4.0)
Monocytes Absolute: 0.9 10*3/uL (ref 0.1–1.0)
Monocytes Relative: 9 %
Neutro Abs: 5.5 10*3/uL (ref 1.7–7.7)
Neutrophils Relative %: 53 %

## 2022-08-04 LAB — CBC
HCT: 44.4 % (ref 39.0–52.0)
Hemoglobin: 15.8 g/dL (ref 13.0–17.0)
MCH: 29 pg (ref 26.0–34.0)
MCHC: 35.6 g/dL (ref 30.0–36.0)
MCV: 81.5 fL (ref 80.0–100.0)
Platelets: 306 10*3/uL (ref 150–400)
RBC: 5.45 MIL/uL (ref 4.22–5.81)
RDW: 11.6 % (ref 11.5–15.5)
WBC: 10.2 10*3/uL (ref 4.0–10.5)
nRBC: 0 % (ref 0.0–0.2)

## 2022-08-04 LAB — URINALYSIS, ROUTINE W REFLEX MICROSCOPIC
Bilirubin Urine: NEGATIVE
Glucose, UA: >1000 mg/dL — AB
Hgb urine dipstick: NEGATIVE
Ketones, ur: NEGATIVE mg/dL
Leukocytes,Ua: NEGATIVE
Nitrite: NEGATIVE
Protein, ur: NEGATIVE mg/dL
Specific Gravity, Urine: 1.029 (ref 1.005–1.030)
pH: 6.5 (ref 5.0–8.0)

## 2022-08-04 LAB — CBG MONITORING, ED
Glucose-Capillary: 495 mg/dL — ABNORMAL HIGH (ref 70–99)
Glucose-Capillary: 388 mg/dL — ABNORMAL HIGH (ref 70–99)

## 2022-08-04 LAB — TROPONIN I (HIGH SENSITIVITY): Troponin I (High Sensitivity): 7 ng/L (ref <18)

## 2022-08-04 LAB — ETHANOL: Alcohol, Ethyl (B): <10 mg/dL (ref <10)

## 2022-08-04 MED ORDER — SODIUM CHLORIDE 0.9 % IV SOLN: Freq: Once | INTRAVENOUS | Status: AC

## 2022-08-04 MED ORDER — INSULIN ASPART 100 UNIT/ML IJ SOLN
9.0000 [IU] | Freq: Once | INTRAMUSCULAR | Status: AC
  Administered 2022-08-04: 9 [IU] via SUBCUTANEOUS

## 2022-08-04 MED ORDER — ACETAMINOPHEN 500 MG PO TABS
1000.0000 mg | ORAL_TABLET | Freq: Once | ORAL | Status: AC
  Administered 2022-08-04: 1000 mg via ORAL
  Filled 2022-08-04: qty 2

## 2022-08-04 NOTE — ED Notes (Signed)
Patient transported to X-ray 

## 2022-08-04 NOTE — ED Notes (Signed)
ED Provider at bedside. 

## 2022-08-04 NOTE — ED Triage Notes (Addendum)
Patient here POV from Home.  Endorses Blurred Vision and Dizziness that began 3 Days ago. Constant and Consistent since it began. Also notes Hyperglycemia for the Past month since switching medications.   Also endorses CP that began 2 Months ago. Seen by MD for Same.   NAD Noted during Triage. A&Ox4. GCS 15. Ambulatory.

## 2022-08-04 NOTE — Telephone Encounter (Signed)
Pt called in complaining of chest discomfort and changes in vision.  Pt was recommended to go to the ER.  Pt agrees to go

## 2022-08-04 NOTE — Telephone Encounter (Signed)
Wonderful. Thank you.

## 2022-08-04 NOTE — Discharge Instructions (Addendum)
Your symptoms currently merit a evaluation by a neurologist.  Your CT scan did not show any acute stroke however did show the possibility of an old stroke. Hydrate, take your medications and continue to track your blood sugar.  Your symptoms may be related to your blood sugar however they could also be brain related.  Please also follow-up with your primary care doctor to have your blood sugar is readdressed.  A neurologist will help differentiate this process.  If you begin having slurring of your speech, facial droop, arm or leg weakness or any other new or concerning symptoms please go immediately to [facility] emergency room where you will be screened for a stroke. This facility here is not a stroke center and so I would recommend going to [facility] if the symptoms occur.

## 2022-08-04 NOTE — ED Provider Notes (Signed)
[facility] Provider Note   CSN: [id number] Arrival date & time: 08/04/22  1539     History  Chief Complaint  Patient presents with   Dizziness    Russell Robles is a 62 y.o. male.   Dizziness Patient is a 61 year old male with a past medical history significant for DM2, HTN, HLD, obesity, murmur  Patient presents emergency room today with complaints of dizziness for the past 3 days intermittently for episodes that last seconds to minutes he states he has maybe some transient blurred vision in both eyes when his episodes occur but does have some blurry vision at baseline.  He denies any history of strokes or heart disease other than having a murmur.  He states he has some right eye blurriness at baseline.  He was recently off his Ozempic and started back on Janumet 4 days ago.  His blood sugars been running high he has been urinating frequently but no burning with urination or urgency.  No blood in his urine.  Denies any other symptoms apart from chest pain which he states seems to be somewhat positional worse when he is rolling around in bed seems to be radiating to his right shoulder.  He is a non-smoker.  He states his chest pain is not exertional.  He was seen by his primary care doctor for this who thought it was musculoskeletal.     Home Medications Prior to Admission medications   Medication Sig Start Date End Date Taking? Authorizing Provider  amLODipine-valsartan (EXFORGE) 5-160 MG tablet TAKE 1 TABLET BY MOUTH DAILY. (TAKE WITH HCTZ 12.5 MG TABLET) 02/11/22   Abonza, Maritza, PA-C  Cholecalciferol (VITAMIN D3) 125 MCG (5000 UT) TABS 5,000 IU OTC vitamin D3 daily. 03/13/19   Opalski, Deborah, DO  CINNAMON PO Take 2 capsules by mouth daily.    [provider]  Cyanocobalamin (VITAMIN B12 PO) Take 1 tablet by mouth daily.    [provider]  Dulaglutide (TRULICITY) 1.5 0000000 SOPN Inject 1.5 mg into the  skin once a week. 08/03/22   Ronnell Freshwater, NP  hydrochlorothiazide (HYDRODIURIL) 12.5 MG tablet TAKE 1 TABLET BY MOUTH DAILY. (TAKE WITH AMLODIPINE-VALSARTAN) 02/11/22   Lorrene Reid, PA-C  meclizine (ANTIVERT) 12.5 MG tablet Take 1 tablet (12.5 mg total) by mouth 2 (two) times daily as needed for dizziness. 02/17/21   Ronnell Freshwater, NP  methylPREDNISolone (MEDROL) 4 MG TBPK tablet Take by mouth as directed for 6 days 04/16/21   Ronnell Freshwater, NP  metoprolol succinate (TOPROL-XL) 50 MG 24 hr tablet Take 1 tablet (50 mg total) by mouth daily. TAKE WITH OR IMMEDIATELY FOLLOWING A MEAL. 02/11/22   Lorrene Reid, PA-C  simvastatin (ZOCOR) 80 MG tablet Take 1 tablet (80 mg total) by mouth at bedtime. 02/11/22   Lorrene Reid, PA-C  sitaGLIPtin-metformin (JANUMET) 50-500 MG tablet Take 1 tablet by mouth 2 (two) times daily with a meal. 07/28/22   Boscia, Greer Ee, NP  triamcinolone ointment (KENALOG) 0.1 % Apply 1 application topically 2 (two) times daily. 04/16/21   Ronnell Freshwater, NP  valACYclovir (VALTREX) 1000 MG tablet Take 1 tablet (1,000 mg total) by mouth 2 (two) times daily. 04/16/21   Ronnell Freshwater, NP      Allergies    Patient has no known allergies.    Review of Systems   Review of Systems  Neurological:  Positive for dizziness.    Physical Exam Updated Vital Signs BP 132/83  Pulse 63   Temp 98 F (36.7 C) (Oral)   Resp 19   Ht 5' 8"$  (1.727 m)   Wt 96.7 kg   SpO2 94%   BMI 32.41 kg/m  Physical Exam Vitals and nursing note reviewed.  Constitutional:      General: He is not in acute distress. HENT:     Head: Normocephalic and atraumatic.     Nose: Nose normal.  Eyes:     General: No scleral icterus. Cardiovascular:     Rate and Rhythm: Normal rate and regular rhythm.     Pulses: Normal pulses.     Heart sounds: Murmur heard.  Pulmonary:     Effort: Pulmonary effort is normal. No respiratory distress.     Breath sounds: No wheezing.  Abdominal:      Palpations: Abdomen is soft.     Tenderness: There is no abdominal tenderness. There is no guarding or rebound.  Musculoskeletal:     Cervical back: Normal range of motion.     Right lower leg: No edema.     Left lower leg: No edema.  Skin:    General: Skin is warm and dry.     Capillary Refill: Capillary refill takes less than 2 seconds.  Neurological:     Mental Status: He is alert. Mental status is at baseline.     Comments: Alert and oriented to self, place, time and event.   Speech is fluent, clear without dysarthria or dysphasia.   Strength 5/5 in upper/lower extremities   Sensation intact in upper/lower extremities   Normal finger-to-nose and feet tapping.  CN I not tested  CN II grossly intact visual fields bilaterally. Did not visualize posterior eye.  CN III, IV, VI PERRLA and EOMs intact bilaterally  CN V Intact sensation to sharp and light touch to the face  CN VII facial movements symmetric  CN VIII not tested  CN IX, X no uvula deviation, symmetric rise of soft palate  CN XI 5/5 SCM and trapezius strength bilaterally  CN XII Midline tongue protrusion, symmetric L/R movements   Psychiatric:        Mood and Affect: Mood normal.        Behavior: Behavior normal.     ED Results / Procedures / Treatments   Labs (all labs ordered are listed, but only abnormal results are displayed) Labs Reviewed  COMPREHENSIVE METABOLIC PANEL - Abnormal; Notable for the following components:      Result Value   Sodium 130 (*)    Chloride 95 (*)    Glucose, Bld 510 (*)    All other components within normal limits  URINALYSIS, ROUTINE W REFLEX MICROSCOPIC - Abnormal; Notable for the following components:   Color, Urine COLORLESS (*)    Glucose, UA >1,000 (*)    Bacteria, UA RARE (*)    All other components within normal limits  CBG MONITORING, ED - Abnormal; Notable for the following components:   Glucose-Capillary 495 (*)    All other components within normal limits   CBG MONITORING, ED - Abnormal; Notable for the following components:   Glucose-Capillary 388 (*)    All other components within normal limits  PROTIME-INR  APTT  CBC  DIFFERENTIAL  ETHANOL  TROPONIN I (HIGH SENSITIVITY)    EKG EKG Interpretation  Date/Time:  Thursday August 04 2022 15:50:25 EST Ventricular Rate:  59 PR Interval:  168 QRS Duration: 104 QT Interval:  410 QTC Calculation: 405 R Axis:   -35 Text  Interpretation: Sinus bradycardia Left axis deviation Minimal voltage criteria for LVH, may be normal variant ( Cornell product ) Abnormal ECG When compared with ECG of 01-Aug-2018 13:40, QRS axis Shifted left Non-specific change in ST segment in Inferior leads Nonspecific T wave abnormality no longer evident in Inferior leads Confirmed by Margaretmary Eddy [phone]) on 08/04/2022 4:48:54 PM  Radiology DG Chest 2 View  Result Date: 08/04/2022 CLINICAL DATA:  Chest pain and blurred vision with dizziness that began 3 days ago. EXAM: CHEST - 2 VIEW COMPARISON:  None available. FINDINGS: EKG leads project over the chest. Trachea midline. Cardiomediastinal contours and hilar structures are normal. Lungs are clear. No pneumothorax. No pleural effusion. On limited assessment no acute skeletal process IMPRESSION: No active cardiopulmonary disease. Electronically Signed   By: Zetta Bills M.D.   On: 08/04/2022 17:25   CT HEAD WO CONTRAST  Result Date: 08/04/2022 CLINICAL DATA:  Dizziness and blurred vision x3 days. EXAM: CT HEAD WITHOUT CONTRAST TECHNIQUE: Contiguous axial images were obtained from the base of the skull through the vertex without intravenous contrast. RADIATION DOSE REDUCTION: This exam was performed according to the departmental dose-optimization program which includes automated exposure control, adjustment of the mA and/or kV according to patient size and/or use of iterative reconstruction technique. COMPARISON:  None Available. FINDINGS: Brain: There is mild cerebral  atrophy with widening of the extra-axial spaces and ventricular dilatation. There are areas of decreased attenuation within the white matter tracts of the supratentorial brain, consistent with microvascular disease changes. A chronic left basal ganglia lacunar infarct is noted. Vascular: No hyperdense vessel or unexpected calcification. Skull: Normal. Negative for fracture or focal lesion. Sinuses/Orbits: There is mild left ethmoid sinus mucosal thickening. Other: None. IMPRESSION: 1. No acute intracranial abnormality. 2. Chronic left basal ganglia lacunar infarct. 3. Generalized cerebral atrophy with chronic microvascular disease changes of the supratentorial brain. 4. Mild left ethmoid sinus disease. Electronically Signed   By: Virgina Norfolk M.D.   On: 08/04/2022 16:12    Procedures Procedures    Medications Ordered in ED Medications  acetaminophen (TYLENOL) tablet 1,000 mg (1,000 mg Oral Given 08/04/22 1722)  0.9 %  sodium chloride infusion (0 mLs Intravenous Stopped 08/04/22 1920)  insulin aspart (novoLOG) injection 9 Units (9 Units Subcutaneous Given 08/04/22 1720)    ED Course/ Medical Decision Making/ A&P Clinical Course as of 08/04/22 2041  Thu Aug 04, 2022  1637 Vertigo that began while at work. States that episodes that last seconds to minutes. Blurry vision in both eyes when these episodes occur.   No stroke, heart disease.   R eye blurred - normal  Was off ozempic back on janumet 4 days.  [WF]    Clinical Course User Index [WF] Tedd Sias, PA                             Medical Decision Making Amount and/or Complexity of Data Reviewed Labs: ordered. Radiology: ordered.  Risk OTC drugs. Prescription drug management.    This patient presents to the ED for concern of dizziness, this involves a number of treatment options, and is a complaint that carries with it a moderate to high risk of complications and morbidity. A differential diagnosis was considered for  the patient's symptoms which is discussed below:    By episode duration  Auditory symptoms present Seconds: perilymphatic fistula Hours: endolymphatic hydrops (Mnire's disease, syphilis) Days: labyrinthitis, labyrinthine concussion (head trauma) Months: acoustic neuroma,  ototoxicity  Auditory symptoms absent  Seconds: positioning vertigo (cupulolithiasis), vertebrobasilar insufficiency, cervical vertigo (head-extension vertigo), diplopia Hours: recurrent vestibulopathy (Mnire's disease without auditory symptoms), vestibular migraine Days: vestibular neuronitis, head trauma Months: vertebrobasilar insufficiency, arteriovenous malformation, brainstem or cerebellar tumor, cerebellar degeneration, multiple sclerosis, vertebrobasilar migraine Drugs: Anticonvulsants (eg, phenytoin), antibiotics (eg, aminoglycosides, doxycycline, metronidazole), hypnotics (eg, diazepam), analgesics (eg, aspirin), alcohol   Considered posterior cerebellar stroke versus more peripheral cause of symptoms.  Overall symptoms are somewhat odd in the presentation.  Intermittent.   Co morbidities: Discussed in HPI   Brief History:  Patient is a 62 year old male with a past medical history significant for DM2, HTN, HLD, obesity, murmur  Patient presents emergency room today with complaints of dizziness for the past 3 days intermittently for episodes that last seconds to minutes he states he has maybe some transient blurred vision in both eyes when his episodes occur but does have some blurry vision at baseline.  He denies any history of strokes or heart disease other than having a murmur.  He states he has some right eye blurriness at baseline.  He was recently off his Ozempic and started back on Janumet 4 days ago.  His blood sugars been running high he has been urinating frequently but no burning with urination or urgency.  No blood in his urine.  Denies any other symptoms apart from chest pain which he  states seems to be somewhat positional worse when he is rolling around in bed seems to be radiating to his right shoulder.  He is a non-smoker.  He states his chest pain is not exertional.  He was seen by his primary care doctor for this who thought it was musculoskeletal.    EMR reviewed including pt PMHx, past surgical history and past visits to ER.   See HPI for more details   Lab Tests:   I ordered and independently interpreted labs. Labs notable for Significant hyperglycemia hyponatremia which corrects to normal.  Glucose urea, troponin x 1 within normal limits do not suspect ACS and his symptoms seem to only occur when he is rolling around in bed at certain times..  No chest pain currently.  CBG 388 improved after hydration and insulin.  Imaging Studies:  Abnormal findings. I personally reviewed all imaging studies. Imaging notable for Evidence of old strokes.  No acute stroke however.  No hemorrhage.  Chest x-ray without infiltrate or abnormal finding   Cardiac Monitoring:  The patient was maintained on a cardiac monitor.  I personally viewed and interpreted the cardiac monitored which showed an underlying rhythm of: SR EKG non-ischemic   Medicines ordered:  I ordered medication including 1 L normal saline, Tylenol, insulin for hyperglycemia Reevaluation of the patient after these medicines showed that the patient improved I have reviewed the patients home medicines and have made adjustments as needed   Critical Interventions:     Consults/Attending Physician   I discussed this case with my attending physician who cosigned this note including patient's presenting symptoms, physical exam, and planned diagnostics and interventions. Attending physician stated agreement with plan or made changes to plan which were implemented.   Attending physician assessed patient at bedside.    Reevaluation:  After the interventions noted above I re-evaluated patient and  found that they have :stayed the same currently no symptoms   Social Determinants of Health:      Problem List / ED Course:  Intermittent episodes of nearly vertiginous symptoms 12 total of the symptoms over the  past 3 to 4 days.  He has not had any constant symptoms.  No history of stroke or heart attacks.  Does have CT evidence of some old strokes.  Evaluated by my attending physician who agrees my plan to discharge home with neurology follow-up.  He is extremely hyperglycemic here which was improved with hydration and insulin.  He has been restarted on his oral antihyperglycemic's a few days ago and I presume that his hyperglycemia will improve.  He will closely follow-up with PCP but also will see neurology given these abnormal symptoms.  Strict return precautions discussed patient agreeable to plan.  Vital signs normal, ambulating without difficulty normal neurologic exam at time of discharge.   Dispostion:  After consideration of the diagnostic results and the patients response to treatment, I feel that the patent would benefit from close outpatient follow-up.   Final Clinical Impression(s) / ED Diagnoses Final diagnoses:  Hyperglycemia  Vision changes    Rx / DC Orders ED Discharge Orders     None         Tedd Sias, [place] 08/04/22 2358    Fransico Meadow, MD 08/08/22 [phone]

## 2022-08-08 ENCOUNTER — Telehealth: Payer: Self-pay | Admitting: *Deleted

## 2022-08-08 NOTE — Telephone Encounter (Signed)
I am not sure about the PA for trulicity, but if it will help her to feel better, they can come for that acute spot on Wednesday

## 2022-08-08 NOTE — Telephone Encounter (Signed)
Contacted pt wife and she is going to bring pt to visit to go over medication and to see what is going on with him. Russell Robles, CMA

## 2022-08-08 NOTE — Telephone Encounter (Signed)
Pt wife calling very frustrated and concerned about husband. She said his insurance changed and that he hasn't had his diabetic medication in 2 months.  She said that he started the janumet last Monday and they are waiting on a PA for the trulicity, she wanted to know the satus on that.   She said his sugars are staying in the 300's which they were in 500 and then 400.  She said he has dizziness, and feels like everything is going to the right and he is having a lot of confusion.  He can't get into Neurology until march, she mentioned that at some point the ED said he has had a small stroke, she would like to know what she needs to do, should he come in before his appointment on 08/17/22, should some other medication be sent in.  She said he was in the bed an it is 11:00. You do have an ACUTE spot on Wednesday if you would like to see him in that. Please advise

## 2022-08-10 ENCOUNTER — Telehealth: Payer: Self-pay | Admitting: *Deleted

## 2022-08-10 ENCOUNTER — Inpatient Hospital Stay (HOSPITAL_COMMUNITY)
Admission: EM | Admit: 2022-08-10 | Discharge: 2022-08-17 | DRG: 101 | Disposition: A | Discharge status: Home / Self Care | Payer: No Typology Code available for payment source | Attending: Internal Medicine | Admitting: Internal Medicine

## 2022-08-10 ENCOUNTER — Emergency Department (HOSPITAL_COMMUNITY): Payer: No Typology Code available for payment source

## 2022-08-10 ENCOUNTER — Other Ambulatory Visit: Payer: Self-pay

## 2022-08-10 ENCOUNTER — Other Ambulatory Visit: Payer: Self-pay | Admitting: Nurse Practitioner

## 2022-08-10 ENCOUNTER — Encounter: Payer: Self-pay | Admitting: Nurse Practitioner

## 2022-08-10 ENCOUNTER — Ambulatory Visit: Payer: No Typology Code available for payment source | Admitting: Nurse Practitioner

## 2022-08-10 VITALS — BP 111/70 | HR 55 | Ht 68.0 in | Wt 200.8 lb

## 2022-08-10 DIAGNOSIS — E785 Hyperlipidemia, unspecified: Secondary | ICD-10-CM | POA: Diagnosis present

## 2022-08-10 DIAGNOSIS — R0789 Other chest pain: Secondary | ICD-10-CM | POA: Diagnosis present

## 2022-08-10 DIAGNOSIS — Z7985 Long-term (current) use of injectable non-insulin antidiabetic drugs: Secondary | ICD-10-CM

## 2022-08-10 DIAGNOSIS — H5500 Unspecified nystagmus: Secondary | ICD-10-CM | POA: Diagnosis not present

## 2022-08-10 DIAGNOSIS — R410 Disorientation, unspecified: Principal | ICD-10-CM

## 2022-08-10 DIAGNOSIS — R569 Unspecified convulsions: Secondary | ICD-10-CM

## 2022-08-10 DIAGNOSIS — Z91141 Patient's other noncompliance with medication regimen due to financial hardship: Secondary | ICD-10-CM

## 2022-08-10 DIAGNOSIS — E876 Hypokalemia: Secondary | ICD-10-CM | POA: Diagnosis present

## 2022-08-10 DIAGNOSIS — E1165 Type 2 diabetes mellitus with hyperglycemia: Secondary | ICD-10-CM | POA: Diagnosis present

## 2022-08-10 DIAGNOSIS — N390 Urinary tract infection, site not specified: Secondary | ICD-10-CM | POA: Diagnosis present

## 2022-08-10 DIAGNOSIS — Z801 Family history of malignant neoplasm of trachea, bronchus and lung: Secondary | ICD-10-CM

## 2022-08-10 DIAGNOSIS — Z79899 Other long term (current) drug therapy: Secondary | ICD-10-CM

## 2022-08-10 DIAGNOSIS — Z597 Insufficient social insurance and welfare support: Secondary | ICD-10-CM

## 2022-08-10 DIAGNOSIS — I6381 Other cerebral infarction due to occlusion or stenosis of small artery: Secondary | ICD-10-CM

## 2022-08-10 DIAGNOSIS — I69398 Other sequelae of cerebral infarction: Secondary | ICD-10-CM

## 2022-08-10 DIAGNOSIS — I679 Cerebrovascular disease, unspecified: Secondary | ICD-10-CM | POA: Diagnosis not present

## 2022-08-10 DIAGNOSIS — T383X6A Underdosing of insulin and oral hypoglycemic [antidiabetic] drugs, initial encounter: Secondary | ICD-10-CM | POA: Diagnosis present

## 2022-08-10 DIAGNOSIS — E1169 Type 2 diabetes mellitus with other specified complication: Secondary | ICD-10-CM | POA: Diagnosis present

## 2022-08-10 DIAGNOSIS — Z7984 Long term (current) use of oral hypoglycemic drugs: Secondary | ICD-10-CM

## 2022-08-10 DIAGNOSIS — E669 Obesity, unspecified: Secondary | ICD-10-CM | POA: Diagnosis present

## 2022-08-10 DIAGNOSIS — E118 Type 2 diabetes mellitus with unspecified complications: Secondary | ICD-10-CM

## 2022-08-10 DIAGNOSIS — R079 Chest pain, unspecified: Secondary | ICD-10-CM

## 2022-08-10 DIAGNOSIS — M1990 Unspecified osteoarthritis, unspecified site: Secondary | ICD-10-CM | POA: Diagnosis present

## 2022-08-10 DIAGNOSIS — E1159 Type 2 diabetes mellitus with other circulatory complications: Secondary | ICD-10-CM | POA: Diagnosis present

## 2022-08-10 DIAGNOSIS — R4701 Aphasia: Secondary | ICD-10-CM | POA: Diagnosis present

## 2022-08-10 DIAGNOSIS — G40909 Epilepsy, unspecified, not intractable, without status epilepticus: Principal | ICD-10-CM | POA: Diagnosis present

## 2022-08-10 DIAGNOSIS — F419 Anxiety disorder, unspecified: Secondary | ICD-10-CM | POA: Diagnosis present

## 2022-08-10 DIAGNOSIS — I10 Essential (primary) hypertension: Secondary | ICD-10-CM | POA: Diagnosis present

## 2022-08-10 DIAGNOSIS — Z6832 Body mass index (BMI) 32.0-32.9, adult: Secondary | ICD-10-CM

## 2022-08-10 DIAGNOSIS — I152 Hypertension secondary to endocrine disorders: Secondary | ICD-10-CM | POA: Diagnosis present

## 2022-08-10 DIAGNOSIS — R42 Dizziness and giddiness: Secondary | ICD-10-CM

## 2022-08-10 DIAGNOSIS — D72829 Elevated white blood cell count, unspecified: Secondary | ICD-10-CM | POA: Diagnosis present

## 2022-08-10 DIAGNOSIS — E119 Type 2 diabetes mellitus without complications: Secondary | ICD-10-CM

## 2022-08-10 DIAGNOSIS — Z825 Family history of asthma and other chronic lower respiratory diseases: Secondary | ICD-10-CM

## 2022-08-10 DIAGNOSIS — K5900 Constipation, unspecified: Secondary | ICD-10-CM | POA: Diagnosis not present

## 2022-08-10 DIAGNOSIS — R001 Bradycardia, unspecified: Secondary | ICD-10-CM | POA: Diagnosis present

## 2022-08-10 DIAGNOSIS — H539 Unspecified visual disturbance: Secondary | ICD-10-CM | POA: Insufficient documentation

## 2022-08-10 LAB — CBC WITH DIFFERENTIAL/PLATELET
Abs Immature Granulocytes: 0.03 10*3/uL (ref 0.00–0.07)
Basophils Absolute: 0.1 10*3/uL (ref 0.0–0.1)
Basophils Relative: 1 %
Eosinophils Absolute: 0.4 10*3/uL (ref 0.0–0.5)
Eosinophils Relative: 3 %
HCT: 46.1 % (ref 39.0–52.0)
Hemoglobin: 16.1 g/dL (ref 13.0–17.0)
Immature Granulocytes: 0 %
Lymphocytes Relative: 39 %
Lymphs Abs: 4.3 10*3/uL — ABNORMAL HIGH (ref 0.7–4.0)
MCH: 29.4 pg (ref 26.0–34.0)
MCHC: 34.9 g/dL (ref 30.0–36.0)
MCV: 84.3 fL (ref 80.0–100.0)
Monocytes Absolute: 0.9 10*3/uL (ref 0.1–1.0)
Monocytes Relative: 8 %
Neutro Abs: 5.3 10*3/uL (ref 1.7–7.7)
Neutrophils Relative %: 49 %
Platelets: 283 10*3/uL (ref 150–400)
RBC: 5.47 MIL/uL (ref 4.22–5.81)
RDW: 11.5 % (ref 11.5–15.5)
WBC: 11 10*3/uL — ABNORMAL HIGH (ref 4.0–10.5)
nRBC: 0 % (ref 0.0–0.2)

## 2022-08-10 LAB — URINALYSIS, ROUTINE W REFLEX MICROSCOPIC
Bilirubin Urine: NEGATIVE
Glucose, UA: >=500 mg/dL — AB
Hgb urine dipstick: NEGATIVE
Ketones, ur: 5 mg/dL — AB
Leukocytes,Ua: NEGATIVE
Nitrite: POSITIVE — AB
Protein, ur: NEGATIVE mg/dL
Specific Gravity, Urine: 1.028 (ref 1.005–1.030)
pH: 5 (ref 5.0–8.0)

## 2022-08-10 LAB — I-STAT CHEM 8, ED
BUN: 21 mg/dL (ref 8–23)
Calcium, Ion: 1.16 mmol/L (ref 1.15–1.40)
Chloride: 97 mmol/L — ABNORMAL LOW (ref 98–111)
Creatinine, Ser: 1 mg/dL (ref 0.61–1.24)
Glucose, Bld: 390 mg/dL — ABNORMAL HIGH (ref 70–99)
HCT: 49 % (ref 39.0–52.0)
Hemoglobin: 16.7 g/dL (ref 13.0–17.0)
Potassium: 3.6 mmol/L (ref 3.5–5.1)
Sodium: 134 mmol/L — ABNORMAL LOW (ref 135–145)
TCO2: 26 mmol/L (ref 22–32)

## 2022-08-10 LAB — RAPID URINE DRUG SCREEN, HOSP PERFORMED
Amphetamines: NOT DETECTED
Barbiturates: NOT DETECTED
Benzodiazepines: NOT DETECTED
Cocaine: NOT DETECTED
Opiates: NOT DETECTED
Tetrahydrocannabinol: NOT DETECTED

## 2022-08-10 LAB — CBG MONITORING, ED
Glucose-Capillary: 332 mg/dL — ABNORMAL HIGH (ref 70–99)
Glucose-Capillary: 459 mg/dL — ABNORMAL HIGH (ref 70–99)

## 2022-08-10 LAB — COMPREHENSIVE METABOLIC PANEL
ALT: 32 U/L (ref 0–44)
AST: 26 U/L (ref 15–41)
Albumin: 4.2 g/dL (ref 3.5–5.0)
Alkaline Phosphatase: 73 U/L (ref 38–126)
Anion gap: 11 (ref 5–15)
BUN: 18 mg/dL (ref 8–23)
CO2: 25 mmol/L (ref 22–32)
Calcium: 10 mg/dL (ref 8.9–10.3)
Chloride: 98 mmol/L (ref 98–111)
Creatinine, Ser: 1.17 mg/dL (ref 0.61–1.24)
GFR, Estimated: >60 mL/min (ref >60)
Glucose, Bld: 407 mg/dL — ABNORMAL HIGH (ref 70–99)
Potassium: 3.8 mmol/L (ref 3.5–5.1)
Sodium: 134 mmol/L — ABNORMAL LOW (ref 135–145)
Total Bilirubin: 0.8 mg/dL (ref 0.3–1.2)
Total Protein: 7.6 g/dL (ref 6.5–8.1)

## 2022-08-10 LAB — HEMOGLOBIN A1C
Hgb A1c MFr Bld: 13.1 % — ABNORMAL HIGH (ref 4.8–5.6)
Mean Plasma Glucose: 329.27 mg/dL

## 2022-08-10 LAB — ETHANOL: Alcohol, Ethyl (B): <10 mg/dL (ref <10)

## 2022-08-10 MED ORDER — LORAZEPAM 2 MG/ML IJ SOLN
1.0000 mg | INTRAMUSCULAR | Status: DC | PRN
  Administered 2022-08-11: 1 mg via INTRAVENOUS
  Filled 2022-08-10: qty 1

## 2022-08-10 MED ORDER — TIRZEPATIDE 5 MG/0.5ML ~~LOC~~ SOAJ: 5.0000 mg | SUBCUTANEOUS | 1 refills | Status: DC

## 2022-08-10 MED ORDER — INSULIN ASPART 100 UNIT/ML IJ SOLN
5.0000 [IU] | Freq: Once | INTRAMUSCULAR | Status: AC
  Administered 2022-08-11: 5 [IU] via SUBCUTANEOUS

## 2022-08-10 MED ORDER — SODIUM CHLORIDE 0.9 % IV BOLUS
1000.0000 mL | Freq: Once | INTRAVENOUS | Status: AC
  Administered 2022-08-10: 1000 mL via INTRAVENOUS

## 2022-08-10 NOTE — ED Provider Notes (Signed)
[facility] Provider Note   CSN: [id number] Arrival date & time: 08/10/22  1558     History {Add pertinent medical, surgical, social history, OB history to HPI:1} Chief Complaint  Patient presents with   Eye Problem    Russell Robles is a 62 y.o. male.  The history is provided by the patient, the spouse and medical records. No language interpreter was used.  Eye Problem   Patient is a 62 yr old male presenting to the ED with vision problems. Patient has experienced intermittent episodes of confusion with his vision shifting. According to the patient, this occurs about 10-12 times a day. Patient has not noticed it precipitating factors. Patient was here last Thursday, February 15th for Head CT scan with no acute findings and was advised to see neurologist. Patient was with his PCP today where he had another episode of vision shift and confusion. He was advised to come to the ED for an MRI. Patient has a history of uncontrolled diabetes. Patient states his diabetic eye exam in December 2023 came back normal. He denies having SOB, nausea or vomiting. Patient does not have a history of seizures.   Home Medications Prior to Admission medications   Medication Sig Start Date End Date Taking? Authorizing Provider  amLODipine-valsartan (EXFORGE) 5-160 MG tablet TAKE 1 TABLET BY MOUTH DAILY. (TAKE WITH HCTZ 12.5 MG TABLET) 02/11/22   Abonza, Maritza, PA-C  Cholecalciferol (VITAMIN D3) 125 MCG (5000 UT) TABS 5,000 IU OTC vitamin D3 daily. 03/13/19   Opalski, Neoma Laming, DO  Cyanocobalamin (VITAMIN B12 PO) Take 1 tablet by mouth daily.    [provider]  hydrochlorothiazide (HYDRODIURIL) 12.5 MG tablet TAKE 1 TABLET BY MOUTH DAILY. (TAKE WITH AMLODIPINE-VALSARTAN) 02/11/22   Lorrene Reid, PA-C  meclizine (ANTIVERT) 12.5 MG tablet Take 1 tablet (12.5 mg total) by mouth 2 (two) times daily as needed for dizziness. 02/17/21   Ronnell Freshwater,  NP  methylPREDNISolone (MEDROL) 4 MG TBPK tablet Take by mouth as directed for 6 days 04/16/21   Ronnell Freshwater, NP  metoprolol succinate (TOPROL-XL) 50 MG 24 hr tablet Take 1 tablet (50 mg total) by mouth daily. TAKE WITH OR IMMEDIATELY FOLLOWING A MEAL. 02/11/22   Lorrene Reid, PA-C  simvastatin (ZOCOR) 80 MG tablet Take 1 tablet (80 mg total) by mouth at bedtime. 02/11/22   Lorrene Reid, PA-C  sitaGLIPtin-metformin (JANUMET) 50-500 MG tablet Take 1 tablet by mouth 2 (two) times daily with a meal. 07/28/22   Boscia, Greer Ee, NP  tirzepatide [facility]) 5 MG/0.5ML Pen Inject 5 mg into the skin once a week. 08/10/22   Ronnell Freshwater, NP  triamcinolone ointment (KENALOG) 0.1 % Apply 1 application topically 2 (two) times daily. 04/16/21   Ronnell Freshwater, NP  valACYclovir (VALTREX) 1000 MG tablet Take 1 tablet (1,000 mg total) by mouth 2 (two) times daily. 04/16/21   Ronnell Freshwater, NP      Allergies    Patient has no known allergies.    Review of Systems   Review of Systems  All other systems reviewed and are negative.   Physical Exam Updated Vital Signs BP (!) 154/86   Pulse (!) 57   Temp 98.3 F (36.8 C)   Resp 16   SpO2 96%  Physical Exam Vitals and nursing note reviewed.  Constitutional:      General: He is not in acute distress.    Appearance: He is well-developed.  HENT:  Head: Atraumatic.  Eyes:     Conjunctiva/sclera: Conjunctivae normal.  Cardiovascular:     Rate and Rhythm: Normal rate and regular rhythm.     Pulses: Normal pulses.     Heart sounds: Normal heart sounds.  Pulmonary:     Effort: Pulmonary effort is normal.     Breath sounds: Normal breath sounds. No wheezing, rhonchi or rales.  Abdominal:     Palpations: Abdomen is soft.     Tenderness: There is no abdominal tenderness.  Musculoskeletal:        General: Normal range of motion.     Cervical back: Neck supple.  Skin:    Findings: No rash.  Neurological:     Mental Status: He is  alert and oriented to person, place, and time.     GCS: GCS eye subscore is 4. GCS verbal subscore is 5. GCS motor subscore is 6.     Cranial Nerves: Cranial nerves 2-12 are intact.     Sensory: Sensation is intact.     Motor: Motor function is intact.     Coordination: Coordination is intact. Romberg sign negative.     Gait: Gait is intact.     ED Results / Procedures / Treatments   Labs (all labs ordered are listed, but only abnormal results are displayed) Labs Reviewed  CBG MONITORING, ED - Abnormal; Notable for the following components:      Result Value   Glucose-Capillary 459 (*)    All other components within normal limits    EKG None  Radiology No results found.  Procedures Procedures  {Document cardiac monitor, telemetry assessment procedure when appropriate:1}  Medications Ordered in ED Medications - No data to display  ED Course/ Medical Decision Making/ A&P   {   Click here for ABCD2, HEART and other calculatorsREFRESH Note before signing :1}                          Medical Decision Making Amount and/or Complexity of Data Reviewed Labs: ordered. Radiology: ordered. ECG/medicine tests: ordered.   BP (!) 154/86   Pulse (!) 57   Temp 98.3 F (36.8 C)   Resp 16   SpO2 96%   75:32 PM 62 year old male significant history of non-insulin-dependent diabetes, hypertension, obesity, anxiety sent here per recommendation of neurology for evaluation of vision changes.  Patient report for over a week he has had episodes in which his vision was shifted rightward and will go back and forth lasting for seconds to minutes.  He also have episodes of confusion accompanied with this.  Occasionally has a mild headache only.  Does not endorse any fever, double vision, loss of vision, neck pain, chest pain, trouble breathing, abdominal pain, back pain, focal numbness or focal weakness.  He denies alcohol or tobacco use.  He is sleeping more than usual.  He was seen in the ED a  week ago when his symptoms started.  Had a head CT scan at that time that showed some chronic stroke and was recommended to follow-up with neurology.  Unfortunately his upcoming neurology appointment is not until March.  His symptoms still persist which concerns him.  He also reported that his blood sugar has been fairly uncontrolled.  It is fluctuated between 400-500 for several months as he is medication for better control.  Was seen by PCP today for his symptoms and sent here.  He also endorsed polyuria and polydipsia  On exam, patient is  resting comfortably in bed appears to be in no acute discomfort.  He is answering questions appropriately and follow instruction appropriately however he had intermittent episodes for which patient seems to be pausing, and unable to fully follow commands and having difficulty getting his words out.  These episodes lasting roughly a minute or 2 and would spontaneously resolved.  He does not exhibit any nystagmus specifically no rotary nystagmus.  His vision is intact.  He does not have any other focal neurodeficit.  No arm drift.  Normal gait.  Heart with normal rate and rhythm, lungs are clear to auscultation bilaterally abdomen is soft nontender.  Strength is equal throughout.  -Labs ordered, independently viewed and interpreted by me.  Labs remarkable for *** -The patient was maintained on a cardiac monitor.  I personally viewed and interpreted the cardiac monitored which showed an underlying rhythm of: *** -Imaging independently viewed and interpreted by me and I agree with radiologist's interpretation.  Result remarkable for *** -This patient presents to the ED for concern of ***, this involves an extensive number of treatment options, and is a complaint that carries with it a high risk of complications and morbidity.  The differential diagnosis includes *** -Co morbidities that complicate the patient evaluation includes *** -Treatment includes *** -Reevaluation of  the patient after these medicines showed that the patient {resolved/improved/worsened:23923::"improved"} -PCP office notes or outside notes reviewed -Discussion with specialist *** -Escalation to admission/observation considered: patients feels much better, is comfortable with discharge, and will follow up with PCP -Prescription medication considered, patient comfortable with *** -Social Determinant of Health considered which includes ***   {Document critical care time when appropriate:1} {Document review of labs and clinical decision tools ie heart score, Chads2Vasc2 etc:1}  {Document your independent review of radiology images, and any outside records:1} {Document your discussion with family members, caretakers, and with consultants:1} {Document social determinants of health affecting pt's care:1} {Document your decision making why or why not admission, treatments were needed:1} Final Clinical Impression(s) / ED Diagnoses Final diagnoses:  None    Rx / DC Orders ED Discharge Orders     None

## 2022-08-10 NOTE — ED Triage Notes (Signed)
Patient here for further evaluation of "vision continuously moving to the right" since one week ago. Patient seen at [facility] for same. Family also reports patient is sleeping more than normal this week.

## 2022-08-10 NOTE — Progress Notes (Signed)
Established patient visit   Patient: Russell Robles   DOB: 08-26-1960   62 y.o. Male  MRN: [id number] Visit Date: 08/10/2022   Chief Complaint  Patient presents with   Hospitalization Follow-up   Subjective    HPI  Follow up  -visual disturbance.  -vision is blurry and everything is moving to the right . -patient is confused.  -patient is dizzy   He did go to ER on 08/04/2022 -CT of the head showed -  --IMPRESSION: 1. No acute intracranial abnormality. 2. Chronic left basal ganglia lacunar infarct. 3. Generalized cerebral atrophy with chronic microvascular disease changes of the supratentorial brain. 4. Mild left ethmoid sinus disease.  Patient has been out of work since Thursday 08/04/2022 due to symptoms  -patient's wife left work to care for husband. Does not feel comfortable with him driving or staying alone  -moderate confusion  -forgets simple instructions  -forgetting words  -forgets what he is saying in middle of sentence. Thought process changes completely   Blood sugars elevated recently.  Restarted Janumet 08/01/2022 -insurance denied ozempic 1 mg weekly. Recommended prescription for trulicity or victoza  -new prescription for trulicity sent to pharmacy upon denial. Recommendation made for [facility]  -new prescription for [facility] sent to pharmacy and was rejected, needing prior authorization same day.     Medications: Outpatient Medications Prior to Visit  Medication Sig   amLODipine-valsartan (EXFORGE) 5-160 MG tablet TAKE 1 TABLET BY MOUTH DAILY. (TAKE WITH HCTZ 12.5 MG TABLET)   Cholecalciferol (VITAMIN D3) 125 MCG (5000 UT) TABS 5,000 IU OTC vitamin D3 daily.   Cyanocobalamin (VITAMIN B12 PO) Take 1 tablet by mouth daily.   hydrochlorothiazide (HYDRODIURIL) 12.5 MG tablet TAKE 1 TABLET BY MOUTH DAILY. (TAKE WITH AMLODIPINE-VALSARTAN)   meclizine (ANTIVERT) 12.5 MG tablet Take 1 tablet (12.5 mg total) by mouth 2 (two) times daily as needed for  dizziness.   methylPREDNISolone (MEDROL) 4 MG TBPK tablet Take by mouth as directed for 6 days   metoprolol succinate (TOPROL-XL) 50 MG 24 hr tablet Take 1 tablet (50 mg total) by mouth daily. TAKE WITH OR IMMEDIATELY FOLLOWING A MEAL.   simvastatin (ZOCOR) 80 MG tablet Take 1 tablet (80 mg total) by mouth at bedtime.   sitaGLIPtin-metformin (JANUMET) 50-500 MG tablet Take 1 tablet by mouth 2 (two) times daily with a meal.   tirzepatide (MOUNJARO) 5 MG/0.5ML Pen Inject 5 mg into the skin once a week.   triamcinolone ointment (KENALOG) 0.1 % Apply 1 application topically 2 (two) times daily.   valACYclovir (VALTREX) 1000 MG tablet Take 1 tablet (1,000 mg total) by mouth 2 (two) times daily.   [DISCONTINUED] CINNAMON PO Take 2 capsules by mouth daily.   No facility-administered medications prior to visit.    Review of Systems  Constitutional:  Positive for fatigue. Negative for activity change, chills and fever.  HENT:  Negative for congestion, postnasal drip, rhinorrhea, sinus pressure, sinus pain, sneezing and sore throat.   Eyes:  Positive for photophobia, pain and visual disturbance.  Respiratory:  Negative for cough, shortness of breath and wheezing.   Cardiovascular:  Negative for chest pain and palpitations.  Gastrointestinal:  Negative for constipation, diarrhea, nausea and vomiting.  Endocrine: Negative for cold intolerance, heat intolerance, polydipsia and polyuria.       Elevated blood sugar   Genitourinary:  Negative for dysuria, frequency and urgency.  Musculoskeletal:  Negative for back pain and myalgias.  Skin:  Negative for rash.  Allergic/Immunologic: Negative for environmental allergies.  Neurological:  Positive for dizziness, speech difficulty, weakness and light-headedness. Negative for headaches.  Psychiatric/Behavioral:  The patient is nervous/anxious.     Last CBC Lab Results  Component Value Date   WBC 10.2 08/04/2022   HGB 15.8 08/04/2022   HCT 44.4  08/04/2022   MCV 81.5 08/04/2022   MCH 29.0 08/04/2022   RDW 11.6 08/04/2022   PLT 306 [id number]   Last metabolic panel Lab Results  Component Value Date   GLUCOSE 510 (HH) 08/04/2022   NA 130 (L) 08/04/2022   K 4.2 08/04/2022   CL 95 (L) 08/04/2022   CO2 24 08/04/2022   BUN 20 08/04/2022   CREATININE 1.09 08/04/2022   GFRNONAA >60 08/04/2022   CALCIUM 10.2 08/04/2022   PROT 7.7 08/04/2022   ALBUMIN 4.4 08/04/2022   LABGLOB 2.7 02/11/2022   AGRATIO 1.7 02/11/2022   BILITOT 0.6 08/04/2022   ALKPHOS 65 08/04/2022   AST 26 08/04/2022   ALT 29 08/04/2022   ANIONGAP 11 08/04/2022   Last lipids Lab Results  Component Value Date   CHOL 215 (H) 02/11/2022   HDL 42 02/11/2022   LDLCALC 142 (H) 02/11/2022   TRIG 174 (H) 02/11/2022   CHOLHDL 5.1 (H) 02/11/2022   Last hemoglobin A1c Lab Results  Component Value Date   HGBA1C 8.2 05/17/2022   Last thyroid functions Lab Results  Component Value Date   TSH 0.900 02/11/2022   T3TOTAL 132 03/07/2019   Last vitamin D Lab Results  Component Value Date   VD25OH 30.6 02/11/2022       Objective     Today's Vitals   08/10/22 1110  BP: 111/70  Pulse: (Abnormal) 55  SpO2: 95%  Weight: 200 lb 12.8 oz (91.1 kg)  Height: 5' 8"$  (1.727 m)   Body mass index is 30.53 kg/m.  BP Readings from Last 3 Encounters:  08/10/22 111/70  08/04/22 132/83  07/22/22 126/68    Wt Readings from Last 3 Encounters:  08/10/22 200 lb 12.8 oz (91.1 kg)  08/04/22 213 lb 3 oz (96.7 kg)  07/22/22 213 lb 1.3 oz (96.7 kg)    Physical Exam Vitals and nursing note reviewed.  Constitutional:      Appearance: Normal appearance. He is well-developed.  HENT:     Head: Normocephalic and atraumatic.  Eyes:     General:        Right eye: No discharge.        Left eye: No discharge.     Extraocular Movements: Extraocular movements intact.     Conjunctiva/sclera: Conjunctivae normal.     Pupils: Pupils are equal, round, and reactive to light.   Cardiovascular:     Rate and Rhythm: Normal rate and regular rhythm.     Pulses: Normal pulses.     Heart sounds: Normal heart sounds.     Comments: Soft, blowing, systolic murmur noted  Pulmonary:     Effort: Pulmonary effort is normal.     Breath sounds: Normal breath sounds.  Abdominal:     Palpations: Abdomen is soft.  Musculoskeletal:        General: Normal range of motion.     Cervical back: Normal range of motion and neck supple.  Lymphadenopathy:     Cervical: No cervical adenopathy.  Skin:    General: Skin is warm and dry.     Capillary Refill: Capillary refill takes less than 2 seconds.  Neurological:     General: No focal deficit present.  Mental Status: He is alert and oriented to person, place, and time. He is confused.     Cranial Nerves: No dysarthria or facial asymmetry.     Sensory: Sensation is intact.     Motor: Motor function is intact. No weakness.     Coordination: Coordination abnormal.     Gait: Gait is intact.     Comments: Intermittent expressive aphasia -confusion regarding situation and location  -describing moderate visual disturbance, mostly on the right side of visual field.    Psychiatric:        Mood and Affect: Mood normal.        Behavior: Behavior normal.        Thought Content: Thought content normal.        Judgment: Judgment normal.       Assessment & Plan    1. Lacunar infarction [facility]) Reviewed CT done on 08/04/2022 which shows chronic lacunar infarct. Unclear if this is contributing to new symptoms. Urgent referral made to neurology made for further evaluation.  - Ambulatory referral to Neurology  2. Cerebrovascular disease CT also showing microvascular changes in brain. Urgent referral to neurology made today  - Ambulatory referral to Neurology  3. Disorientation Along with expressive aphasia. Urgent referral to neurology for further evaluation.  - Ambulatory referral to Neurology  4. Visual disturbance as complication  of stroke Most recent eye exam was good. Symptoms have started since last visit to eye doctor. Recommend visit to eye doctor ASAP.   5. Type 2 diabetes mellitus with hyperglycemia, without long-term current use of insulin ([name]) Samples of Mounjaro 2.5 mg weekly given to patient. He should continue with janumet as prescribed. Recheck sugar at next visit 08/17/2022.    Problem List Items Addressed This Visit       Nervous and Auditory   Lacunar infarction [facility]) - Primary   Relevant Orders   Ambulatory referral to Neurology     Other   Visual disturbance as complication of stroke   Other Visit Diagnoses     Cerebrovascular disease       Relevant Orders   Ambulatory referral to Neurology   Disorientation       Relevant Orders   Ambulatory referral to Neurology   Type 2 diabetes mellitus with hyperglycemia, without long-term current use of insulin ([place])            Return for as scheduled.     Medication Samples have been provided to the patient. d Drug name: Darcel Bayley       Strength: 2.5 mg        Qty: 1 box   LOT: [id number]    Exp.Date: 03/10/2024  Dosing instructions: inject  2.5 mg [place] weekly   The patient has been instructed regarding the correct time, dose, and frequency of taking this medication, including desired effects and most common side effects.   Greer Ee Damarrion Mimbs 1:01 PM 08/10/2022     Ronnell Freshwater, NP  [facility] [phone] (phone) [phone] (fax)  [place]

## 2022-08-10 NOTE — Telephone Encounter (Addendum)
Contacted pt to inform him the [facility] called back and said Dr. Michele Mcalpine said that pt should go to ED with the current symptoms that he is experiencing. Suggested he go to [facility] since they will have all the imaging equipment that may be needed. Sarah-Jane Nazario Zimmerman Rumple, CMA

## 2022-08-10 NOTE — ED Notes (Signed)
Patient transported to MRI 

## 2022-08-10 NOTE — Progress Notes (Deleted)
Initial neurology clinic note  Russell Robles MRN: [id number] DOB: 01/10/1961  Referring provider: Fransico Meadow, MD  Primary care provider: Ronnell Freshwater, NP  Reason for consult:  confusion, dizziness, blurry vision  Subjective:  This is [name], a 62 y.o. ***-handed male with a medical history of HTN, HLD, DM*** who presents to neurology clinic with ***. The patient is accompanied by ***.  ***  Patient has been out of work since 08/04/22 with confusion (forgets simple instructions, forgetting words, thought process off), blurry vision (everything moving to the right), dizziness.  He went to ED on 08/04/22. CT head there showed chronic left basal ganglia lacunar infarct, but no acute abnormality. His glucose at the ED was 510. He had been off his DM medications for about 4 days. He followed up with PCP on 08/10/22 with continued symptoms, so patient was sent to the ED. ***  MEDICATIONS:  Outpatient Encounter Medications as of 08/11/2022  Medication Sig   amLODipine-valsartan (EXFORGE) 5-160 MG tablet TAKE 1 TABLET BY MOUTH DAILY. (TAKE WITH HCTZ 12.5 MG TABLET)   Cholecalciferol (VITAMIN D3) 125 MCG (5000 UT) TABS 5,000 IU OTC vitamin D3 daily.   Cyanocobalamin (VITAMIN B12 PO) Take 1 tablet by mouth daily.   hydrochlorothiazide (HYDRODIURIL) 12.5 MG tablet TAKE 1 TABLET BY MOUTH DAILY. (TAKE WITH AMLODIPINE-VALSARTAN)   meclizine (ANTIVERT) 12.5 MG tablet Take 1 tablet (12.5 mg total) by mouth 2 (two) times daily as needed for dizziness.   methylPREDNISolone (MEDROL) 4 MG TBPK tablet Take by mouth as directed for 6 days   metoprolol succinate (TOPROL-XL) 50 MG 24 hr tablet Take 1 tablet (50 mg total) by mouth daily. TAKE WITH OR IMMEDIATELY FOLLOWING A MEAL.   simvastatin (ZOCOR) 80 MG tablet Take 1 tablet (80 mg total) by mouth at bedtime.   sitaGLIPtin-metformin (JANUMET) 50-500 MG tablet Take 1 tablet by mouth 2 (two) times daily with a meal.    tirzepatide (MOUNJARO) 5 MG/0.5ML Pen Inject 5 mg into the skin once a week.   triamcinolone ointment (KENALOG) 0.1 % Apply 1 application topically 2 (two) times daily.   valACYclovir (VALTREX) 1000 MG tablet Take 1 tablet (1,000 mg total) by mouth 2 (two) times daily.   No facility-administered encounter medications on file as of 08/11/2022.    PAST MEDICAL HISTORY: Past Medical History:  Diagnosis Date   Anxiety    Arthritis    Cough    PER PT NON-PRODUCTIVE , NO FEVER OR CONGESTION   Hernia, inguinal, right    Hyperlipidemia    Hypertension    Type 2 diabetes mellitus ([place])    last  HbA1c 7.2 on 05-09-2017 in epic    PAST SURGICAL HISTORY: Past Surgical History:  Procedure Laterality Date   CARPAL TUNNEL RELEASE Left 08/02/2018   Procedure: CARPAL TUNNEL RELEASE;  Surgeon: Thornton Park, MD;  Location: [facility];  Service: Orthopedics;  Laterality: Left;   HERNIA REPAIR N/A    Phreesia 05/18/2020   INGUINAL HERNIA REPAIR Left 12-16-2010  dr Donne Hazel  [place] Right 06/01/2017   Procedure: OPEN RIGHT INGUINAL HERNIA REPAIR WITH MESH;  Surgeon: Kinsinger, Arta Bruce, MD;  Location: [facility];  Service: General;  Laterality: Right;  GENERAL AND TAP BLOCK   INSERTION OF MESH Right 06/01/2017   Procedure: INSERTION OF MESH;  Surgeon: Kinsinger, Arta Bruce, MD;  Location: [facility];  Service: General;  Laterality: Right;  GENERAL AND TAP  BLOCK    ALLERGIES: No Known Allergies  FAMILY HISTORY: Family History  Problem Relation Age of Onset   COPD Mother    Aortic aneurysm Father    Lung cancer Sister    Colon cancer Neg Hx    Colon polyps Neg Hx    Esophageal cancer Neg Hx    Rectal cancer Neg Hx    Stomach cancer Neg Hx     SOCIAL HISTORY: Social History   Tobacco Use   Smoking status: Never   Smokeless tobacco: Never  Vaping Use   Vaping Use: Never used  Substance Use Topics   Alcohol use: Not Currently     Comment: occ   Drug use: No   Social History   Social History Narrative   Not on file    Objective:  Vital Signs:  There were no vitals taken for this visit.  ***  Labs and Imaging review: Internal labs: Lab Results  Component Value Date   HGBA1C 8.2 05/17/2022   No results found for: "VITAMINB12" Lab Results  Component Value Date   TSH 0.900 02/11/2022   No results found for: "ESRSEDRATE", "POCTSEDRATE"  CMP (08/04/22) significant for sodium of 130, glucose of 510   Imaging: CT head wo contrast (08/04/22): Per my read: Hypodensity in left basal ganglia could represent lacunar infarct though it is near sulci  Radiology read: FINDINGS: Brain: There is mild cerebral atrophy with widening of the extra-axial spaces and ventricular dilatation. There are areas of decreased attenuation within the white matter tracts of the supratentorial brain, consistent with microvascular disease changes.   A chronic left basal ganglia lacunar infarct is noted.   Vascular: No hyperdense vessel or unexpected calcification.   Skull: Normal. Negative for fracture or focal lesion.   Sinuses/Orbits: There is mild left ethmoid sinus mucosal thickening.   Other: None.   IMPRESSION: 1. No acute intracranial abnormality. 2. Chronic left basal ganglia lacunar infarct. 3. Generalized cerebral atrophy with chronic microvascular disease changes of the supratentorial brain. 4. Mild left ethmoid sinus disease.  Assessment/Plan:  Russell Robles is a 62 y.o. male who presents for evaluation of ***. *** has a relevant medical history of ***. *** neurological examination is pertinent for ***. Available diagnostic data is significant for ***. This constellation of symptoms and objective data would most likely localize to ***. ***  PLAN: -Blood work: *** ***  -Return to clinic ***  The impression above as well as the plan as outlined below were extensively discussed with the patient  (in the company of ***) who voiced understanding. All questions were answered to their satisfaction.  The patient was counseled on pertinent fall precautions per the printed material provided today, and as noted under the "Patient Instructions" section below.***  When available, results of the above investigations and possible further recommendations will be communicated to the patient via telephone/MyChart. Patient to call office if not contacted after expected testing turnaround time.   Total time spent reviewing records, interview, history/exam, documentation, and coordination of care on day of encounter:  *** min   Thank you for allowing me to participate in patient's care.  If I can answer any additional questions, I would be pleased to do so.  Kai Levins, MD   CC: Ronnell Freshwater, NP [place] [id number]  CC: Referring provider: Fransico Meadow, MD [address] [place],  [place] [address]

## 2022-08-10 NOTE — ED Provider Triage Note (Signed)
Emergency Medicine Provider Triage Evaluation Note  Russell Robles , a 62 y.o. male  was evaluated in triage.  Pt complains of right eye blurriness, disorientation.  [facility] same complaint of right eye blurriness.  Patient was referred to neurology after receiving unremarkable workup to include CT scan of head.  Patient here with wife reports the patient will seem disoriented from time to time.  The patient will go to the bathroom however will stand in the doorway and not remember why he was going to the bathroom.  On examination the patient is alert to self, place.  Patient disoriented to time, cannot tell me name of the president, cannot tell me the month we are in, cannot tell me his home address.  Patient denies headache, neck pain, one-sided weakness or numbness, slurred speech, facial droop.  Patient denies chest pain, shortness of breath, nausea or vomiting, fevers.  Patient denies dysuria.  Review of Systems  Positive:  Negative:   Physical Exam  BP (!) 122/99 (BP Location: Right Arm)   Pulse 64   Temp (!) 97.5 F (36.4 C) (Oral)   Resp 16   SpO2 90%  Gen:   Awake, no distress   Resp:  Normal effort  MSK:   Moves extremities without difficulty  Other:  Normal neurological examination  Medical Decision Making  Medically screening exam initiated at 4:27 PM.  Appropriate orders placed.  Fleming Matel was informed that the remainder of the evaluation will be completed by another provider, this initial triage assessment does not replace that evaluation, and the importance of remaining in the ED until their evaluation is complete.     Azucena Cecil, PA-C 08/10/22 1628

## 2022-08-11 ENCOUNTER — Other Ambulatory Visit (HOSPITAL_COMMUNITY): Payer: Self-pay

## 2022-08-11 ENCOUNTER — Ambulatory Visit: Payer: No Typology Code available for payment source | Admitting: Neurology

## 2022-08-11 ENCOUNTER — Emergency Department (HOSPITAL_COMMUNITY): Payer: No Typology Code available for payment source

## 2022-08-11 DIAGNOSIS — M1990 Unspecified osteoarthritis, unspecified site: Secondary | ICD-10-CM | POA: Diagnosis present

## 2022-08-11 DIAGNOSIS — R404 Transient alteration of awareness: Secondary | ICD-10-CM | POA: Diagnosis not present

## 2022-08-11 DIAGNOSIS — R0789 Other chest pain: Secondary | ICD-10-CM | POA: Diagnosis present

## 2022-08-11 DIAGNOSIS — E1169 Type 2 diabetes mellitus with other specified complication: Secondary | ICD-10-CM

## 2022-08-11 DIAGNOSIS — E1159 Type 2 diabetes mellitus with other circulatory complications: Secondary | ICD-10-CM | POA: Diagnosis not present

## 2022-08-11 DIAGNOSIS — G40209 Localization-related (focal) (partial) symptomatic epilepsy and epileptic syndromes with complex partial seizures, not intractable, without status epilepticus: Secondary | ICD-10-CM | POA: Diagnosis not present

## 2022-08-11 DIAGNOSIS — Z79899 Other long term (current) drug therapy: Secondary | ICD-10-CM | POA: Diagnosis not present

## 2022-08-11 DIAGNOSIS — I152 Hypertension secondary to endocrine disorders: Secondary | ICD-10-CM

## 2022-08-11 DIAGNOSIS — E1165 Type 2 diabetes mellitus with hyperglycemia: Secondary | ICD-10-CM

## 2022-08-11 DIAGNOSIS — Z825 Family history of asthma and other chronic lower respiratory diseases: Secondary | ICD-10-CM | POA: Diagnosis not present

## 2022-08-11 DIAGNOSIS — Z91141 Patient's other noncompliance with medication regimen due to financial hardship: Secondary | ICD-10-CM | POA: Diagnosis not present

## 2022-08-11 DIAGNOSIS — N3000 Acute cystitis without hematuria: Secondary | ICD-10-CM | POA: Diagnosis not present

## 2022-08-11 DIAGNOSIS — R4701 Aphasia: Secondary | ICD-10-CM | POA: Diagnosis present

## 2022-08-11 DIAGNOSIS — R410 Disorientation, unspecified: Secondary | ICD-10-CM

## 2022-08-11 DIAGNOSIS — E785 Hyperlipidemia, unspecified: Secondary | ICD-10-CM | POA: Diagnosis present

## 2022-08-11 DIAGNOSIS — E876 Hypokalemia: Secondary | ICD-10-CM | POA: Diagnosis present

## 2022-08-11 DIAGNOSIS — Z597 Insufficient social insurance and welfare support: Secondary | ICD-10-CM | POA: Diagnosis not present

## 2022-08-11 DIAGNOSIS — Z6832 Body mass index (BMI) 32.0-32.9, adult: Secondary | ICD-10-CM | POA: Diagnosis not present

## 2022-08-11 DIAGNOSIS — E669 Obesity, unspecified: Secondary | ICD-10-CM | POA: Diagnosis present

## 2022-08-11 DIAGNOSIS — D72829 Elevated white blood cell count, unspecified: Secondary | ICD-10-CM | POA: Diagnosis present

## 2022-08-11 DIAGNOSIS — R569 Unspecified convulsions: Secondary | ICD-10-CM | POA: Diagnosis not present

## 2022-08-11 DIAGNOSIS — R001 Bradycardia, unspecified: Secondary | ICD-10-CM | POA: Diagnosis present

## 2022-08-11 DIAGNOSIS — T383X6A Underdosing of insulin and oral hypoglycemic [antidiabetic] drugs, initial encounter: Secondary | ICD-10-CM | POA: Diagnosis present

## 2022-08-11 DIAGNOSIS — N390 Urinary tract infection, site not specified: Secondary | ICD-10-CM | POA: Diagnosis present

## 2022-08-11 DIAGNOSIS — G40909 Epilepsy, unspecified, not intractable, without status epilepticus: Secondary | ICD-10-CM | POA: Diagnosis present

## 2022-08-11 DIAGNOSIS — Z7984 Long term (current) use of oral hypoglycemic drugs: Secondary | ICD-10-CM | POA: Diagnosis not present

## 2022-08-11 DIAGNOSIS — F419 Anxiety disorder, unspecified: Secondary | ICD-10-CM | POA: Diagnosis present

## 2022-08-11 DIAGNOSIS — K5900 Constipation, unspecified: Secondary | ICD-10-CM | POA: Diagnosis not present

## 2022-08-11 DIAGNOSIS — Z801 Family history of malignant neoplasm of trachea, bronchus and lung: Secondary | ICD-10-CM | POA: Diagnosis not present

## 2022-08-11 DIAGNOSIS — R079 Chest pain, unspecified: Secondary | ICD-10-CM | POA: Diagnosis not present

## 2022-08-11 DIAGNOSIS — Z7985 Long-term (current) use of injectable non-insulin antidiabetic drugs: Secondary | ICD-10-CM | POA: Diagnosis not present

## 2022-08-11 DIAGNOSIS — H5500 Unspecified nystagmus: Secondary | ICD-10-CM | POA: Diagnosis not present

## 2022-08-11 LAB — CBG MONITORING, ED
Glucose-Capillary: 277 mg/dL — ABNORMAL HIGH (ref 70–99)
Glucose-Capillary: 343 mg/dL — ABNORMAL HIGH (ref 70–99)

## 2022-08-11 LAB — MAGNESIUM: Magnesium: 1.9 mg/dL (ref 1.7–2.4)

## 2022-08-11 LAB — BASIC METABOLIC PANEL
Anion gap: 9 (ref 5–15)
BUN: 12 mg/dL (ref 8–23)
CO2: 23 mmol/L (ref 22–32)
Calcium: 8.8 mg/dL — ABNORMAL LOW (ref 8.9–10.3)
Chloride: 102 mmol/L (ref 98–111)
Creatinine, Ser: 1.06 mg/dL (ref 0.61–1.24)
GFR, Estimated: >60 mL/min (ref >60)
Glucose, Bld: 346 mg/dL — ABNORMAL HIGH (ref 70–99)
Potassium: 3.8 mmol/L (ref 3.5–5.1)
Sodium: 134 mmol/L — ABNORMAL LOW (ref 135–145)

## 2022-08-11 LAB — GLUCOSE, CAPILLARY
Glucose-Capillary: 286 mg/dL — ABNORMAL HIGH (ref 70–99)
Glucose-Capillary: 133 mg/dL — ABNORMAL HIGH (ref 70–99)

## 2022-08-11 LAB — CK: Total CK: 38 U/L — ABNORMAL LOW (ref 49–397)

## 2022-08-11 LAB — HIV ANTIBODY (ROUTINE TESTING W REFLEX): HIV Screen 4th Generation wRfx: NONREACTIVE

## 2022-08-11 MED ORDER — ONDANSETRON HCL 4 MG/2ML IJ SOLN: 4.0000 mg | Freq: Four times a day (QID) | INTRAMUSCULAR | Status: DC | PRN

## 2022-08-11 MED ORDER — ALBUTEROL SULFATE (2.5 MG/3ML) 0.083% IN NEBU: 2.5000 mg | INHALATION_SOLUTION | RESPIRATORY_TRACT | Status: DC | PRN

## 2022-08-11 MED ORDER — SODIUM CHLORIDE 0.9 % IV SOLN
200.0000 mg | Freq: Once | INTRAVENOUS | Status: AC
  Administered 2022-08-11: 200 mg via INTRAVENOUS
  Filled 2022-08-11: qty 20

## 2022-08-11 MED ORDER — ENOXAPARIN SODIUM 40 MG/0.4ML IJ SOSY
40.0000 mg | PREFILLED_SYRINGE | INTRAMUSCULAR | Status: DC
  Administered 2022-08-11 – 2022-08-17 (×7): 40 mg via SUBCUTANEOUS
  Filled 2022-08-11 (×7): qty 0.4

## 2022-08-11 MED ORDER — PERAMPANEL 2 MG PO TABS
8.0000 mg | ORAL_TABLET | Freq: Once | ORAL | Status: AC
  Administered 2022-08-11: 8 mg via ORAL
  Filled 2022-08-11: qty 4

## 2022-08-11 MED ORDER — SODIUM CHLORIDE 0.9 % IV SOLN
100.0000 mg | Freq: Two times a day (BID) | INTRAVENOUS | Status: DC
  Administered 2022-08-12 – 2022-08-13 (×4): 100 mg via INTRAVENOUS
  Filled 2022-08-11 (×6): qty 10

## 2022-08-11 MED ORDER — SODIUM CHLORIDE 0.9 % IV SOLN
10.0000 mg/kg | Freq: Once | INTRAVENOUS | Status: AC
  Administered 2022-08-11: 911 mg via INTRAVENOUS
  Filled 2022-08-11: qty 18.22

## 2022-08-11 MED ORDER — INSULIN ASPART 100 UNIT/ML IJ SOLN
0.0000 [IU] | Freq: Every day | INTRAMUSCULAR | Status: DC
  Administered 2022-08-12: 3 [IU] via SUBCUTANEOUS
  Administered 2022-08-13: 2 [IU] via SUBCUTANEOUS
  Administered 2022-08-14: 4 [IU] via SUBCUTANEOUS
  Administered 2022-08-16: 3 [IU] via SUBCUTANEOUS

## 2022-08-11 MED ORDER — LEVETIRACETAM IN NACL 1000 MG/100ML IV SOLN
1000.0000 mg | Freq: Two times a day (BID) | INTRAVENOUS | Status: DC
  Administered 2022-08-11: 1000 mg via INTRAVENOUS
  Filled 2022-08-11: qty 100

## 2022-08-11 MED ORDER — SODIUM CHLORIDE 0.9 % IV SOLN
1.0000 g | INTRAVENOUS | Status: DC
  Administered 2022-08-11 – 2022-08-13 (×3): 1 g via INTRAVENOUS
  Filled 2022-08-11 (×4): qty 10

## 2022-08-11 MED ORDER — LORAZEPAM 2 MG/ML IJ SOLN
2.0000 mg | INTRAMUSCULAR | Status: AC
  Administered 2022-08-11: 2 mg via INTRAVENOUS
  Filled 2022-08-11: qty 1

## 2022-08-11 MED ORDER — ORAL CARE MOUTH RINSE
15.0000 mL | OROMUCOSAL | Status: DC
  Administered 2022-08-11 – 2022-08-12 (×7): 15 mL via OROMUCOSAL

## 2022-08-11 MED ORDER — LEVETIRACETAM IN NACL 1000 MG/100ML IV SOLN
1000.0000 mg | Freq: Once | INTRAVENOUS | Status: AC
  Administered 2022-08-11: 1000 mg via INTRAVENOUS

## 2022-08-11 MED ORDER — SODIUM CHLORIDE 0.9 % IV SOLN
75.0000 mL/h | INTRAVENOUS | Status: DC
  Administered 2022-08-11 – 2022-08-13 (×4): 75 mL/h via INTRAVENOUS

## 2022-08-11 MED ORDER — ATORVASTATIN CALCIUM 40 MG PO TABS
40.0000 mg | ORAL_TABLET | Freq: Every day | ORAL | Status: DC
  Administered 2022-08-11 – 2022-08-17 (×7): 40 mg via ORAL
  Filled 2022-08-11 (×7): qty 1

## 2022-08-11 MED ORDER — LORAZEPAM 2 MG/ML IJ SOLN: 1.0000 mg | INTRAMUSCULAR | Status: DC | PRN

## 2022-08-11 MED ORDER — PANTOPRAZOLE SODIUM 40 MG IV SOLR
40.0000 mg | INTRAVENOUS | Status: DC
  Administered 2022-08-11 – 2022-08-15 (×5): 40 mg via INTRAVENOUS
  Filled 2022-08-11 (×5): qty 10

## 2022-08-11 MED ORDER — PHENYTOIN SODIUM 50 MG/ML IJ SOLN
100.0000 mg | Freq: Three times a day (TID) | INTRAMUSCULAR | Status: DC
  Administered 2022-08-11 – 2022-08-12 (×2): 100 mg via INTRAVENOUS
  Filled 2022-08-11 (×3): qty 2

## 2022-08-11 MED ORDER — ORAL CARE MOUTH RINSE: 15.0000 mL | OROMUCOSAL | Status: DC | PRN

## 2022-08-11 MED ORDER — ONDANSETRON HCL 4 MG PO TABS: 4.0000 mg | ORAL_TABLET | Freq: Four times a day (QID) | ORAL | Status: DC | PRN

## 2022-08-11 MED ORDER — INSULIN ASPART 100 UNIT/ML IJ SOLN
0.0000 [IU] | Freq: Three times a day (TID) | INTRAMUSCULAR | Status: DC
  Administered 2022-08-11 (×2): 8 [IU] via SUBCUTANEOUS
  Administered 2022-08-11: 11 [IU] via SUBCUTANEOUS
  Administered 2022-08-12 (×3): 5 [IU] via SUBCUTANEOUS
  Administered 2022-08-13: 8 [IU] via SUBCUTANEOUS
  Administered 2022-08-13 (×2): 5 [IU] via SUBCUTANEOUS
  Administered 2022-08-14 (×2): 3 [IU] via SUBCUTANEOUS
  Administered 2022-08-14 – 2022-08-15 (×2): 2 [IU] via SUBCUTANEOUS
  Administered 2022-08-15: 3 [IU] via SUBCUTANEOUS
  Administered 2022-08-15: 5 [IU] via SUBCUTANEOUS
  Administered 2022-08-16: 3 [IU] via SUBCUTANEOUS
  Administered 2022-08-16: 5 [IU] via SUBCUTANEOUS
  Administered 2022-08-16: 3 [IU] via SUBCUTANEOUS
  Administered 2022-08-17: 5 [IU] via SUBCUTANEOUS
  Administered 2022-08-17: 2 [IU] via SUBCUTANEOUS

## 2022-08-11 MED ORDER — INSULIN GLARGINE-YFGN 100 UNIT/ML ~~LOC~~ SOLN
15.0000 [IU] | Freq: Every day | SUBCUTANEOUS | Status: DC
  Administered 2022-08-11: 15 [IU] via SUBCUTANEOUS
  Filled 2022-08-11 (×2): qty 0.15

## 2022-08-11 MED ORDER — SODIUM CHLORIDE 0.9 % IV SOLN: 3000.0000 mg | INTRAVENOUS | Status: DC

## 2022-08-11 NOTE — Progress Notes (Signed)
This is a 62 year old male who has been having altered mentation and episodes of staring off his face for less than 1 second.  This has been ongoing for a little bit over a week.  He was seen in the ER recently and outpatient neurology follow-up recommended.  Today had a routine PCP visit, with the same recurred.  He was redirected to the ER.  In the ER this has been witnessed by several physicians, including neurology.  Recommendations have been made.  EEG ordered, hold off on anticonvulsants until the EEG is done recommends glucose control.

## 2022-08-11 NOTE — Plan of Care (Signed)
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 [facility] EM[facility] did not see any definite electrographic correlate. Per Dr. Karolee Stamps plan, Vimpat has been added to his regimen.   Electronically signed: Dr. Kerney Elbe

## 2022-08-11 NOTE — ED Notes (Signed)
EEG monitoring tech notified for assistance to take upstairs pt still having active seeen seizures on monitoring

## 2022-08-11 NOTE — Progress Notes (Signed)
LTM EEG hooked up and running - no initial skin breakdown - push button tested - No [facility] in the ED.

## 2022-08-11 NOTE — Progress Notes (Addendum)
Inpatient Diabetes Program Recommendations  AACE/ADA: New Consensus Statement on Inpatient Glycemic Control (2015)  Target Ranges:  Prepandial:   less than 140 mg/dL      Peak postprandial:   less than 180 mg/dL (1-2 hours)      Critically ill patients:  140 - 180 mg/dL   Lab Results  Component Value Date   GLUCAP 277 (H) 08/11/2022   HGBA1C 13.1 (H) 08/10/2022    Review of Glycemic Control  Latest Reference Range & Units 08/10/22 20:32 08/10/22 23:50 08/11/22 08:15  Glucose-Capillary 70 - 99 mg/dL 459 (H) 332 (H) 277 (H)   Diabetes history: DM 2 Outpatient Diabetes medications:  Janumet 50-500 mg bid Mounjaro 5 mg weekly (08/10/22-started)  Current orders for Inpatient glycemic control:  Novolog 0-15 tid with meals and HS Semglee 15 units daily  Inpatient Diabetes Program Recommendations:    Agree with start of basal insulin. A1C is 13.1% indicating very poor control of blood sugars.  Unclear how long he has been off of GLP-1.  Will talk to patient today.   Addendum:  Spoke with patient's wife at bedside regarding DM and current glucose management.  She states that patient was doing fairly well on Ozempic.  In November of 2023, A1C was 8.2% on Ozempic and provider increased dose to 1 mg weekly.  After the first of the year (2024), patient got new insurance plan through his job.  Insurance denied coverage of Ozempic and Janumet. She states that blood sugars have been in the 300-500 mg/dL range without medications.  Patient was off all DM medications in January and February until [facility] restarted yesterday by PCP.  It is still unknown if they will cover the [facility].  Requested a benefits check on GLP-s, CGM's and insulins from pharmacy.  Explained to wife that patient will likely need insulin at discharge based on markedly elevated A1C and blood sugars.  Wife is agreeable.  She asked about CGM and about making sure that whatever is ordered, is covered by insurance.  She is upset and  feels like this could have been prevented if insurance had covered the medications he needed for his DM.  Will follow- up on 2/23.   Thanks,  Adah Perl, RN, BC-ADM Inpatient Diabetes Coordinator Pager [phone]  (8a-5p)

## 2022-08-11 NOTE — Consult Note (Addendum)
NEURO HOSPITALIST CONSULT NOTE   Requesting physician: Dr. Leonette Monarch  Reason for Consult: Oscillopsia  History obtained from:  Patient, Wife and Chart     HPI:                                                                                                                                          Russell Robles is an 62 y.o. male with a PMHx of anxiety, arthritis, HTN, HLD and DM2 who presented to the ED on Wednesday afternoon for evaluation of a two week history of episodic oscillopsia, manifesting as the sensation of his vision moving to the right continuously, no matter what he was looking at. The episodes would last from seconds to minutes. He explained the sensation to his wife as similar to the effect of the picture moving sideways when watching a projector-movie with a broken film reel. Wife had also witnessed him suddenly freezing at home with a blank stare.   He was evaluated at [facility] on 2/15 for the symptoms. At that visit CT head was negative and lab work reassuring. He was discharged home with outpatient PCP and Neurology follow ups planned.   Of note, patient's wife states that his symptoms occurred in parallel with worsening home blood glucose readings that had started to go up significantly, eventually approximately doubling, after he was switched from [place] to [place] due to insurance refusal to cover the former, despite its success in managing his blood glucose levels, after his employer switched health insurance plans from [facility] to [place].   Past Medical History:  Diagnosis Date   Anxiety    Arthritis    Cough    PER PT NON-PRODUCTIVE , NO FEVER OR CONGESTION   Hernia, inguinal, right    Hyperlipidemia    Hypertension    Type 2 diabetes mellitus ([place])    last  HbA1c 7.2 on 05-09-2017 in epic    Past Surgical History:  Procedure Laterality Date   CARPAL TUNNEL RELEASE Left 08/02/2018   Procedure: CARPAL TUNNEL RELEASE;  Surgeon:  Thornton Park, MD;  Location: [facility];  Service: Orthopedics;  Laterality: Left;   HERNIA REPAIR N/A    Phreesia 05/18/2020   INGUINAL HERNIA REPAIR Left 12-16-2010  dr Donne Hazel  [place] Right 06/01/2017   Procedure: OPEN RIGHT INGUINAL HERNIA REPAIR WITH MESH;  Surgeon: Kinsinger, Arta Bruce, MD;  Location: [facility];  Service: General;  Laterality: Right;  GENERAL AND TAP BLOCK   INSERTION OF MESH Right 06/01/2017   Procedure: INSERTION OF MESH;  Surgeon: Kinsinger, Arta Bruce, MD;  Location: [facility];  Service: General;  Laterality: Right;  GENERAL AND TAP BLOCK    Family History  Problem Relation Age of  Onset   COPD Mother    Aortic aneurysm Father    Lung cancer Sister    Colon cancer Neg Hx    Colon polyps Neg Hx    Esophageal cancer Neg Hx    Rectal cancer Neg Hx    Stomach cancer Neg Hx               Social History:  reports that he has never smoked. He has never used smokeless tobacco. He reports that he does not currently use alcohol. He reports that he does not use drugs.  No Known Allergies  MEDICATIONS:                                                                                                                     No current facility-administered medications on file prior to encounter.   Current Outpatient Medications on File Prior to Encounter  Medication Sig Dispense Refill   amLODipine-valsartan (EXFORGE) 5-160 MG tablet TAKE 1 TABLET BY MOUTH DAILY. (TAKE WITH HCTZ 12.5 MG TABLET) 90 tablet 1   Cholecalciferol (VITAMIN D3) 125 MCG (5000 UT) TABS 5,000 IU OTC vitamin D3 daily. 90 tablet 3   Cyanocobalamin (VITAMIN B12 PO) Take 1 tablet by mouth daily.     hydrochlorothiazide (HYDRODIURIL) 12.5 MG tablet TAKE 1 TABLET BY MOUTH DAILY. (TAKE WITH AMLODIPINE-VALSARTAN) 90 tablet 1   meclizine (ANTIVERT) 12.5 MG tablet Take 1 tablet (12.5 mg total) by mouth 2 (two) times daily as needed for dizziness. 30  tablet 0   methylPREDNISolone (MEDROL) 4 MG TBPK tablet Take by mouth as directed for 6 days 21 tablet 0   metoprolol succinate (TOPROL-XL) 50 MG 24 hr tablet Take 1 tablet (50 mg total) by mouth daily. TAKE WITH OR IMMEDIATELY FOLLOWING A MEAL. 90 tablet 1   simvastatin (ZOCOR) 80 MG tablet Take 1 tablet (80 mg total) by mouth at bedtime. 90 tablet 1   sitaGLIPtin-metformin (JANUMET) 50-500 MG tablet Take 1 tablet by mouth 2 (two) times daily with a meal. 60 tablet 2   tirzepatide (MOUNJARO) 5 MG/0.5ML Pen Inject 5 mg into the skin once a week. 6 mL 1   triamcinolone ointment (KENALOG) 0.1 % Apply 1 application topically 2 (two) times daily. 80 g 2   valACYclovir (VALTREX) 1000 MG tablet Take 1 tablet (1,000 mg total) by mouth 2 (two) times daily. 20 tablet 0      ROS:  As per HPI. Does not endorse any additional symptoms at this time.    Blood pressure 105/62, pulse (!) 51, temperature 97.6 F (36.4 C), resp. rate (!) 22, SpO2 92 %.   General Examination:                                                                                                       Physical Exam  HEENT-  [place]/AT    Lungs- Respirations unlabored Extremities- No edema  Neurological Examination Mental Status: Alert, oriented x 5, thought content appropriate.  Speech fluent without evidence of aphasia.  Able to follow all commands without difficulty. Cranial Nerves: II: Intermittent right sided extinction to DSS. With visual quadrants tested in each eye individually, there is no field cut. PERRL  III,IV, VI: No ptosis. EOMI. Positive for gaze evoked nystagmus when looking sharply to the right. V: Temp sensation equal bilaterally  VII: Smile symmetric VIII: Hearing intact to voice IX,X: No hoarseness XI: Symmetric shoulder shrug XII: Midline tongue extension Motor: BUE 5/5  proximally and distally BLE 5/5 proximally and distally  No pronator drift.  Sensory: Temp and light touch intact throughout, bilaterally. No extinction to DSS.  Deep Tendon Reflexes: 2+ and symmetric throughout Cerebellar: No ataxia with FNF bilaterally  Gait: Deferred Other: He experienced 2 short spells of awake unresponsiveness during the exam, each lasting for about 10-15 seconds, during which his head and eyes turned to the right with lip-smacking automatism.    Lab Results: Basic Metabolic Panel: Recent Labs  Lab 08/04/22 1551 08/10/22 2108 08/10/22 2135  NA 130* 134* 134*  K 4.2 3.8 3.6  CL 95* 98 97*  CO2 24 25  --   GLUCOSE 510* 407* 390*  BUN 20 18 21  $ CREATININE 1.09 1.17 1.00  CALCIUM 10.2 10.0  --     CBC: Recent Labs  Lab 08/04/22 1551 08/10/22 2108 08/10/22 2135  WBC 10.2 11.0*  --   NEUTROABS 5.5 5.3  --   HGB 15.8 16.1 16.7  HCT 44.4 46.1 49.0  MCV 81.5 84.3  --   PLT 306 283  --     Cardiac Enzymes: No results for input(s): "CKTOTAL", "CKMB", "CKMBINDEX", "TROPONINI" in the last 168 hours.  Lipid Panel: No results for input(s): "CHOL", "TRIG", "HDL", "CHOLHDL", "VLDL", "LDLCALC" in the last 168 hours.  Imaging: MR BRAIN WO CONTRAST  Result Date: 08/10/2022 CLINICAL DATA:  Blurry vision EXAM: MRI HEAD WITHOUT CONTRAST MRA HEAD WITHOUT CONTRAST TECHNIQUE: Multiplanar, multi-echo pulse sequences of the brain and surrounding structures were acquired without intravenous contrast. Angiographic images of the Circle of Willis were acquired using MRA technique without intravenous contrast. COMPARISON:  None Available. FINDINGS: MRI HEAD FINDINGS Only diffusion-weighted imaging was able to be performed. There is no acute infarct or extra-axial collection. MRA HEAD FINDINGS POSTERIOR CIRCULATION: --Vertebral arteries: Normal --Inferior cerebellar arteries: Normal. --Basilar artery: Normal. --Superior cerebellar arteries: Normal. --Posterior cerebral arteries:  Normal. ANTERIOR CIRCULATION: --Intracranial internal carotid arteries: Normal. --Anterior cerebral arteries (ACA): Normal. --Middle cerebral arteries (MCA): Normal. IMPRESSION: 1. No acute infarct. Normal diffusion-weighted  imaging of the brain. 2. Normal intracranial MRA. Electronically Signed   By: Ulyses Jarred M.D.   On: 08/10/2022 22:39   MR ANGIO HEAD WO CONTRAST  Result Date: 08/10/2022 CLINICAL DATA:  Blurry vision EXAM: MRI HEAD WITHOUT CONTRAST MRA HEAD WITHOUT CONTRAST TECHNIQUE: Multiplanar, multi-echo pulse sequences of the brain and surrounding structures were acquired without intravenous contrast. Angiographic images of the Circle of Willis were acquired using MRA technique without intravenous contrast. COMPARISON:  None Available. FINDINGS: MRI HEAD FINDINGS Only diffusion-weighted imaging was able to be performed. There is no acute infarct or extra-axial collection. MRA HEAD FINDINGS POSTERIOR CIRCULATION: --Vertebral arteries: Normal --Inferior cerebellar arteries: Normal. --Basilar artery: Normal. --Superior cerebellar arteries: Normal. --Posterior cerebral arteries: Normal. ANTERIOR CIRCULATION: --Intracranial internal carotid arteries: Normal. --Anterior cerebral arteries (ACA): Normal. --Middle cerebral arteries (MCA): Normal. IMPRESSION: 1. No acute infarct. Normal diffusion-weighted imaging of the brain. 2. Normal intracranial MRA. Electronically Signed   By: Ulyses Jarred M.D.   On: 08/10/2022 22:39     Assessment: 62 year old male with intermittent spells of awake unresponsiveness and oscillopsia, with onset and worsening paralleling steadily worsening glucose levels in the setting of recent diabetes medication changes.  - Exam when fully awake and alert is nonfocal other than gaze evoked nystagmus when looking sharply to the right and intermittent right sided visual extinction to DSS. He experienced 2 short spells of awake unresponsiveness during the exam, each lasting for about  10-15 seconds, during which his head and eyes turned to the right with lip-smacking automatism.  - MRI brain: Abbreviated study due to patient inability to tolerated scanning. No acute infarct. Normal diffusion-weighted imaging of the brain. - MRA head: Normal intracranial MRA. 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 Based on head turning and eye deviation to the right during a spell witnessed by this examiner, the seizure focus is felt most likely to be within the left cerebral hemisphere.   Recommendations: - LTM EEG has been ordered - Will hold off on anticonvulsant for now, pending EEG results - Glycemic control - Will need Case Manager help in getting [employer] to reapprove full coverage of his Ozempic, which per home CBG documentation shown to this examiner, has been much more effective for his blood glucose control than Janumet.     Electronically signed: Dr. Kerney Elbe 08/11/2022, 4:56 AM

## 2022-08-11 NOTE — Progress Notes (Signed)
Subjective: Continuing to have intermittent seizures during which she becomes aphasic and has right upper extremity increased tone.  Per wife, his insurance covering Ozempic at the beginning of this year and therefore his blood sugar has been running as high as 500.  Denies any previous seizure history.  ROS: negative except above Examination  Vital signs in last 24 hours: Temp:  [97.5 F (36.4 C)-98.3 F (36.8 C)] 98.3 F (36.8 C) (02/22 1044) Pulse Rate:  [46-64] 55 (02/22 1130) Resp:  [15-25] 18 (02/22 1130) BP: (97-162)/(59-144) 108/67 (02/22 1130) SpO2:  [90 %-97 %] 92 % (02/22 1130)  General: lying in bed, NAD Neuro: MS: Alert, oriented, follows commands  CN: pupils equal and reactive,  EOMI, face symmetric, tongue midline, normal sensation over face, Motor: 5/5 strength in all 4 extremities  Patient had a seizure while I was in the room.  He started staring off, mainly looking towards the right but no forced gaze deviation, had increased tone in right upper extremity.  This lasted for over a minute.  After this patient was able to answer all questions and move all extremities.  Basic Metabolic Panel: Recent Labs  Lab 08/04/22 1551 08/10/22 2108 08/10/22 2135  NA 130* 134* 134*  K 4.2 3.8 3.6  CL 95* 98 97*  CO2 24 25  --   GLUCOSE 510* 407* 390*  BUN 20 18 21  $ CREATININE 1.09 1.17 1.00  CALCIUM 10.2 10.0  --     CBC: Recent Labs  Lab 08/04/22 1551 08/10/22 2108 08/10/22 2135  WBC 10.2 11.0*  --   NEUTROABS 5.5 5.3  --   HGB 15.8 16.1 16.7  HCT 44.4 46.1 49.0  MCV 81.5 84.3  --   PLT 306 283  --     Coagulation Studies: No results for input(s): "LABPROT", "INR" in the last 72 hours.  Imaging MRI brain without contrast and MRA head without contrast 08/10/2022: No acute infarct. Normal diffusion-weighted imaging of the brain. Normal intracranial MRA.  CT head without contrast 08/04/2022:  1.No acute intracranial abnormality. 2. Chronic left basal ganglia  lacunar infarct. 3. Generalized cerebral atrophy with chronic microvascular disease changes of the supratentorial brain. 4. Mild left ethmoid sinus disease.  ASSESSMENT AND PLAN: 62 year old male with intermittent confusion in the setting of blood glucose over 500.  EEG showed intermittent seizures arising from left posterior quadrant.  Provoked seizures in the setting of hyperglycemia -EEG continues to show multiple seizures arising from left posterior quadrant. -I requested nurse to give IV Ativan 2 mg this morning and ordered IV Keppra 3000 mg once. -On review of EEG 3 hours after finishing Keppra, patient continues to have intermittent seizures.  Therefore, will order IV fosphenytoin load -Also start Keppra 1000 mg twice daily for maintenance -Will start Dilantin maintenance of seizures improved -Next agent will be Vimpat -Defer optimization of blood glucose to medicine team -Will also check magnesium and CK levels -Continue seizure precautions -As needed IV Versed for clinical seizures lasting more than 5 minutes -Will likely need repeat MRI brain with Ativan as the previous MRI brain was not complete -Discussed plan in detail with patient and husband at bedside, Dr. Tamala Julian and RN via secure chat  CRITICAL CARE Performed by: Lora Havens   Total critical care time: 40 minutes  Critical care time was exclusive of separately billable procedures and treating other patients.  Critical care was necessary to treat or prevent imminent or life-threatening deterioration.  Critical care was time spent  personally by me on the following activities: development of treatment plan with patient and/or surrogate as well as nursing, discussions with consultants, evaluation of patient's response to treatment, examination of patient, obtaining history from patient or surrogate, ordering and performing treatments and interventions, ordering and review of laboratory studies, ordering and review of  radiographic studies, pulse oximetry and re-evaluation of patient's condition.   Zeb Comfort Epilepsy [facility] For questions after 5pm please refer to AMION to reach the Neurologist on call

## 2022-08-11 NOTE — ED Provider Notes (Signed)
Received at shift change from Domenic Moras [facility] please see his note for full detail  Presents with intermittent episodes of confusion and shifting vision, happens about 10-12 times a day, patient was seen by his primary care doctor today with another episode of shifting vision confusion, is advised to come here for further evaluation.  MRI imaging was unremarkable, UA is concerning for UTI with an elevated glucose.  Per previous provider await for neurology consultation for ultimate disposition.  Per neurology Dr. Cheral Marker recommends hospital admission, patient will need EEG, hold off on anticonvulsants until the EEG is done recommends glucose control.  Spoke with With Dr. Claria Dice who will admit the patient.       Marcello Fennel, PA-C 08/11/22 [id number]    Fatima Blank, MD 08/11/22 [phone]

## 2022-08-11 NOTE — TOC Benefit Eligibility Note (Signed)
Patient Teacher, English as a foreign language completed.    The patient is currently admitted and upon discharge could be taking Basaglar Pen.  The current 30 day co-pay is $35.00.   The patient is currently admitted and upon discharge could be taking Novolog Pen.  The current 30 day co-pay is $35.00.   The patient is currently admitted and upon discharge could be taking Trulicity [id number] Pen.  Requires Prior Authorization  The patient is insured through [place], [place] Patient Advocate Specialist [place] Patient Advocate Team Direct Number: [phone]  Fax: [phone]

## 2022-08-11 NOTE — Procedures (Signed)
Patient Name: Russell Robles  MRN: [id number]  Epilepsy Attending: Lora Havens  Referring Physician/Provider: Kerney Elbe, MD  Duration: 08/11/2022 0701 to 08/12/2022 0701  Patient history: 62 year old male who has been having altered mentation and episodes of staring off his face for less than 1 second. EEG to evaluate for seizure.  Level of alertness: Awake, asleep  AEDs during EEG study: LEV, LCM, PHT, Perampanel  Technical aspects: This EEG study was done with scalp electrodes positioned according to the 10-20 International system of electrode placement. Electrical activity was reviewed with band pass filter of 1-'70Hz'$ , sensitivity of 7 uV/mm, display speed of 29m/sec with a '60Hz'$  notched filter applied as appropriate. EEG data were recorded continuously and digitally stored.  Video monitoring was available and reviewed as appropriate.  Description: The posterior dominant rhythm consists of 8 Hz activity of moderate voltage (25-35 uV) seen predominantly in posterior head regions, symmetric and reactive to eye opening and eye closing. Sleep was characterized by vertex waves, sleep spindles (12 to 14 Hz), maximal frontocentral region.    Seizures were noted arising from left parieto-occipital region.  During seizure, patient was noted to be staring off, unable to respond or follow commands, at times with right upper extremity stiffness.  At the onset of seizure, EEG showed low amplitude 15 to 18 Hz beta activity in the right occipital region which then involved all of left hemisphere followed by right hemisphere.  EEG then evolved into sharply contoured 3 to 5 Hz theta-delta slowing.  Average 3 seizures were noted per hour, lasting about 2-4 minutes each.  Hyperventilation and photic stimulation were not performed.     ABNORMALITY -Focal seizures with impaired awareness, left parieto-occipital region  IMPRESSION: This study showed focal seizures arising from parieto-occipital region,  average 3 seizures per hour, lasting about 2 to 4 minutes each. During seizure, patient was noted to be staring off, unable to respond or follow commands, at times with right upper extremity stiffness.  [name] OBarbra Sarks

## 2022-08-11 NOTE — Progress Notes (Signed)
Went to ED 6 to get patient back online.  Sent message to Dr. Hortense Ramal to advise.

## 2022-08-11 NOTE — H&P (Signed)
History and Physical    Patient: Russell Robles [id number] DOB: September 29, 1960 DOA: 08/10/2022 DOS: the patient was seen and examined on 08/11/2022 PCP: Ronnell Freshwater, NP  Patient coming from: Home  Chief Complaint:  Chief Complaint  Patient presents with   Eye Problem   HPI: Russell Robles is a 62 y.o. male with medical history significant of hypertension, hyperlipidemia, diabetes mellitus type 2, anxiety who presents with episodes of confusion and garbled speech.  History is obtained from the patient's wife who is present at bedside as he is currently lethargic.  His wife notes that symptoms been present for at least the last 2 weeks.  He can be in the middle of doing something like putting something in the fridge and turned towards the fridge, but thereafter not know what to do.  She states that he can start talking and then the words that he saying do not make sense and sound like word salad.  She had not known any facial drooping or slurred speech.  However other times he will start looking towards the right and state everything is moving towards the right and she has noticed some intermittent mouth twitching on the right-hand side.  They come to the emergency department on 2/15 and had been evaluated with CT scan of the brain that showed no acute abnormality and a chronic left basal ganglia lacunar infarct.  He had been referred to neurology and his PCP.  However, symptoms progressively worsen and seem to be coming more frequent.  They had called his PCP and scheduled a televisit which his PCP witnessed one of the episodes and ultimately he was recommended to come to the emergency department.  His wife makes note that he had been without any of his diabetes medications since before Christmas due to previous insurance [employer] changing to [place].  His previous medications  had been denied due to requiring prior authorizations.  His blood sugars have been running in the  400-500s at home.  She reports Janumet had finally gotten prescribed couple days ago.  After his visit with the PCP he had also been given a 1 month supply of Mounjaro while trying to get it authorized.   In the emergency department patient was noted to be afebrile with pulse 46-64, blood pressures elevated up to 154/86, and other vital signs maintained.  Labs from 2/21 significant for WBC 11, sodium 134,  glucose 407, and hemoglobin A1c 13.1.  UDS was negative and alcohol level undetectable.  MRI of the brain and MRA of the head without contrast did not note any acute abnormalities.  Case had been discussed with neurology and EEG had been ordered.  Patient has been given 1 L normal saline IV fluids, 1 mg of Ativan, and NovoLog insulin 5 units.    Review of Systems: As mentioned in the history of present illness. All other systems reviewed and are negative. Past Medical History:  Diagnosis Date   Anxiety    Arthritis    Cough    PER PT NON-PRODUCTIVE , NO FEVER OR CONGESTION   Hernia, inguinal, right    Hyperlipidemia    Hypertension    Type 2 diabetes mellitus ([place])    last  HbA1c 7.2 on 05-09-2017 in epic   Past Surgical History:  Procedure Laterality Date   CARPAL TUNNEL RELEASE Left 08/02/2018   Procedure: CARPAL TUNNEL RELEASE;  Surgeon: Thornton Park, MD;  Location: [facility] ORS;  Service: Orthopedics;  Laterality: Left;  HERNIA REPAIR N/A    Phreesia 05/18/2020   INGUINAL HERNIA REPAIR Left 12-16-2010  dr Donne Hazel  [place] Right 06/01/2017   Procedure: OPEN RIGHT INGUINAL HERNIA REPAIR WITH MESH;  Surgeon: Kinsinger, Arta Bruce, MD;  Location: [facility];  Service: General;  Laterality: Right;  GENERAL AND TAP BLOCK   INSERTION OF MESH Right 06/01/2017   Procedure: INSERTION OF MESH;  Surgeon: Kinsinger, Arta Bruce, MD;  Location: [place] [facility];  Service: General;  Laterality: Right;  GENERAL AND TAP BLOCK   Social History:   reports that he has never smoked. He has never used smokeless tobacco. He reports that he does not currently use alcohol. He reports that he does not use drugs.  No Known Allergies  Family History  Problem Relation Age of Onset   COPD Mother    Aortic aneurysm Father    Lung cancer Sister    Colon cancer Neg Hx    Colon polyps Neg Hx    Esophageal cancer Neg Hx    Rectal cancer Neg Hx    Stomach cancer Neg Hx     Prior to Admission medications   Medication Sig Start Date End Date Taking? Authorizing Provider  amLODipine-valsartan (EXFORGE) 5-160 MG tablet TAKE 1 TABLET BY MOUTH DAILY. (TAKE WITH HCTZ 12.5 MG TABLET) 02/11/22   Abonza, Maritza, PA-C  Cholecalciferol (VITAMIN D3) 125 MCG (5000 UT) TABS 5,000 IU OTC vitamin D3 daily. 03/13/19   Opalski, Neoma Laming, DO  Cyanocobalamin (VITAMIN B12 PO) Take 1 tablet by mouth daily.    [provider]  hydrochlorothiazide (HYDRODIURIL) 12.5 MG tablet TAKE 1 TABLET BY MOUTH DAILY. (TAKE WITH AMLODIPINE-VALSARTAN) 02/11/22   Lorrene Reid, PA-C  meclizine (ANTIVERT) 12.5 MG tablet Take 1 tablet (12.5 mg total) by mouth 2 (two) times daily as needed for dizziness. 02/17/21   Ronnell Freshwater, NP  methylPREDNISolone (MEDROL) 4 MG TBPK tablet Take by mouth as directed for 6 days 04/16/21   Ronnell Freshwater, NP  metoprolol succinate (TOPROL-XL) 50 MG 24 hr tablet Take 1 tablet (50 mg total) by mouth daily. TAKE WITH OR IMMEDIATELY FOLLOWING A MEAL. 02/11/22   Lorrene Reid, PA-C  simvastatin (ZOCOR) 80 MG tablet Take 1 tablet (80 mg total) by mouth at bedtime. 02/11/22   Lorrene Reid, PA-C  sitaGLIPtin-metformin (JANUMET) 50-500 MG tablet Take 1 tablet by mouth 2 (two) times daily with a meal. 07/28/22   Boscia, Greer Ee, NP  tirzepatide [facility]) 5 MG/0.5ML Pen Inject 5 mg into the skin once a week. 08/10/22   Ronnell Freshwater, NP  triamcinolone ointment (KENALOG) 0.1 % Apply 1 application topically 2 (two) times daily. 04/16/21   Ronnell Freshwater, NP  valACYclovir (VALTREX) 1000 MG tablet Take 1 tablet (1,000 mg total) by mouth 2 (two) times daily. 04/16/21   Ronnell Freshwater, NP    Physical Exam: Vitals:   08/11/22 0500 08/11/22 0600 08/11/22 0700 08/11/22 0703  BP: 108/73 104/75 110/77   Pulse: (!) 47 (!) 46 (!) 48   Resp: 18 19 20   $ Temp:    98.2 F (36.8 C)  TempSrc:    Oral  SpO2: 95% 97% 93%     Constitutional: Older adult male who is currently lethargic but will not awaken and answer Eyes: PERRL, nystagmus appreciated when looking towards the right. ENMT: Mucous membranes are moist.   Neck: normal, supple, no masses, no thyromegaly Respiratory: clear to auscultation bilaterally, no wheezing,  no crackles. Normal respiratory effort. No accessory muscle use.  Cardiovascular: Regular rate and rhythm, no murmurs / rubs / gallops. No extremity edema. 2+ pedal pulses. No carotid bruits.  Abdomen: no tenderness, no masses palpated. No hepatosplenomegaly. Bowel sounds positive.  Musculoskeletal: no clubbing / cyanosis. No joint deformity upper and lower extremities. Good ROM, no contractures. Normal muscle tone.  Skin: no rashes, lesions, ulcers. No induration Neurologic: CN 2-12 grossly intact.  Able to move all extremities. Psychiatric: Normal judgment and insight. Alert and oriented x 3. Normal mood.   Data Reviewed:  EKG reveals sinus rhythm at 56 bpm with LAFB.  Reviewed labs, imaging, and pertinent records as noted above in the HPI.  Assessment and Plan:  Seizures Acute.  Patient presented with 2-week history of episodes of confusion with staring spells looking towards the right.  MRI and MRA of the brain did not note any acute abnormalities.  EEG suggested seizure-like activity coming from the left posterior quadrant patient was ordered Keppra 3 g IV -Admit to a medical telemetry bed -Seizure order set utilized -Seizure precautions -Continue continuous EEG monitoring -Continue Keppra 1000 mg twice daily    -Fosphenytoin -Normal saline IV fluids at 75 mL/h -Ativan IV as needed for seizure activity -Appreciate neurology consultative services, we will follow-up for any further recommendations  Leukocytosis  Acute.  WBC elevated at 11.  UA was noted to be positive. -Recheck CBC tomorrow morning  Urinary tract infection Acute.  Urinalysis was positive for nitrates with rare bacteria and 11-20 WBCs. -Check urine culture -Empiric antibiotics of Rocephin  Uncontrolled diabetes mellitus type 2 with hyperglycemia, without long-term use of insulin On admission initial glucose elevated at 407.  Hemoglobin A1c noted to be 13.1.  Home medication regimen just resumed included Janumet 50-500 mg twice daily and Mounjaro 5 mg weekly.. -Hypoglycemic protocols -Semglee 15 units daily -CBGs before every meal with moderate SSI -NovoLog 0 to 5 units nightly -Appreciate diabetic education we will follow-up for any further recommendations  Sinus bradycardia Heart rates noted to be 46-64 while in the ED with blood pressures maintained. -Hold metoprolol  Hypertension associated with diabetes Blood pressures currently maintained, but noted to be as low as 90/60. -Held metoprolol due to bradycardia. -Resume patient's other home medications when medically appropriate as noted fosphenytoin can cause hypotension  Hyperlipidemia associated with diabetes mellitus type 2 -Continue pharmacy substitution of atorvastatin  GI prophylaxis: Protonix DVT prophylaxis: Lovenox Advance Care Planning:   Code Status: Full Code    Consults: Neurology  Family Communication: Wife updated at bedside  Severity of Illness: The appropriate patient status for this patient is OBSERVATION. Observation status is judged to be reasonable and necessary in order to provide the required intensity of service to ensure the patient's safety. The patient's presenting symptoms, physical exam findings, and initial radiographic and  laboratory data in the context of their medical condition is felt to place them at decreased risk for further clinical deterioration. Furthermore, it is anticipated that the patient will be medically stable for discharge from the hospital within 2 midnights of admission.   Author: Norval Morton, MD 08/11/2022 7:39 AM  For on call review [email or web address].

## 2022-08-11 NOTE — ED Notes (Signed)
ED TO INPATIENT HANDOFF REPORT  ED Nurse Name and Phone #: [phone]  S Name/Age/Gender Russell Robles 62 y.o. male Room/Bed: 006C/006C  Code Status   Code Status: Full Code  Home/SNF/Other Home Patient oriented to: self, place, time, and situation intermittent secondary to having seizure Is this baseline? No   Triage Complete: Triage complete  Chief Complaint Episodes of staring [R40.4]  Triage Note Patient here for further evaluation of "vision continuously moving to the right" since one week ago. Patient seen at [facility] for same. Family also reports patient is sleeping more than normal this week.    Allergies No Known Allergies  Level of Care/Admitting Diagnosis ED Disposition     ED Disposition  Admit   Condition  --   Comment  Hospital Area: [place] [[id number]]  Level of Care: Telemetry Medical [104]  May place patient in observation at [facility] or [place] if equivalent level of care is available:: No  Covid Evaluation: Asymptomatic - no recent exposure (last 10 days) testing not required  Diagnosis: Episodes of staring [id number]:[id number]  Admitting Physician: HUESTON, MARGRAVE [id number]  Attending Physician: MAIKOL, HOLDREN [id number]          B Medical/Surgery History Past Medical History:  Diagnosis Date   Anxiety    Arthritis    Cough    PER PT NON-PRODUCTIVE , NO FEVER OR CONGESTION   Hernia, inguinal, right    Hyperlipidemia    Hypertension    Type 2 diabetes mellitus ([place])    last  HbA1c 7.2 on 05-09-2017 in epic   Past Surgical History:  Procedure Laterality Date   CARPAL TUNNEL RELEASE Left 08/02/2018   Procedure: CARPAL TUNNEL RELEASE;  Surgeon: Thornton Park, MD;  Location: [facility] ORS;  Service: Orthopedics;  Laterality: Left;   HERNIA REPAIR N/A    Phreesia 05/18/2020   INGUINAL HERNIA REPAIR Left 12-16-2010  dr Donne Hazel  [place] Right 06/01/2017   Procedure: OPEN RIGHT INGUINAL HERNIA  REPAIR WITH MESH;  Surgeon: Kinsinger, Arta Bruce, MD;  Location: [place];  Service: General;  Laterality: Right;  GENERAL AND TAP BLOCK   INSERTION OF MESH Right 06/01/2017   Procedure: INSERTION OF MESH;  Surgeon: Kinsinger, Arta Bruce, MD;  Location: [place];  Service: General;  Laterality: Right;  GENERAL AND TAP BLOCK     A IV Location/Drains/Wounds Patient Lines/Drains/Airways Status     Active Line/Drains/Airways     Name Placement date Placement time Site Days   Peripheral IV 08/10/22 18 G 1.16" Right Antecubital 08/10/22  2106  Antecubital  1   Peripheral IV 08/11/22 20 G Anterior;Right Wrist 08/11/22  1018  Wrist  less than 1            Intake/Output Last 24 hours No intake or output data in the 24 hours ending 08/11/22 1334  Labs/Imaging Results for orders placed or performed during the hospital encounter of 08/10/22 (from the past 48 hour(s))  CBG monitoring, ED     Status: Abnormal   Collection Time: 08/10/22  8:32 PM  Result Value Ref Range   Glucose-Capillary 459 (H) 70 - 99 mg/dL    Comment: Glucose reference range applies only to samples taken after fasting for at least 8 hours.  Urine rapid drug screen (hosp performed)not at [facility]     Status: None   Collection Time: 08/10/22  9:07 PM  Result Value Ref Range   Opiates NONE  DETECTED NONE DETECTED   Cocaine NONE DETECTED NONE DETECTED   Benzodiazepines NONE DETECTED NONE DETECTED   Amphetamines NONE DETECTED NONE DETECTED   Tetrahydrocannabinol NONE DETECTED NONE DETECTED   Barbiturates NONE DETECTED NONE DETECTED    Comment: (NOTE) DRUG SCREEN FOR MEDICAL PURPOSES ONLY.  IF CONFIRMATION IS NEEDED FOR ANY PURPOSE, NOTIFY LAB WITHIN 5 DAYS.  LOWEST DETECTABLE LIMITS FOR URINE DRUG SCREEN Drug Class                     Cutoff (ng/mL) Amphetamine and metabolites    1000 Barbiturate and metabolites    200 Benzodiazepine                 200 Opiates and metabolites         300 Cocaine and metabolites        300 THC                            50 Performed at [facility], [place] [address] [place], [place] [address]   Urinalysis, Routine w reflex microscopic -Urine, Clean Catch     Status: Abnormal   Collection Time: 08/10/22  9:07 PM  Result Value Ref Range   Color, Urine YELLOW YELLOW   APPearance CLEAR CLEAR   Specific Gravity, Urine 1.028 1.005 - 1.030   pH 5.0 5.0 - 8.0   Glucose, UA >=500 (A) NEGATIVE mg/dL   Hgb urine dipstick NEGATIVE NEGATIVE   Bilirubin Urine NEGATIVE NEGATIVE   Ketones, ur 5 (A) NEGATIVE mg/dL   Protein, ur NEGATIVE NEGATIVE mg/dL   Nitrite POSITIVE (A) NEGATIVE   Leukocytes,Ua NEGATIVE NEGATIVE   RBC / HPF 0-5 0 - 5 RBC/hpf   WBC, UA 11-20 0 - 5 WBC/hpf   Bacteria, UA RARE (A) NONE SEEN   Squamous Epithelial / HPF 0-5 0 - 5 /HPF   Mucus PRESENT     Comment: Performed at [facility], [address], [place], [place] [address]  Ethanol     Status: None   Collection Time: 08/10/22  9:08 PM  Result Value Ref Range   Alcohol, Ethyl (B) <10 <10 mg/dL    Comment: (NOTE) Lowest detectable limit for serum alcohol is 10 mg/dL.  For medical purposes only. Performed at [facility], [place] [address] [place], [place] [address]   Hemoglobin A1c     Status: Abnormal   Collection Time: 08/10/22  9:08 PM  Result Value Ref Range   Hgb A1c MFr Bld 13.1 (H) 4.8 - 5.6 %    Comment: (NOTE) Pre diabetes:          5.7%-6.4%  Diabetes:              >6.4%  Glycemic control for   <7.0% adults with diabetes    Mean Plasma Glucose 329.27 mg/dL    Comment: Performed at [facility] [address] [place], [place] [address]  CBC with Differential     Status: Abnormal   Collection Time: 08/10/22  9:08 PM  Result Value Ref Range   WBC 11.0 (H) 4.0 - 10.5 K/uL   RBC 5.47 4.22 - 5.81 MIL/uL   Hemoglobin 16.1 13.0 - 17.0 g/dL   HCT 46.1 39.0 - 52.0 %   MCV 84.3 80.0 - 100.0 fL   MCH 29.4 26.0 - 34.0 pg    MCHC 34.9 30.0 - 36.0 g/dL  RDW 11.5 11.5 - 15.5 %   Platelets 283 150 - 400 K/uL   nRBC 0.0 0.0 - 0.2 %   Neutrophils Relative % 49 %   Neutro Abs 5.3 1.7 - 7.7 K/uL   Lymphocytes Relative 39 %   Lymphs Abs 4.3 (H) 0.7 - 4.0 K/uL   Monocytes Relative 8 %   Monocytes Absolute 0.9 0.1 - 1.0 K/uL   Eosinophils Relative 3 %   Eosinophils Absolute 0.4 0.0 - 0.5 K/uL   Basophils Relative 1 %   Basophils Absolute 0.1 0.0 - 0.1 K/uL   Immature Granulocytes 0 %   Abs Immature Granulocytes 0.03 0.00 - 0.07 K/uL    Comment: Performed at [facility] [address] [place], [place] [address]  Comprehensive metabolic panel     Status: Abnormal   Collection Time: 08/10/22  9:08 PM  Result Value Ref Range   Sodium 134 (L) 135 - 145 mmol/L   Potassium 3.8 3.5 - 5.1 mmol/L   Chloride 98 98 - 111 mmol/L   CO2 25 22 - 32 mmol/L   Glucose, Bld 407 (H) 70 - 99 mg/dL    Comment: Glucose reference range applies only to samples taken after fasting for at least 8 hours.   BUN 18 8 - 23 mg/dL   Creatinine, Ser 1.17 0.61 - 1.24 mg/dL   Calcium 10.0 8.9 - 10.3 mg/dL   Total Protein 7.6 6.5 - 8.1 g/dL   Albumin 4.2 3.5 - 5.0 g/dL   AST 26 15 - 41 U/L   ALT 32 0 - 44 U/L   Alkaline Phosphatase 73 38 - 126 U/L   Total Bilirubin 0.8 0.3 - 1.2 mg/dL   GFR, Estimated >60 >60 mL/min    Comment: (NOTE) Calculated using the CKD-EPI Creatinine Equation (2021)    Anion gap 11 5 - 15    Comment: Performed at [facility] [address] [place], [place] [address]  I-stat chem 8, ED (not at [facility], [facility] or [facility])     Status: Abnormal   Collection Time: 08/10/22  9:35 PM  Result Value Ref Range   Sodium 134 (L) 135 - 145 mmol/L   Potassium 3.6 3.5 - 5.1 mmol/L   Chloride 97 (L) 98 - 111 mmol/L   BUN 21 8 - 23 mg/dL   Creatinine, Ser 1.00 0.61 - 1.24 mg/dL   Glucose, Bld 390 (H) 70 - 99 mg/dL    Comment: Glucose reference range applies only to samples taken after fasting for at least 8 hours.    Calcium, Ion 1.16 1.15 - 1.40 mmol/L   TCO2 26 22 - 32 mmol/L   Hemoglobin 16.7 13.0 - 17.0 g/dL   HCT 49.0 39.0 - 52.0 %  CBG monitoring, ED     Status: Abnormal   Collection Time: 08/10/22 11:50 PM  Result Value Ref Range   Glucose-Capillary 332 (H) 70 - 99 mg/dL    Comment: Glucose reference range applies only to samples taken after fasting for at least 8 hours.  CBG monitoring, ED     Status: Abnormal   Collection Time: 08/11/22  8:15 AM  Result Value Ref Range   Glucose-Capillary 277 (H) 70 - 99 mg/dL    Comment: Glucose reference range applies only to samples taken after fasting for at least 8 hours.  HIV Antibody (routine testing w rflx)     Status: None   Collection Time: 08/11/22 10:37 AM  Result Value Ref Range  HIV Screen 4th Generation wRfx Non Reactive Non Reactive    Comment: Performed at  Hospital Lab, [place] [address], [place], [place] [address]  CBG monitoring, ED     Status: Abnormal   Collection Time: 08/11/22 12:16 PM  Result Value Ref Range   Glucose-Capillary 343 (H) 70 - 99 mg/dL    Comment: Glucose reference range applies only to samples taken after fasting for at least 8 hours.   Comment 1 Notify RN    Comment 2 Document in Chart    MR BRAIN WO CONTRAST  Result Date: 08/10/2022 CLINICAL DATA:  Blurry vision EXAM: MRI HEAD WITHOUT CONTRAST MRA HEAD WITHOUT CONTRAST TECHNIQUE: Multiplanar, multi-echo pulse sequences of the brain and surrounding structures were acquired without intravenous contrast. Angiographic images of the Circle of Willis were acquired using MRA technique without intravenous contrast. COMPARISON:  None Available. FINDINGS: MRI HEAD FINDINGS Only diffusion-weighted imaging was able to be performed. There is no acute infarct or extra-axial collection. MRA HEAD FINDINGS POSTERIOR CIRCULATION: --Vertebral arteries: Normal --Inferior cerebellar arteries: Normal. --Basilar artery: Normal. --Superior cerebellar arteries: Normal. --Posterior  cerebral arteries: Normal. ANTERIOR CIRCULATION: --Intracranial internal carotid arteries: Normal. --Anterior cerebral arteries (ACA): Normal. --Middle cerebral arteries (MCA): Normal. IMPRESSION: 1. No acute infarct. Normal diffusion-weighted imaging of the brain. 2. Normal intracranial MRA. Electronically Signed   By: Ulyses Jarred M.D.   On: 08/10/2022 22:39   MR ANGIO HEAD WO CONTRAST  Result Date: 08/10/2022 CLINICAL DATA:  Blurry vision EXAM: MRI HEAD WITHOUT CONTRAST MRA HEAD WITHOUT CONTRAST TECHNIQUE: Multiplanar, multi-echo pulse sequences of the brain and surrounding structures were acquired without intravenous contrast. Angiographic images of the Circle of Willis were acquired using MRA technique without intravenous contrast. COMPARISON:  None Available. FINDINGS: MRI HEAD FINDINGS Only diffusion-weighted imaging was able to be performed. There is no acute infarct or extra-axial collection. MRA HEAD FINDINGS POSTERIOR CIRCULATION: --Vertebral arteries: Normal --Inferior cerebellar arteries: Normal. --Basilar artery: Normal. --Superior cerebellar arteries: Normal. --Posterior cerebral arteries: Normal. ANTERIOR CIRCULATION: --Intracranial internal carotid arteries: Normal. --Anterior cerebral arteries (ACA): Normal. --Middle cerebral arteries (MCA): Normal. IMPRESSION: 1. No acute infarct. Normal diffusion-weighted imaging of the brain. 2. Normal intracranial MRA. Electronically Signed   By: Ulyses Jarred M.D.   On: 08/10/2022 22:39    Pending Labs Unresulted Labs (From admission, onward)     Start     Ordered   08/11/22 1328  Magnesium  Once,   R        08/11/22 1327   08/11/22 1328  CK  Once,   R        08/11/22 1327            Vitals/Pain Today's Vitals   08/11/22 1130 08/11/22 1200 08/11/22 1230 08/11/22 1300  BP: 108/67 99/67 112/65 109/60  Pulse: (!) 55 (!) 51 (!) 48 (!) 50  Resp: 18 16 18 18  $ Temp:      TempSrc:      SpO2: 92% 98% 95% 98%  PainSc:        Isolation  Precautions No active isolations  Medications Medications  insulin glargine-yfgn (SEMGLEE) injection 15 Units (15 Units Subcutaneous Given 08/11/22 1140)  insulin aspart (novoLOG) injection 0-15 Units (8 Units Subcutaneous Given 08/11/22 0822)  insulin aspart (novoLOG) injection 0-5 Units (has no administration in time range)  Oral care mouth rinse (15 mLs Mouth Rinse Given 08/11/22 1301)  Oral care mouth rinse (has no administration in time range)  0.9 %  sodium chloride infusion (  75 mL/hr Intravenous New Bag/Given 08/11/22 1139)  enoxaparin (LOVENOX) injection 40 mg (40 mg Subcutaneous Given 08/11/22 1021)  ondansetron (ZOFRAN) tablet 4 mg (has no administration in time range)    Or  ondansetron (ZOFRAN) injection 4 mg (has no administration in time range)  albuterol (PROVENTIL) (2.5 MG/3ML) 0.083% nebulizer solution 2.5 mg (has no administration in time range)  LORazepam (ATIVAN) injection 1-2 mg (has no administration in time range)  pantoprazole (PROTONIX) injection 40 mg (40 mg Intravenous Given 08/11/22 1021)  fosPHENYtoin (CEREBYX) 911 mg PE in sodium chloride 0.9 % 50 mL IVPB (has no administration in time range)  levETIRAcetam (KEPPRA) IVPB 1000 mg/100 mL premix (has no administration in time range)  sodium chloride 0.9 % bolus 1,000 mL (0 mLs Intravenous Stopped 08/10/22 2327)  insulin aspart (novoLOG) injection 5 Units (5 Units Subcutaneous Given 08/11/22 0009)  LORazepam (ATIVAN) injection 2 mg (2 mg Intravenous Given 08/11/22 1018)  levETIRAcetam (KEPPRA) IVPB 1000 mg/100 mL premix (0 mg Intravenous Stopped 08/11/22 1022)    And  levETIRAcetam (KEPPRA) IVPB 1000 mg/100 mL premix (0 mg Intravenous Stopped 08/11/22 1022)    And  levETIRAcetam (KEPPRA) IVPB 1000 mg/100 mL premix (0 mg Intravenous Stopped 08/11/22 1022)    Mobility I have not ambulated moves freely in the bed      Focused Assessments Neuro Assessment Handoff:  Swallow screen pass? Yes  Cardiac Rhythm: Sinus  bradycardia NIH Stroke Scale  Dizziness Present: No Headache Present: No Interval: Other (Comment) Level of Consciousness (1a.)   : Alert, keenly responsive LOC Questions (1b. )   : Answers both questions correctly LOC Commands (1c. )   : Performs both tasks correctly Best Gaze (2. )  : Normal Visual (3. )  : No visual loss Facial Palsy (4. )    : Normal symmetrical movements Motor Arm, Left (5a. )   : No drift Motor Arm, Right (5b. ) : No drift Motor Leg, Left (6a. )  : No drift Motor Leg, Right (6b. ) : No drift Limb Ataxia (7. ): Absent Sensory (8. )  : Normal, no sensory loss Best Language (9. )  : No aphasia Dysarthria (10. ): Normal Extinction/Inattention (11.)   : No Abnormality Complete NIHSS TOTAL: 0     Neuro Assessment: Exceptions to WDL Neuro Checks:   Initial (08/10/22 2116)  Has TPA been given? No If patient is a Neuro Trauma and patient is going to OR before floor call report to [place] nurse: [phone] or [phone]   R Recommendations: See Admitting Provider Note  Report given to:   Additional Notes:

## 2022-08-11 NOTE — ED Notes (Signed)
Neurologist at bedside. 

## 2022-08-12 ENCOUNTER — Other Ambulatory Visit: Payer: Self-pay

## 2022-08-12 ENCOUNTER — Telehealth (HOSPITAL_COMMUNITY): Payer: Self-pay | Admitting: Pharmacy Technician

## 2022-08-12 ENCOUNTER — Other Ambulatory Visit (HOSPITAL_COMMUNITY): Payer: Self-pay

## 2022-08-12 ENCOUNTER — Encounter (HOSPITAL_COMMUNITY): Payer: Self-pay | Admitting: Internal Medicine

## 2022-08-12 DIAGNOSIS — R569 Unspecified convulsions: Secondary | ICD-10-CM | POA: Diagnosis not present

## 2022-08-12 LAB — CBC
HCT: 38.1 % — ABNORMAL LOW (ref 39.0–52.0)
Hemoglobin: 13.4 g/dL (ref 13.0–17.0)
MCH: 29.5 pg (ref 26.0–34.0)
MCHC: 35.2 g/dL (ref 30.0–36.0)
MCV: 83.9 fL (ref 80.0–100.0)
Platelets: 203 10*3/uL (ref 150–400)
RBC: 4.54 MIL/uL (ref 4.22–5.81)
RDW: 11.9 % (ref 11.5–15.5)
WBC: 8.6 10*3/uL (ref 4.0–10.5)
nRBC: 0 % (ref 0.0–0.2)

## 2022-08-12 LAB — GLUCOSE, CAPILLARY
Glucose-Capillary: 243 mg/dL — ABNORMAL HIGH (ref 70–99)
Glucose-Capillary: 249 mg/dL — ABNORMAL HIGH (ref 70–99)
Glucose-Capillary: 204 mg/dL — ABNORMAL HIGH (ref 70–99)
Glucose-Capillary: 297 mg/dL — ABNORMAL HIGH (ref 70–99)

## 2022-08-12 MED ORDER — PERAMPANEL 2 MG PO TABS
2.0000 mg | ORAL_TABLET | Freq: Once | ORAL | Status: AC
  Administered 2022-08-12: 2 mg via ORAL
  Filled 2022-08-12: qty 1

## 2022-08-12 MED ORDER — ORAL CARE MOUTH RINSE: 15.0000 mL | OROMUCOSAL | Status: DC | PRN

## 2022-08-12 MED ORDER — CLONAZEPAM 1 MG PO TABS
1.0000 mg | ORAL_TABLET | Freq: Two times a day (BID) | ORAL | Status: DC
  Administered 2022-08-12: 1 mg via ORAL
  Filled 2022-08-12: qty 1

## 2022-08-12 MED ORDER — SODIUM CHLORIDE 0.9 % IV SOLN
2000.0000 mg | Freq: Two times a day (BID) | INTRAVENOUS | Status: DC
  Administered 2022-08-12 – 2022-08-13 (×3): 2000 mg via INTRAVENOUS
  Filled 2022-08-12 (×6): qty 20

## 2022-08-12 MED ORDER — INSULIN GLARGINE-YFGN 100 UNIT/ML ~~LOC~~ SOLN
20.0000 [IU] | Freq: Every day | SUBCUTANEOUS | Status: DC
  Administered 2022-08-12 – 2022-08-13 (×2): 20 [IU] via SUBCUTANEOUS
  Filled 2022-08-12 (×2): qty 0.2

## 2022-08-12 MED ORDER — LEVETIRACETAM IN NACL 1500 MG/100ML IV SOLN
1500.0000 mg | Freq: Two times a day (BID) | INTRAVENOUS | Status: DC
  Administered 2022-08-12: 1500 mg via INTRAVENOUS
  Filled 2022-08-12 (×2): qty 100

## 2022-08-12 MED ORDER — CLONAZEPAM 1 MG PO TABS
1.0000 mg | ORAL_TABLET | Freq: Three times a day (TID) | ORAL | Status: DC
  Administered 2022-08-12 – 2022-08-17 (×14): 1 mg via ORAL
  Filled 2022-08-12 (×14): qty 1

## 2022-08-12 NOTE — Progress Notes (Signed)
Subjective: States he knows when he is having a seizure.  Describes things moving towards the right side and then unable to talk.  ROS: negative except above  Examination  Vital signs in last 24 hours: Temp:  [97.5 F (36.4 C)-98.3 F (36.8 C)] 97.9 F (36.6 C) (02/23 0840) Pulse Rate:  [48-70] 60 (02/23 0840) Resp:  [16-25] 18 (02/23 0840) BP: (90-162)/(59-144) 116/66 (02/23 0840) SpO2:  [92 %-98 %] 96 % (02/23 0840)  General: lying in bed, NAD Neuro: MS: Alert, oriented, follows commands  CN: pupils equal and reactive,  EOMI, face symmetric, tongue midline, normal sensation over face, Motor: 5/5 strength in all 4 extremities  Basic Metabolic Panel: Recent Labs  Lab 08/10/22 2108 08/10/22 2135 08/11/22 1420  NA 134* 134* 134*  K 3.8 3.6 3.8  CL 98 97* 102  CO2 25  --  23  GLUCOSE 407* 390* 346*  BUN '18 21 12  '$ CREATININE 1.17 1.00 1.06  CALCIUM 10.0  --  8.8*  MG  --   --  1.9    CBC: Recent Labs  Lab 08/10/22 2108 08/10/22 2135 08/12/22 0749  WBC 11.0*  --  8.6  NEUTROABS 5.3  --   --   HGB 16.1 16.7 13.4  HCT 46.1 49.0 38.1*  MCV 84.3  --  83.9  PLT 283  --  203     Coagulation Studies: No results for input(s): "LABPROT", "INR" in the last 72 hours.  Imaging No new brain imaging overnight  ASSESSMENT AND PLAN: 62 year old male with intermittent confusion in the setting of blood glucose over 500.  EEG showed intermittent seizures arising from left posterior quadrant.   Provoked seizures in the setting of hyperglycemia -Was loaded with perampanel 8 mg last night and started on Vimpat 100 mg twice daily.  EEG continues to show average 3 seizures per hour lasting about 2 to 4 minutes each arising from left parieto-occipital region.  Therefore starting clonazepam 1 mg twice daily. -Increase Keppra to 1500 mg twice daily -If any further seizures, can increase to 2000 mg twice daily and increase Klonopin to 1 mg 3 times daily -Phenytoin did not help much  with seizures.  Therefore was discontinued.  If frequent seizures, can load with Depakote next -Defer optimization of blood glucose to medicine team -Continue seizure precautions -As needed IV Versed for clinical seizures lasting more than 5 minutes -Will likely need repeat MRI brain with Ativan as the previous MRI brain was not complete -Discussed plan in detail with patient and husband at bedside, Dr. Starla Link  via secure chat   I have spent a total of  39  minutes with the patient reviewing hospital notes,  test results, labs and examining the patient as well as establishing an assessment and plan that was discussed personally with the patient.  > 50% of time was spent in direct patient care.    Zeb Comfort Epilepsy [facility] For questions after 5pm please refer to AMION to reach the Neurologist on call

## 2022-08-12 NOTE — Progress Notes (Signed)
Inpatient Diabetes Program Recommendations  AACE/ADA: New Consensus Statement on Inpatient Glycemic Control (2015)  Target Ranges:  Prepandial:   less than 140 mg/dL      Peak postprandial:   less than 180 mg/dL (1-2 hours)      Critically ill patients:  140 - 180 mg/dL   Lab Results  Component Value Date   GLUCAP 249 (H) 08/12/2022   HGBA1C 13.1 (H) 08/10/2022    Review of Glycemic Control  Latest Reference Range & Units 08/11/22 12:16 08/11/22 16:13 08/11/22 21:31 08/12/22 06:01 08/12/22 11:24  Glucose-Capillary 70 - 99 mg/dL 343 (H) 286 (H) 133 (H) 243 (H) 249 (H)  Diabetes history: DM 2 Outpatient Diabetes medications:  Janumet 50-500 mg bid Mounjaro 5 mg weekly (08/10/22-started) Current orders for Inpatient glycemic control:  Novolog 0-15 tid with meals and HS Semglee 20 units daily Inpatient Diabetes Program Recommendations:    Spoke to patient and wife at length regarding DM management.  Patient much more alert today.    MD ordered application of Dexcom [id number] for patient.  Education done regarding application and changing CGM sensor (alternate every 10 days on back of arms), 30 minute warm-up, use of glucometer when alert displays, how to place CGM G7 app on phone and information for PCP.  Sensor applied by patient to left arm at 2:45 PM.  Explained that glucose readings will not be available until 30 minutes after application. Reviewed use of CGM including how to obtain glucose readings, changing Sensor, arrows with glucose readings, and Dexcom App.  We also discussed basal versus bolus insulin and how insulin is to be given.  They had great questions.  We also reviewed and demonstrated insulin pen use.  They are familiar with pens.  Demonstrated use and they verbalized understanding.   We also discussed medications for DM and the ones that are on formulary.  Trulicity (0000000) is covered (with Preauthorization) for 35$.  Basaglar [phone])  is preferred basal insulin for  35$.  Dexcom G7 [phone]) is covered as well for 35$.  Will need close f/u with MD for titration up of Trulicity.   Patient also has Janumet and 3 doses of Mounjaro from sample.  They are asking if he should take these as well.  Will ask MD.    Thanks,  Adah Perl, RN, BC-ADM Inpatient Diabetes Coordinator Pager [phone]  (8a-5p)

## 2022-08-12 NOTE — Progress Notes (Signed)
Inpatient Rehab Admissions Coordinator :  Per therapy recommendations patient was screened for CIR candidacy by Danne Baxter RN MSN. Patient may not demonstrate the medical neccesity for a [place] /CIR admit. I will place OT consult also for assessment. We await further medical workup and therapy progress. I will not place a Rehab Consult. Please contact me with any questions.  Danne Baxter RN MSN Admissions Coordinator [phone]

## 2022-08-12 NOTE — Telephone Encounter (Signed)
Patient Advocate Encounter  Prior Authorization for Trulicity 0.'75MG'$ /0.5ML pen-injectors has been approved.    PA# [id number] Key: [id number] Effective dates: 08/12/2022 through 08/13/2023  Patients co-pay is $35.00.     Lyndel Safe, [place] Patient Advocate Specialist [place] Patient Advocate Team Direct Number: [phone]  Fax: [phone]

## 2022-08-12 NOTE — Progress Notes (Signed)
  Transition of Care [facility]) Screening Note   Patient Details  Name: Russell Robles Date of Birth: 03-Russell-1962   Transition of Care [facility]) CM/SW Contact:    Pollie Friar, RN Phone Number: 08/12/2022, 4:06 PM   Pt is from home with spouse. CIR to evaluate for rehab admission. Transition of Care Department [facility]) has reviewed patient. We will continue to monitor patient advancement through interdisciplinary progression rounds. If new patient transition needs arise, please place a TOC consult.

## 2022-08-12 NOTE — Plan of Care (Signed)

## 2022-08-12 NOTE — Progress Notes (Signed)
Performed maintenance on leads.  All under 10.  No visible skin breakdown

## 2022-08-12 NOTE — TOC Benefit Eligibility Note (Signed)
Patient Teacher, English as a foreign language completed.    The patient is currently admitted and upon discharge could be taking lacosamide (Vimpat) 100 mg tablets.  The current 30 day co-pay is $10.00.   The patient is insured through [place], [place] Patient Advocate Specialist [place] Patient Advocate Team Direct Number: [phone]  Fax: [phone]

## 2022-08-12 NOTE — Progress Notes (Signed)
PROGRESS NOTE    Russell Robles  [id number] DOB: 06-07-1961 DOA: 08/10/2022 PCP: Ronnell Freshwater, NP   Brief Narrative:  62 y.o. male with medical history significant of hypertension, hyperlipidemia, diabetes mellitus type 2, anxiety presented with worsening confusion and garbled speech getting worse over the last 2 weeks.  He had a CT of the head without contrast done on 08/04/2022 in the ED which showed no acute abnormality and a chronic left basal ganglier lacunar infarct and he was referred to outpatient neurology.  His symptoms worsened so he presented to the ED.  On presentation, MRI of the brain and MRI of head without contrast did not show any acute abnormalities.  Neurology was consulted.  EEG showed seizures.  He was started on Keppra and Vimpat.    Assessment & Plan:   New onset seizures -  MRI and MRA of the brain did not note any acute abnormalities.   EEG showed seizures.  Currently undergoing LTM EEG.  Neurology following.  Currently on Keppra, Klonopin and Vimpat. -Seizure/fall precautions.  Possible UTI -Present on admission.  Continue Rocephin.  Follow cultures  Leukocytosis -Resolved  Diabetes mellitus type 2 with hyperglycemia -A1c 13.1.  Increase Lantus to 20 units daily.  Continue CBGs with SSI.  Will need long-acting insulin on discharge. -Home regimen of Janumet and Mounjaro on hold for now. -Diabetes coordinator following  Hypertension -Blood pressure on the lower side.  Metoprolol on hold due to bradycardia as well  Hyperlipidemia -Continue statin  Obesity -Outpatient follow-up   DVT prophylaxis: Lovenox Code Status: Full Family Communication: Wife at bedside Disposition Plan: Status is: Inpatient Remains inpatient appropriate because: Of severity of illness  Consultants: Neurology  Procedures: EEG/LTM EEG  Antimicrobials: None   Subjective: Patient seen and examined at bedside.  Slow to respond but answers questions  appropriately.  No fever, vomiting, agitation reported.  Objective: Vitals:   08/11/22 2117 08/11/22 2336 08/12/22 0300 08/12/22 0840  BP: 103/69 106/70 110/71 116/66  Pulse: (!) 52 (!) 51 70 60  Resp: '18 18 18 18  '$ Temp: 97.8 F (36.6 C) 97.6 F (36.4 C) 98.2 F (36.8 C) 97.9 F (36.6 C)  TempSrc: Oral Oral Oral Oral  SpO2: 95% 93% 98% 96%    Intake/Output Summary (Last 24 hours) at 08/12/2022 1132 Last data filed at 08/12/2022 [id number] Gross per 24 hour  Intake 1429.74 ml  Output 1225 ml  Net 204.74 ml   There were no vitals filed for this visit.  Examination:  General exam: Appears calm and comfortable.  On room air.  No distress.  Underwent negative EEG Respiratory system: Bilateral decreased breath sounds at bases Cardiovascular system: S1 & S2 heard, mild intermittent bradycardia present gastrointestinal system: Abdomen is obese, nondistended, soft and nontender. Normal bowel sounds heard. Extremities: No cyanosis, clubbing, edema  Central nervous system: Awake, slow to respond.  Answers some questions appropriately.  No focal neurological deficits. Moving extremities.  No abnormal body movements. Skin: No rashes, lesions or ulcers Psychiatry: Flat affect.  Not agitated.   Data Reviewed: I have personally reviewed following labs and imaging studies  CBC: Recent Labs  Lab 08/10/22 2108 08/10/22 2135 08/12/22 0749  WBC 11.0*  --  8.6  NEUTROABS 5.3  --   --   HGB 16.1 16.7 13.4  HCT 46.1 49.0 38.1*  MCV 84.3  --  83.9  PLT 283  --  123456   Basic Metabolic Panel: Recent Labs  Lab 08/10/22 2108 08/10/22 2135 08/11/22  1420  NA 134* 134* 134*  K 3.8 3.6 3.8  CL 98 97* 102  CO2 25  --  23  GLUCOSE 407* 390* 346*  BUN '18 21 12  '$ CREATININE 1.17 1.00 1.06  CALCIUM 10.0  --  8.8*  MG  --   --  1.9   GFR: Estimated Creatinine Clearance: 80.2 mL/min (by C-G formula based on SCr of 1.06 mg/dL). Liver Function Tests: Recent Labs  Lab 08/10/22 2108  AST 26  ALT  32  ALKPHOS 73  BILITOT 0.8  PROT 7.6  ALBUMIN 4.2   No results for input(s): "LIPASE", "AMYLASE" in the last 168 hours. No results for input(s): "AMMONIA" in the last 168 hours. Coagulation Profile: No results for input(s): "INR", "PROTIME" in the last 168 hours. Cardiac Enzymes: Recent Labs  Lab 08/11/22 1420  CKTOTAL 38*   BNP (last 3 results) No results for input(s): "PROBNP" in the last 8760 hours. HbA1C: Recent Labs    08/10/22 2108  HGBA1C 13.1*   CBG: Recent Labs  Lab 08/11/22 1216 08/11/22 1613 08/11/22 2131 08/12/22 0601 08/12/22 1124  GLUCAP 343* 286* 133* 243* 249*   Lipid Profile: No results for input(s): "CHOL", "HDL", "LDLCALC", "TRIG", "CHOLHDL", "LDLDIRECT" in the last 72 hours. Thyroid Function Tests: No results for input(s): "TSH", "T4TOTAL", "FREET4", "T3FREE", "THYROIDAB" in the last 72 hours. Anemia Panel: No results for input(s): "VITAMINB12", "FOLATE", "FERRITIN", "TIBC", "IRON", "RETICCTPCT" in the last 72 hours. Sepsis Labs: No results for input(s): "PROCALCITON", "LATICACIDVEN" in the last 168 hours.  No results found for this or any previous visit (from the past 240 hour(s)).       Radiology Studies: Overnight EEG with video  Result Date: 08/11/2022 Lora Havens, MD     08/12/2022  9:05 AM Patient Name: Russell Robles MRN: [id number] Epilepsy Attending: Lora Havens Referring Physician/Provider: Kerney Elbe, MD Duration: 08/11/2022 0701 to 08/12/2022 0701 Patient history: 62 year old male who has been having altered mentation and episodes of staring off his face for less than 1 second. EEG to evaluate for seizure. Level of alertness: Awake, asleep AEDs during EEG study: LEV, LCM, PHT, Perampanel Technical aspects: This EEG study was done with scalp electrodes positioned according to the 10-20 International system of electrode placement. Electrical activity was reviewed with band pass filter of 1-'70Hz'$ , sensitivity of 7 uV/mm,  display speed of 74m/sec with a '60Hz'$  notched filter applied as appropriate. EEG data were recorded continuously and digitally stored.  Video monitoring was available and reviewed as appropriate. Description: The posterior dominant rhythm consists of 8 Hz activity of moderate voltage (25-35 uV) seen predominantly in posterior head regions, symmetric and reactive to eye opening and eye closing. Sleep was characterized by vertex waves, sleep spindles (12 to 14 Hz), maximal frontocentral region.  Seizures were noted arising from left parieto-occipital region.  During seizure, patient was noted to be staring off, unable to respond or follow commands, at times with right upper extremity stiffness.  At the onset of seizure, EEG showed low amplitude 15 to 18 Hz beta activity in the right occipital region which then involved all of left hemisphere followed by right hemisphere.  EEG then evolved into sharply contoured 3 to 5 Hz theta-delta slowing.  Average 3 seizures were noted per hour, lasting about 2-4 minutes each. Hyperventilation and photic stimulation were not performed.   ABNORMALITY -Focal seizures with impaired awareness, left parieto-occipital region IMPRESSION: This study showed focal seizures arising from parieto-occipital region, average 3 seizures per  hour, lasting about 2 to 4 minutes each. During seizure, patient was noted to be staring off, unable to respond or follow commands, at times with right upper extremity stiffness. Lora Havens   MR BRAIN WO CONTRAST  Result Date: 08/10/2022 CLINICAL DATA:  Blurry vision EXAM: MRI HEAD WITHOUT CONTRAST MRA HEAD WITHOUT CONTRAST TECHNIQUE: Multiplanar, multi-echo pulse sequences of the brain and surrounding structures were acquired without intravenous contrast. Angiographic images of the Circle of Willis were acquired using MRA technique without intravenous contrast. COMPARISON:  None Available. FINDINGS: MRI HEAD FINDINGS Only diffusion-weighted imaging  was able to be performed. There is no acute infarct or extra-axial collection. MRA HEAD FINDINGS POSTERIOR CIRCULATION: --Vertebral arteries: Normal --Inferior cerebellar arteries: Normal. --Basilar artery: Normal. --Superior cerebellar arteries: Normal. --Posterior cerebral arteries: Normal. ANTERIOR CIRCULATION: --Intracranial internal carotid arteries: Normal. --Anterior cerebral arteries (ACA): Normal. --Middle cerebral arteries (MCA): Normal. IMPRESSION: 1. No acute infarct. Normal diffusion-weighted imaging of the brain. 2. Normal intracranial MRA. Electronically Signed   By: Ulyses Jarred M.D.   On: 08/10/2022 22:39   MR ANGIO HEAD WO CONTRAST  Result Date: 08/10/2022 CLINICAL DATA:  Blurry vision EXAM: MRI HEAD WITHOUT CONTRAST MRA HEAD WITHOUT CONTRAST TECHNIQUE: Multiplanar, multi-echo pulse sequences of the brain and surrounding structures were acquired without intravenous contrast. Angiographic images of the Circle of Willis were acquired using MRA technique without intravenous contrast. COMPARISON:  None Available. FINDINGS: MRI HEAD FINDINGS Only diffusion-weighted imaging was able to be performed. There is no acute infarct or extra-axial collection. MRA HEAD FINDINGS POSTERIOR CIRCULATION: --Vertebral arteries: Normal --Inferior cerebellar arteries: Normal. --Basilar artery: Normal. --Superior cerebellar arteries: Normal. --Posterior cerebral arteries: Normal. ANTERIOR CIRCULATION: --Intracranial internal carotid arteries: Normal. --Anterior cerebral arteries (ACA): Normal. --Middle cerebral arteries (MCA): Normal. IMPRESSION: 1. No acute infarct. Normal diffusion-weighted imaging of the brain. 2. Normal intracranial MRA. Electronically Signed   By: Ulyses Jarred M.D.   On: 08/10/2022 22:39        Scheduled Meds:  atorvastatin  40 mg Oral Daily   clonazePAM  1 mg Oral BID   enoxaparin (LOVENOX) injection  40 mg Subcutaneous Q24H   insulin aspart  0-15 Units Subcutaneous TID WC   insulin  aspart  0-5 Units Subcutaneous QHS   insulin glargine-yfgn  20 Units Subcutaneous Daily   pantoprazole (PROTONIX) IV  40 mg Intravenous Q24H   Continuous Infusions:  sodium chloride 75 mL/hr (08/12/22 0455)   cefTRIAXone (ROCEPHIN)  IV 200 mL/hr at 08/12/22 0455   lacosamide (VIMPAT) IV 100 mg (08/12/22 0959)   levETIRAcetam 1,500 mg (08/12/22 1032)          Aline August, MD [facility] 08/12/2022, 11:32 AM

## 2022-08-12 NOTE — Progress Notes (Addendum)
LTM maint complete - no skin breakdown .  Serviced multiple leads [facility] monitored, Event button test confirmed by [facility].  0637 re-serviced leads.

## 2022-08-12 NOTE — Evaluation (Addendum)
Physical Therapy Evaluation Patient Details Name: Russell Robles MRN: [id number] DOB: 1961-05-16 Today's Date: 08/12/2022  History of Present Illness  62 y.o. male admitted 2/21 after presenting with garbled speech and confusion. New onset seizures, possible UTI. PMHx: hypertension, hyperlipidemia, diabetes mellitus type 2, anxiety  Clinical Impression  Pt admitted with above diagnosis. 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 During evaluation noted to have dysmetric FNF with RUE. SILT throughout. Strength and RAM WNL BIL UEs and LEs. Patient with unsteadiness upon standing requiring min assist with transfer and short distance gait in room showing loss of balance anteriorly, posteriorly, and laterally with somewhat ataxic appearance. Pt reports near fall earlier today when OOB. Distance and further assessment limited due to connection to EEG in room. Pt aware of abnormal balance. Would like to attempt BERG next visit, DGI on hold due to EEG connection. Wife works, was present and supportive during evaluation. I anticipate great recovery with adequate therapies at AIR level to return pt to an active independent level before returning home. Pt currently with functional limitations due to the deficits listed below (see PT Problem List). Pt will benefit from skilled PT to increase their independence and safety with mobility to allow discharge to the venue listed below.          Recommendations for follow up therapy are one component of a multi-disciplinary discharge planning process, led by the attending physician.  Recommendations may be updated based on patient status, additional functional criteria and insurance authorization.  Follow Up Recommendations Acute inpatient rehab (3hours/day)      Assistance Recommended at Discharge Frequent or constant Supervision/Assistance  Patient can return home with the following   A little help with walking and/or transfers;A little help with bathing/dressing/bathroom;Assistance with cooking/housework;Direct supervision/assist for medications management;Direct supervision/assist for financial management;Assist for transportation;Help with stairs or ramp for entrance    Equipment Recommendations None recommended by PT (TBD)  Recommendations for Other Services  Rehab consult    Functional Status Assessment Patient has had a recent decline in their functional status and demonstrates the ability to make significant improvements in function in a reasonable and predictable amount of time.     Precautions / Restrictions Precautions Precautions: Fall (szr) Restrictions Weight Bearing Restrictions: No      Mobility  Bed Mobility Overal bed mobility: Modified Independent             General bed mobility comments: extra time    Transfers Overall transfer level: Needs assistance Equipment used: None Transfers: Sit to/from Stand Sit to Stand: Min assist           General transfer comment: Min assist for balance, anterior LOB. Cues for technique.    Ambulation/Gait Ambulation/Gait assistance: Min assist Gait Distance (Feet): 25 Feet Assistive device: None, 1 person hand held assist Gait Pattern/deviations: Step-through pattern, Ataxic, Decreased stride length Gait velocity: decr   Pre-gait activities: Standing balance; romberg, stationary march, heel/toe rock. Requires min assist for LOB. General Gait Details: Unsteady, requiring min assist for balance. Tipping forward, and LOB laterally with turns. Distance limited by attachment to EEG equipment. Pt aware, cues for safety, HHA for support. Will utilize RW when enough room to increase distance.  Stairs            Wheelchair Mobility    Modified Rankin (Stroke Patients Only)       Balance Overall balance assessment: Needs assistance Sitting-balance support: No upper extremity supported,  Feet supported Sitting balance-Leahy Scale: Good     Standing balance support: No upper extremity supported, During functional activity Standing balance-Leahy Scale: Fair Standing balance comment: Episodes of LOB, increased sway. Able to stand stationary with close guard.                             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: ind     Journalist, newspaper   Dominant Hand: Right    Extremity/Trunk Assessment   Upper Extremity Assessment Upper Extremity Assessment: Defer to OT evaluation (Dysmetric finger-nose-finger on Rt; Lt WNL; RAM WNL)    Lower Extremity Assessment Lower Extremity Assessment: Overall WFL for tasks assessed (Heel shin test WNL, RAM WNL)       Communication   Communication: Expressive difficulties  Cognition Arousal/Alertness: Awake/alert Behavior During Therapy: WFL for tasks assessed/performed Overall Cognitive Status: Within Functional Limits for tasks assessed                                          General Comments General comments (skin integrity, edema, etc.): Attached to EEG - wife present supportive. IV was not attached when PT entered room - appeared to be leaking in bed, RN notified and addressed issue.    Exercises     Assessment/Plan    PT Assessment Patient needs continued PT services  PT Problem List Decreased  balance;Decreased mobility;Decreased coordination;Decreased knowledge of use of DME       PT Treatment Interventions DME instruction;Gait training;Stair training;Functional mobility training;Therapeutic activities;Therapeutic exercise;Balance training;Neuromuscular re-education;Patient/family education    PT Goals (Current goals can be found in the Care Plan section)  Acute Rehab PT Goals Patient Stated Goal: Get better PT Goal Formulation: With patient/family Time For Goal Achievement: 08/26/22 Potential to Achieve Goals: Good    Frequency Min 4X/week     Co-evaluation               AM-PAC PT "6 Clicks" Mobility  Outcome Measure Help needed turning from your back to your side while in a flat bed without using bedrails?: None Help needed moving from lying on your back to sitting on the side of a flat bed without using bedrails?: None Help needed moving to and from a bed to a chair (including a wheelchair)?: A Little Help needed standing up from a chair using your arms (e.g., wheelchair or bedside chair)?: A Little Help needed to walk in hospital room?: A Little Help needed climbing 3-5 steps with a railing? : A Lot 6 Click Score: 19    End of Session Equipment Utilized During Treatment: Gait belt Activity Tolerance: Patient tolerated treatment well Patient left: in bed;with call bell/phone within reach;with bed alarm set;with family/visitor present;with nursing/sitter in room Nurse Communication: Mobility status (IV) PT Visit Diagnosis: Unsteadiness on feet (R26.81);Other abnormalities of gait and mobility (R26.89);Ataxic gait (  R26.0);Difficulty in walking, not elsewhere classified (R26.2);Other symptoms and signs involving the nervous system (R29.898)    Time: [id number] PT Time Calculation (min) (ACUTE ONLY): 21 min   Charges:   PT Evaluation $PT Eval Low Complexity: [place], PT, DPT Physical Therapist Acute Rehabilitation Services [place]   Ellouise Newer 08/12/2022, 2:27 PM

## 2022-08-13 DIAGNOSIS — R569 Unspecified convulsions: Secondary | ICD-10-CM | POA: Diagnosis not present

## 2022-08-13 DIAGNOSIS — E1165 Type 2 diabetes mellitus with hyperglycemia: Secondary | ICD-10-CM | POA: Diagnosis not present

## 2022-08-13 DIAGNOSIS — D72829 Elevated white blood cell count, unspecified: Secondary | ICD-10-CM | POA: Diagnosis not present

## 2022-08-13 DIAGNOSIS — N3000 Acute cystitis without hematuria: Secondary | ICD-10-CM | POA: Diagnosis not present

## 2022-08-13 LAB — CBC WITH DIFFERENTIAL/PLATELET
Abs Immature Granulocytes: 0.01 10*3/uL (ref 0.00–0.07)
Basophils Absolute: 0.1 10*3/uL (ref 0.0–0.1)
Basophils Relative: 1 %
Eosinophils Absolute: 0.4 10*3/uL (ref 0.0–0.5)
Eosinophils Relative: 5 %
HCT: 36.3 % — ABNORMAL LOW (ref 39.0–52.0)
Hemoglobin: 12.3 g/dL — ABNORMAL LOW (ref 13.0–17.0)
Immature Granulocytes: 0 %
Lymphocytes Relative: 41 %
Lymphs Abs: 2.9 10*3/uL (ref 0.7–4.0)
MCH: 28.9 pg (ref 26.0–34.0)
MCHC: 33.9 g/dL (ref 30.0–36.0)
MCV: 85.2 fL (ref 80.0–100.0)
Monocytes Absolute: 0.7 10*3/uL (ref 0.1–1.0)
Monocytes Relative: 11 %
Neutro Abs: 3 10*3/uL (ref 1.7–7.7)
Neutrophils Relative %: 42 %
Platelets: 193 10*3/uL (ref 150–400)
RBC: 4.26 MIL/uL (ref 4.22–5.81)
RDW: 11.9 % (ref 11.5–15.5)
WBC: 7.1 10*3/uL (ref 4.0–10.5)
nRBC: 0 % (ref 0.0–0.2)

## 2022-08-13 LAB — COMPREHENSIVE METABOLIC PANEL
ALT: 21 U/L (ref 0–44)
AST: 19 U/L (ref 15–41)
Albumin: 2.8 g/dL — ABNORMAL LOW (ref 3.5–5.0)
Alkaline Phosphatase: 53 U/L (ref 38–126)
Anion gap: 8 (ref 5–15)
BUN: 10 mg/dL (ref 8–23)
CO2: 23 mmol/L (ref 22–32)
Calcium: 8.2 mg/dL — ABNORMAL LOW (ref 8.9–10.3)
Chloride: 104 mmol/L (ref 98–111)
Creatinine, Ser: 0.88 mg/dL (ref 0.61–1.24)
GFR, Estimated: >60 mL/min (ref >60)
Glucose, Bld: 211 mg/dL — ABNORMAL HIGH (ref 70–99)
Potassium: 3.3 mmol/L — ABNORMAL LOW (ref 3.5–5.1)
Sodium: 135 mmol/L (ref 135–145)
Total Bilirubin: 0.5 mg/dL (ref 0.3–1.2)
Total Protein: 5.4 g/dL — ABNORMAL LOW (ref 6.5–8.1)

## 2022-08-13 LAB — GLUCOSE, CAPILLARY
Glucose-Capillary: 253 mg/dL — ABNORMAL HIGH (ref 70–99)
Glucose-Capillary: 238 mg/dL — ABNORMAL HIGH (ref 70–99)
Glucose-Capillary: 234 mg/dL — ABNORMAL HIGH (ref 70–99)
Glucose-Capillary: 230 mg/dL — ABNORMAL HIGH (ref 70–99)

## 2022-08-13 LAB — URINE CULTURE: Culture: NO GROWTH

## 2022-08-13 LAB — MAGNESIUM: Magnesium: 1.8 mg/dL (ref 1.7–2.4)

## 2022-08-13 MED ORDER — INSULIN GLARGINE-YFGN 100 UNIT/ML ~~LOC~~ SOLN
25.0000 [IU] | Freq: Every day | SUBCUTANEOUS | Status: DC
  Administered 2022-08-14 – 2022-08-17 (×4): 25 [IU] via SUBCUTANEOUS
  Filled 2022-08-13 (×4): qty 0.25

## 2022-08-13 MED ORDER — VALPROATE SODIUM 100 MG/ML IV SOLN
500.0000 mg | Freq: Two times a day (BID) | INTRAVENOUS | Status: DC
  Administered 2022-08-13: 500 mg via INTRAVENOUS
  Filled 2022-08-13 (×2): qty 5

## 2022-08-13 MED ORDER — VALPROATE SODIUM 100 MG/ML IV SOLN
2000.0000 mg | Freq: Once | INTRAVENOUS | Status: AC
  Administered 2022-08-13: 2000 mg via INTRAVENOUS
  Filled 2022-08-13: qty 20

## 2022-08-13 MED ORDER — POTASSIUM CHLORIDE CRYS ER 20 MEQ PO TBCR
40.0000 meq | EXTENDED_RELEASE_TABLET | Freq: Once | ORAL | Status: AC
  Administered 2022-08-13: 40 meq via ORAL
  Filled 2022-08-13: qty 2

## 2022-08-13 NOTE — Evaluation (Signed)
Occupational Therapy Evaluation Patient Details Name: Russell Robles MRN: [id number] DOB: 13-Jan-1961 Today's Date: 08/13/2022   History of Present Illness 62 y.o. male admitted 2/21 after presenting with garbled speech and confusion. New onset seizures, possible UTI. PMHx: hypertension, hyperlipidemia, diabetes mellitus type 2, anxiety   Clinical Impression   Pt reports independence at baseline with ADLs and functional mobility, lives with spouse who can provide PRN assist at d/c. Pt currently needing min guard-min A for ADLs, mod I for bed mobility, and min guard for step pivot transfer to [facility] without AD. Pt with good awareness of deficits, reports he is able to "see" his seizures coming on, and looking toward R side when they happen. Pt denies visual symptoms at time of evaluation. Pt presenting with impairments listed below, will follow acutely. Recommend AIR at d/c.     Recommendations for follow up therapy are one component of a multi-disciplinary discharge planning process, led by the attending physician.  Recommendations may be updated based on patient status, additional functional criteria and insurance authorization.   Follow Up Recommendations  Acute inpatient rehab (3hours/day)     Assistance Recommended at Discharge Set up Supervision/Assistance  Patient can return home with the following A little help with walking and/or transfers;A little help with bathing/dressing/bathroom;Assistance with cooking/housework;Direct supervision/assist for medications management;Direct supervision/assist for financial management;Assist for transportation;Help with stairs or ramp for entrance    Functional Status Assessment  Patient has had a recent decline in their functional status and demonstrates the ability to make significant improvements in function in a reasonable and predictable amount of time.  Equipment Recommendations  BSC/3in1    Recommendations for Other Services PT  consult;Rehab consult     Precautions / Restrictions Precautions Precautions: Fall Precaution Comments: seizures; LTM EEG Restrictions Weight Bearing Restrictions: No      Mobility Bed Mobility Overal bed mobility: Modified Independent                  Transfers Overall transfer level: Needs assistance Equipment used: None Transfers: Sit to/from Stand Sit to Stand: Min guard                  Balance Overall balance assessment: Needs assistance Sitting-balance support: No upper extremity supported, Feet supported Sitting balance-Leahy Scale: Good     Standing balance support: No upper extremity supported, During functional activity Standing balance-Leahy Scale: Fair                             ADL either performed or assessed with clinical judgement   ADL Overall ADL's : Needs assistance/impaired Eating/Feeding: Modified independent;Set up   Grooming: Min guard   Upper Body Bathing: Min guard   Lower Body Bathing: Minimal assistance Lower Body Bathing Details (indicate cue type and reason): washing feet sitting EOB Upper Body Dressing : Min guard   Lower Body Dressing: Minimal assistance   Toilet Transfer: Min Patent examiner Details (indicate cue type and reason): due to limits of EEG Toileting- Clothing Manipulation and Hygiene: Min guard Toileting - Clothing Manipulation Details (indicate cue type and reason): manages bedside urinal without assistance     Functional mobility during ADLs: Min guard       Vision   Additional Comments: reports R gaze with seizures  and being able to "see" his seizures coming on; vision WFL at time of evaluation, will further assess     Perception Perception Perception Tested?: No  Praxis Praxis Praxis tested?: Not tested    Pertinent Vitals/Pain Pain Assessment Pain Assessment: No/denies pain     Hand Dominance Right   Extremity/Trunk Assessment Upper  Extremity Assessment Upper Extremity Assessment: Generalized weakness (dereased finger to nose with LUE)   Lower Extremity Assessment Lower Extremity Assessment: Defer to PT evaluation   Cervical / Trunk Assessment Cervical / Trunk Assessment: Normal   Communication Communication Communication: No difficulties   Cognition Arousal/Alertness: Awake/alert Behavior During Therapy: WFL for tasks assessed/performed Overall Cognitive Status: Within Functional Limits for tasks assessed                                       General Comments  limited; attached to LTM EEG    Exercises     Shoulder Instructions      Home Living Family/patient expects to be discharged to:: Private residence Living Arrangements: Spouse/significant other Available Help at Discharge: Family;Available PRN/intermittently Type of Home: House Home Access: Stairs to enter [employer] of Steps: 7 Entrance Stairs-Rails: Right;Left Home Layout: One level     Bathroom Shower/Tub: Walk-in shower;Tub/shower unit         Home Equipment: None          Prior Functioning/Environment Prior Level of Function : Independent/Modified Independent;Working/employed;Driving             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 ADLs Comments: ind        OT Problem List: Decreased strength;Decreased range of motion;Decreased activity tolerance;Impaired balance (sitting and/or standing);Impaired vision/perception      OT Treatment/Interventions: Self-care/ADL training;Therapeutic exercise;DME and/or AE instruction;Therapeutic activities;Visual/perceptual remediation/compensation;Patient/family education;Balance training    OT Goals(Current goals can be found in the care plan section) Acute Rehab OT Goals Patient Stated Goal: none stated OT Goal Formulation: With patient Time For Goal Achievement: 08/27/22 Potential to Achieve Goals: Good ADL Goals Pt Will  Perform Upper Body Dressing: Independently;sitting Pt Will Perform Lower Body Dressing: Independently;sitting/lateral leans;sit to/from stand Pt Will Transfer to Toilet: Independently;ambulating;regular height toilet Pt Will Perform Tub/Shower Transfer: Tub transfer;Shower transfer;Independently;ambulating  OT Frequency: Min 2X/week    Co-evaluation              AM-PAC OT "6 Clicks" Daily Activity     Outcome Measure Help from another person eating meals?: None Help from another person taking care of personal grooming?: A Little Help from another person toileting, which includes using toliet, bedpan, or urinal?: A Little Help from another person bathing (including washing, rinsing, drying)?: A Little Help from another person to put on and taking off regular upper body clothing?: A Little Help from another person to put on and taking off regular lower body clothing?: A Little 6 Click Score: 19   End of Session Nurse Communication: Mobility status  Activity Tolerance: Patient tolerated treatment well Patient left: in bed;with call bell/phone within reach;with bed alarm set  OT Visit Diagnosis: Unsteadiness on feet (R26.81);Other abnormalities of gait and mobility (R26.89);Muscle weakness (generalized) (M62.81)                Time: 1443-1500 OT Time Calculation (min): 17 min Charges:  OT General Charges $OT Visit: 1 Visit OT Evaluation $OT Eval Moderate Complexity: 1 Mod  Lonie Newsham K, OTD, OTR/L SecureChat Preferred Acute Rehab [phone]   Renaye Rakers Koonce 08/13/2022, 3:19 PM

## 2022-08-13 NOTE — Plan of Care (Signed)
  Problem: Coping: Goal: Ability to adjust to condition or change in health will improve Outcome: Progressing   Problem: Fluid Volume: Goal: Ability to maintain a balanced intake and output will improve Outcome: Progressing   Problem: Health Behavior/Discharge Planning: Goal: Ability to identify and utilize available resources and services will improve Outcome: Progressing Goal: Ability to manage health-related needs will improve Outcome: Progressing   Problem: Nutritional: Goal: Maintenance of adequate nutrition will improve Outcome: Progressing Goal: Progress toward achieving an optimal weight will improve Outcome: Progressing   Problem: Skin Integrity: Goal: Risk for impaired skin integrity will decrease Outcome: Progressing   Problem: Tissue Perfusion: Goal: Adequacy of tissue perfusion will improve Outcome: Progressing   Problem: Education: Goal: Knowledge of General Education information will improve Description: Including pain rating scale, medication(s)/side effects and non-pharmacologic comfort measures Outcome: Progressing

## 2022-08-13 NOTE — Progress Notes (Signed)
MB performed maintenance on electrodes. No skin breakdown noted. No maintenance needed at this time. Natus Unit 7 had a low disk space error message that would not allow me to log in and check impedances. Also tried to close out from Advertising copywriter, but did not want to Stop the running study due to Neurology reading data to consider d/c EEG. Unit will have to be troubleshot at a later point.

## 2022-08-13 NOTE — Progress Notes (Signed)
PROGRESS NOTE    Russell Robles  [id number] DOB: 1961-04-07 DOA: 08/10/2022 PCP: Ronnell Freshwater, NP   Brief Narrative:  62 y.o. male with medical history significant of hypertension, hyperlipidemia, diabetes mellitus type 2, anxiety presented with worsening confusion and garbled speech getting worse over the last 2 weeks.  He had a CT of the head without contrast done on 08/04/2022 in the ED which showed no acute abnormality and a chronic left basal ganglier lacunar infarct and he was referred to outpatient neurology.  His symptoms worsened so he presented to the ED.  On presentation, MRI of the brain and MRI of head without contrast did not show any acute abnormalities.  Neurology was consulted.  EEG showed seizures.  He was started on Keppra and Vimpat.    Assessment & Plan:   New onset seizures -  MRI and MRA of the brain did not note any acute abnormalities.   EEG showed seizures.  Currently undergoing LTM EEG.  Neurology following.  Currently on Keppra, Depacon, Klonopin and Vimpat. -Seizure/fall precautions.  Possible UTI -Present on admission.  Continue Rocephin.  Follow cultures  Leukocytosis -Resolved  Diabetes mellitus type 2 with hyperglycemia -A1c 13.1.  Increase Lantus to 25 units daily.  Continue CBGs with SSI.  Will need long-acting insulin on discharge. -Home regimen of Janumet and Mounjaro on hold for now. -Diabetes coordinator follow-up appreciated.  Trulicity prior authorization has already been obtained.  Hypertension -Blood pressure on the lower side.  Metoprolol on hold due to bradycardia as well  Hyperlipidemia -Continue statin  Obesity -Outpatient follow-up   DVT prophylaxis: Lovenox Code Status: Full Family Communication: Wife at bedside Disposition Plan: Status is: Inpatient Remains inpatient appropriate because: Of severity of illness  Consultants: Neurology  Procedures: EEG/LTM EEG  Antimicrobials: None   Subjective: Patient  seen and examined at bedside.  Wife present at bedside states that the number of seizures have decreased.  No overnight fever, agitation, vomiting reported. Objective: Vitals:   08/12/22 2343 08/13/22 0426 08/13/22 0635 08/13/22 0819  BP: 132/74 134/80  119/67  Pulse: 65 (!) 53  (!) 53  Resp: '19 17  18  '$ Temp: 97.6 F (36.4 C) 97.7 F (36.5 C)  (!) 97.5 F (36.4 C)  TempSrc: Oral Oral  Oral  SpO2: 96% 100%  98%  Weight:   98.2 kg   Height:   '5\' 8"'$  (1.727 m)     Intake/Output Summary (Last 24 hours) at 08/13/2022 1013 Last data filed at 08/13/2022 0359 Gross per 24 hour  Intake 1731.7 ml  Output 1250 ml  Net 481.7 ml    Filed Weights   08/13/22 0635  Weight: 98.2 kg    Examination:  General: On room air.  No distress.  Undergoing LTM EEG ENT/neck: No thyromegaly.  JVD is not elevated  respiratory: Decreased breath sounds at bases bilaterally with some crackles; no wheezing  CVS: S1-S2 heard, mild intermittent bradycardia present  abdominal: Soft, nontender, slightly distended; no organomegaly, bowel sounds are heard Extremities: Trace lower extremity edema; no cyanosis  CNS: Wakes up slightly, slow to respond, answers some questions.  No focal neurologic deficit.  Moves extremities Lymph: No obvious lymphadenopathy Skin: No obvious ecchymosis/lesions  psych: Not agitated currently.  Affect is mostly flat.   Musculoskeletal: No obvious joint swelling/deformity    Data Reviewed: I have personally reviewed following labs and imaging studies  CBC: Recent Labs  Lab 08/10/22 2108 08/10/22 2135 08/12/22 0749 08/13/22 0435  WBC 11.0*  --  8.6 7.1  NEUTROABS 5.3  --   --  3.0  HGB 16.1 16.7 13.4 12.3*  HCT 46.1 49.0 38.1* 36.3*  MCV 84.3  --  83.9 85.2  PLT 283  --  203 0000000    Basic Metabolic Panel: Recent Labs  Lab 08/10/22 2108 08/10/22 2135 08/11/22 1420 08/13/22 0435  NA 134* 134* 134* 135  K 3.8 3.6 3.8 3.3*  CL 98 97* 102 104  CO2 25  --  23 23   GLUCOSE 407* 390* 346* 211*  BUN '18 21 12 10  '$ CREATININE 1.17 1.00 1.06 0.88  CALCIUM 10.0  --  8.8* 8.2*  MG  --   --  1.9 1.8    GFR: Estimated Creatinine Clearance: 100.1 mL/min (by C-G formula based on SCr of 0.88 mg/dL). Liver Function Tests: Recent Labs  Lab 08/10/22 2108 08/13/22 0435  AST 26 19  ALT 32 21  ALKPHOS 73 53  BILITOT 0.8 0.5  PROT 7.6 5.4*  ALBUMIN 4.2 2.8*    No results for input(s): "LIPASE", "AMYLASE" in the last 168 hours. No results for input(s): "AMMONIA" in the last 168 hours. Coagulation Profile: No results for input(s): "INR", "PROTIME" in the last 168 hours. Cardiac Enzymes: Recent Labs  Lab 08/11/22 1420  CKTOTAL 38*    BNP (last 3 results) No results for input(s): "PROBNP" in the last 8760 hours. HbA1C: Recent Labs    08/10/22 2108  HGBA1C 13.1*    CBG: Recent Labs  Lab 08/12/22 0601 08/12/22 1124 08/12/22 1651 08/12/22 2121 08/13/22 0633  GLUCAP 243* 249* 204* 297* 253*    Lipid Profile: No results for input(s): "CHOL", "HDL", "LDLCALC", "TRIG", "CHOLHDL", "LDLDIRECT" in the last 72 hours. Thyroid Function Tests: No results for input(s): "TSH", "T4TOTAL", "FREET4", "T3FREE", "THYROIDAB" in the last 72 hours. Anemia Panel: No results for input(s): "VITAMINB12", "FOLATE", "FERRITIN", "TIBC", "IRON", "RETICCTPCT" in the last 72 hours. Sepsis Labs: No results for input(s): "PROCALCITON", "LATICACIDVEN" in the last 168 hours.  No results found for this or any previous visit (from the past 240 hour(s)).       Radiology Studies: No results found.      Scheduled Meds:  atorvastatin  40 mg Oral Daily   clonazePAM  1 mg Oral TID   enoxaparin (LOVENOX) injection  40 mg Subcutaneous Q24H   insulin aspart  0-15 Units Subcutaneous TID WC   insulin aspart  0-5 Units Subcutaneous QHS   insulin glargine-yfgn  20 Units Subcutaneous Daily   pantoprazole (PROTONIX) IV  40 mg Intravenous Q24H   Continuous Infusions:   sodium chloride 75 mL/hr (08/13/22 0427)   cefTRIAXone (ROCEPHIN)  IV Stopped (08/12/22 1453)   lacosamide (VIMPAT) IV Stopped (08/12/22 2235)   levETIRAcetam 2,000 mg (08/13/22 0920)   valproate sodium     Followed by   valproate sodium            Aline August, MD [facility] 08/13/2022, 10:13 AM

## 2022-08-13 NOTE — Procedures (Signed)
Patient Name: Russell Robles  MRN: [id number]  Epilepsy Attending: Lora Havens  Referring Physician/Provider: Kerney Elbe, MD  Duration: 08/12/2022 0701 to 08/13/2022 0701   Patient history: 62 year old male who has been having altered mentation and episodes of staring off his face for less than 1 second. EEG to evaluate for seizure.   Level of alertness: Awake, asleep   AEDs during EEG study: LEV, LCM, PHT, Perampanel   Technical aspects: This EEG study was done with scalp electrodes positioned according to the 10-20 International system of electrode placement. Electrical activity was reviewed with band pass filter of 1-'70Hz'$ , sensitivity of 7 uV/mm, display speed of 55m/sec with a '60Hz'$  notched filter applied as appropriate. EEG data were recorded continuously and digitally stored.  Video monitoring was available and reviewed as appropriate.   Description: The posterior dominant rhythm consists of 8 Hz activity of moderate voltage (25-35 uV) seen predominantly in posterior head regions, symmetric and reactive to eye opening and eye closing. Sleep was characterized by vertex waves, sleep spindles (12 to 14 Hz), maximal frontocentral region.     Twenty four seizures were noted arising from left parieto-occipital region.  During seizure, patient was noted to be staring off, unable to respond or follow commands, at times with right upper extremity stiffness. Patient also reported things shifting to "right side" . At the onset of seizure, EEG showed low amplitude 15 to 18 Hz beta activity in the right occipital region which then involved all of left hemisphere followed by right hemisphere.  EEG then evolved into sharply contoured 3 to 5 Hz theta-delta slowing.  Average duration 2-4 minutes each.   Hyperventilation and photic stimulation were not performed.      ABNORMALITY -Focal seizures with impaired awareness, left parieto-occipital region   IMPRESSION: This study showed 24 focal  seizures arising from parieto-occipital region, lasting about 4 minutes each. During seizure, patient was noted to be staring off, unable to respond or follow commands, at times with right upper extremity stiffness. Patient also reported things shifting to "right side" .  Seizure frequency is improving compared to previous day.    Breindel Collier OBarbra Sarks

## 2022-08-13 NOTE — Progress Notes (Signed)
Physical Therapy Treatment Patient Details Name: Russell Robles MRN: [id number] DOB: 19-Mar-1961 Today's Date: 08/13/2022   History of Present Illness 62 y.o. male admitted 2/21 after presenting with garbled speech and confusion. New onset seizures, possible UTI. PMHx: hypertension, hyperlipidemia, diabetes mellitus type 2, anxiety    PT Comments    Pt received in supine, A&O and agreeable to therapy session and with good participation and fair tolerance for transfer, standing balance and gait training at bedside. Session limited due to LTM EEG wires, but pt able to progress gait distance slightly at bedside and with improved balance using cane, no buckling or seizure activity observed. Pt did report episode of dizziness requiring brief seated break however orthostatics stable with sit>stand transfer when checked. Pt reports only very minimal fatigue at end of session, more sleepiness than actual fatigue. Pt continues to benefit from PT services to progress toward functional mobility goals.   Recommendations for follow up therapy are one component of a multi-disciplinary discharge planning process, led by the attending physician.  Recommendations may be updated based on patient status, additional functional criteria and insurance authorization.  Follow Up Recommendations  Acute inpatient rehab (3hours/day)     Assistance Recommended at Discharge Frequent or constant Supervision/Assistance  Patient can return home with the following A little help with walking and/or transfers;A little help with bathing/dressing/bathroom;Assistance with cooking/housework;Direct supervision/assist for medications management;Direct supervision/assist for financial management;Assist for transportation;Help with stairs or ramp for entrance   Equipment Recommendations  None recommended by PT (TBD)    Recommendations for Other Services       Precautions / Restrictions Precautions Precautions: Fall Precaution  Comments: seizures; LTM EEG Restrictions Weight Bearing Restrictions: No     Mobility  Bed Mobility Overal bed mobility: Modified Independent                  Transfers Overall transfer level: Needs assistance Equipment used: None Transfers: Sit to/from Stand Sit to Stand: Min guard, Supervision           General transfer comment: from EOB<>cane, min guard initially and to sit due to c/o dizziness and supervision for subsequent stands x2 reps    Ambulation/Gait Ambulation/Gait assistance: Min guard Gait Distance (Feet): 25 Feet (x2 at bedside with seated break between) Assistive device: Straight cane Gait Pattern/deviations: Step-through pattern, Decreased stride length       General Gait Details: Distance limited by attachment to EEG equipment, pt utilized cane well for stability; pt reports dizziness after initial gait trial, so took seated break, then reported dizziness improved. HR 54 bpm sitting EOB and to 62 bpm with exertion, SpO2 WFL on RA throughout.   Stairs             Wheelchair Mobility    Modified Rankin (Stroke Patients Only)       Balance Overall balance assessment: Needs assistance Sitting-balance support: No upper extremity supported, Feet supported Sitting balance-Leahy Scale: Good     Standing balance support: No upper extremity supported, During functional activity Standing balance-Leahy Scale: Fair                              Cognition Arousal/Alertness: Awake/alert Behavior During Therapy: WFL for tasks assessed/performed Overall Cognitive Status: Within Functional Limits for tasks assessed  Exercises Other Exercises Other Exercises: STS x3 reps (using UE) Other Exercises: standing hip flexion x20 reps using cane Other Exercises: cervical rotation L/R x3 reps ea (no increase in sx)    General Comments General comments (skin integrity, edema,  etc.): BP 135/85 (101) sitting EOB HR 54 bpm; BP 148/80 (102) standing at bedside, HR 62 bpm, mildly dizzy      Pertinent Vitals/Pain Pain Assessment Pain Assessment: No/denies pain    Home Living Family/patient expects to be discharged to:: Private residence Living Arrangements: Spouse/significant other Available Help at Discharge: Family;Available PRN/intermittently Type of Home: House Home Access: Stairs to enter Entrance Stairs-Rails: Psychiatric nurse of Steps: 7   Home Layout: One level Home Equipment: None      Prior Function            PT Goals (current goals can now be found in the care plan section) Acute Rehab PT Goals PT Goal Formulation: With patient/family Time For Goal Achievement: 08/26/22 Progress towards PT goals: Progressing toward goals    Frequency    Min 4X/week      PT Plan Current plan remains appropriate    Co-evaluation              AM-PAC PT "6 Clicks" Mobility   Outcome Measure  Help needed turning from your back to your side while in a flat bed without using bedrails?: None Help needed moving from lying on your back to sitting on the side of a flat bed without using bedrails?: A Little (from flat bed without rails) Help needed moving to and from a bed to a chair (including a wheelchair)?: A Little Help needed standing up from a chair using your arms (e.g., wheelchair or bedside chair)?: A Little Help needed to walk in hospital room?: A Little Help needed climbing 3-5 steps with a railing? : A Little 6 Click Score: 19    End of Session Equipment Utilized During Treatment: Gait belt Activity Tolerance: Patient tolerated treatment well;Other (comment) (mild dizziness and LTM EEG wires limiting activity in room to bedside.) Patient left: in bed;with call bell/phone within reach;with bed alarm set;Other (comment) (x4 rails up with padding for seizure precs, bed in chair posture) Nurse Communication: Mobility  status;Other (comment) (needs a new tele battery) PT Visit Diagnosis: Unsteadiness on feet (R26.81);Other abnormalities of gait and mobility (R26.89);Ataxic gait (R26.0);Difficulty in walking, not elsewhere classified (R26.2);Other symptoms and signs involving the nervous system (R29.898)     Time: [id number] PT Time Calculation (min) (ACUTE ONLY): 31 min  Charges:  $Gait Training: 8-22 mins $Therapeutic Activity: 8-22 mins                     Deshawnda Acrey P., PTA Acute Rehabilitation Services Secure Chat Preferred 9a-5:30pm Office: [place] 08/13/2022, 4:33 PM

## 2022-08-13 NOTE — Progress Notes (Signed)
Neurology Progress Note  Brief HPI: 62 yo male with PMH of anxiety, arthritis, HTN, HLD, and DM2 presented to ED on 08/10/22.  He c/o 2 weeks of episodic oscillopsia with c/o vision moving right like a projector.  His wife states that during these episodes she actually saw R facial twitching and he would stare off and not answer his family.  EEG revealed frequent seizure activity.  His blood glucose during this hospitalization found to be over 500.  Had been off of meds due to insurance issues approving his old med after an insurance change.  Off since December.  Progressively rising blood glucose readings per patient.  Of note, he was previously evaluated at [facility] on 08/04/21 for same sx.  Cth negative and lab work fine.  He was d/c'd with outpatient f/u.  Subjective: Patient seen in room. He is lying in bed with cEEG in place.  Wife at bedside.  He reports he is feeling pretty well, just tired.  He describes his history in detail to me about his visual episodes, the insurance and medication issues.  His wife describes what she saw.  He tells me he is eating and drinking fairly well.  He denies headache or pain.  No visual changes at this time.  Exam: Vitals:   08/13/22 0426 08/13/22 0819  BP: 134/80 119/67  Pulse: (!) 53 (!) 53  Resp: 17 18  Temp: 97.7 F (36.5 C) (!) 97.5 F (36.4 C)  SpO2: 100% 98%   Gen: In bed, NAD Resp: non-labored breathing, no acute distress Abd: soft, nt  Neuro: AAox4. No focal deficits. No facial fasciculations. Speech is clear. Patient is able to give a clear and coherent history. No signs of aphasia or neglect PERRL, EOMI, nystagmus noted 2-3 beats with left and right gaze. No sensory deficits to light touch throughout- face, bue, ble.  5/5 strength in all 4 extremities.  Face is symmetric.  Tongue midline.  Follows all commands.   Pertinent Labs: Lab Results  Component Value Date   WBC 7.1 08/13/2022   HGB 12.3 (L) 08/13/2022   HCT 36.3 (L) 08/13/2022    MCV 85.2 08/13/2022   PLT 193 08/13/2022      Latest Ref Rng & Units 08/13/2022    4:35 AM 08/11/2022    2:20 PM 08/10/2022    9:35 PM  BMP  Glucose 70 - 99 mg/dL 211  346  390   BUN 8 - 23 mg/dL '10  12  21   '$ Creatinine 0.61 - 1.24 mg/dL 0.88  1.06  1.00   Sodium 135 - 145 mmol/L 135  134  134   Potassium 3.5 - 5.1 mmol/L 3.3  3.8  3.6   Chloride 98 - 111 mmol/L 104  102  97   CO2 22 - 32 mmol/L 23  23    Calcium 8.9 - 10.3 mg/dL 8.2  8.8      EEG: 08/12/22 0701 to 08/13/22 0701 IMPRESSION: This study showed 24 focal seizures arising from parieto-occipital region, lasting about 4 minutes each. During seizure, patient was noted to be staring off, unable to respond or follow commands, at times with right upper extremity stiffness. Patient also reported things shifting to "right side" .   Seizure frequency is improving compared to previous day.    Lora Havens   Imaging Reviewed: 08/10/22 MRA head IMPRESSION: 1. No acute infarct. Normal diffusion-weighted imaging of the brain. 2. Normal intracranial MRA. Electronically Signed   By: Lennette Bihari  [place] M.D.   On: 08/10/2022 22:39  08/10/22 MRI brain wo No acute infarct. Normal diffusion-weighted imaging of the brain. 2. Normal intracranial MRA.   Electronically Signed   By: Ulyses Jarred M.D.   On: 08/10/2022 22:39  Assessment/ Plan: 62 yo male with new onset seizures in the setting of blood glucose over 500.  Seizures arising from left posterior quadrant.  Seizure frequency decreasing from update from epileptologist and cEEG - Continue EEG - Loaded with 2000 mg VPA this morning and then placed on 500 mg BID to start tonight. - Consider MRI brain w/ contrast - Continue to optimize blood glucose readings per primary team.  Currently 250-300. - Dilantin not helpful for these seizures and was d/c'd. - seizure precautions - IV versed for seizure lasting 5 mins or more - Continue Keppra 2000 mg every 12 hours (increased last  night) - S/p one dose of perampanel last night, 2 mg - continue Vimpat 100 mg every 12 hours (started last night)  Patient seen and examined by NP/APP with MD. MD to update note as needed.   Lorenso Courier, AGACNP-BC [facility]  Attending Neurologist's note:  I personally saw this patient, gathering history, performing a full neurologic examination, reviewing relevant labs, and formulated the assessment and plan, adding the note above for completeness and clarity to accurately reflect my thoughts  Lesleigh Noe MD-PhD [facility] [phone] Available 7 AM to 7 PM, outside these hours please contact Neurologist on call listed on AMION

## 2022-08-13 NOTE — Progress Notes (Signed)
LTM maint complete - [facility] monitored, Event button test confirmed by [facility].   

## 2022-08-14 DIAGNOSIS — R569 Unspecified convulsions: Secondary | ICD-10-CM | POA: Diagnosis not present

## 2022-08-14 LAB — GLUCOSE, CAPILLARY
Glucose-Capillary: 139 mg/dL — ABNORMAL HIGH (ref 70–99)
Glucose-Capillary: 166 mg/dL — ABNORMAL HIGH (ref 70–99)
Glucose-Capillary: 162 mg/dL — ABNORMAL HIGH (ref 70–99)
Glucose-Capillary: 340 mg/dL — ABNORMAL HIGH (ref 70–99)

## 2022-08-14 LAB — BASIC METABOLIC PANEL
Anion gap: 9 (ref 5–15)
BUN: 7 mg/dL — ABNORMAL LOW (ref 8–23)
CO2: 23 mmol/L (ref 22–32)
Calcium: 8.7 mg/dL — ABNORMAL LOW (ref 8.9–10.3)
Chloride: 106 mmol/L (ref 98–111)
Creatinine, Ser: 0.86 mg/dL (ref 0.61–1.24)
GFR, Estimated: >60 mL/min (ref >60)
Glucose, Bld: 152 mg/dL — ABNORMAL HIGH (ref 70–99)
Potassium: 3.4 mmol/L — ABNORMAL LOW (ref 3.5–5.1)
Sodium: 138 mmol/L (ref 135–145)

## 2022-08-14 LAB — MAGNESIUM: Magnesium: 2 mg/dL (ref 1.7–2.4)

## 2022-08-14 MED ORDER — LEVETIRACETAM 500 MG PO TABS
2000.0000 mg | ORAL_TABLET | Freq: Two times a day (BID) | ORAL | Status: DC
  Administered 2022-08-14 – 2022-08-17 (×7): 2000 mg via ORAL
  Filled 2022-08-14 (×7): qty 4

## 2022-08-14 MED ORDER — BISACODYL 10 MG RE SUPP: 10.0000 mg | Freq: Every day | RECTAL | Status: DC | PRN

## 2022-08-14 MED ORDER — SENNOSIDES-DOCUSATE SODIUM 8.6-50 MG PO TABS
1.0000 | ORAL_TABLET | Freq: Two times a day (BID) | ORAL | Status: DC
  Administered 2022-08-14 – 2022-08-17 (×6): 1 via ORAL
  Filled 2022-08-14 (×6): qty 1

## 2022-08-14 MED ORDER — DIVALPROEX SODIUM 250 MG PO DR TAB
500.0000 mg | DELAYED_RELEASE_TABLET | Freq: Two times a day (BID) | ORAL | Status: DC
  Administered 2022-08-14 – 2022-08-17 (×7): 500 mg via ORAL
  Filled 2022-08-14 (×7): qty 2

## 2022-08-14 MED ORDER — POTASSIUM CHLORIDE CRYS ER 20 MEQ PO TBCR
40.0000 meq | EXTENDED_RELEASE_TABLET | Freq: Once | ORAL | Status: AC
  Administered 2022-08-14: 40 meq via ORAL
  Filled 2022-08-14: qty 2

## 2022-08-14 MED ORDER — LACOSAMIDE 50 MG PO TABS
100.0000 mg | ORAL_TABLET | Freq: Two times a day (BID) | ORAL | Status: DC
  Administered 2022-08-14 – 2022-08-17 (×7): 100 mg via ORAL
  Filled 2022-08-14 (×7): qty 2

## 2022-08-14 MED ORDER — POLYETHYLENE GLYCOL 3350 17 G PO PACK
17.0000 g | PACK | Freq: Every day | ORAL | Status: DC | PRN
  Administered 2022-08-14: 17 g via ORAL
  Filled 2022-08-14: qty 1

## 2022-08-14 NOTE — Procedures (Addendum)
Patient Name: Russell Robles  MRN: [id number]  Epilepsy Attending: Lora Havens  Referring Physician/Provider: Kerney Elbe, MD  Duration: 08/13/2022 0701 to 08/14/2022 0540, 0730 to 0900   Patient history: 62 year old male who has been having altered mentation and episodes of staring off his face for less than 1 second. EEG to evaluate for seizure.   Level of alertness: Awake, asleep   AEDs during EEG study: LEV, LCM, Clonazepam, Depakote   Technical aspects: This EEG study was done with scalp electrodes positioned according to the 10-20 International system of electrode placement. Electrical activity was reviewed with band pass filter of 1-'70Hz'$ , sensitivity of 7 uV/mm, display speed of 62m/sec with a '60Hz'$  notched filter applied as appropriate. EEG data were recorded continuously and digitally stored.  Video monitoring was available and reviewed as appropriate.   Description: The posterior dominant rhythm consists of 8 Hz activity of moderate voltage (25-35 uV) seen predominantly in posterior head regions, symmetric and reactive to eye opening and eye closing. Sleep was characterized by vertex waves, sleep spindles (12 to 14 Hz), maximal frontocentral region.     four seizures were noted arising from left parieto-occipital region on 08/13/2022 at 0716, 1232, 1441 and 1556.  During seizure, patient was noted to be staring off, unable to respond or follow commands, at times with right upper extremity stiffness. Patient also reported things shifting to "right side" . At the onset of seizure, EEG showed low amplitude 15 to 18 Hz beta activity in the right occipital region which then involved all of left hemisphere followed by right hemisphere.  EEG then evolved into sharply contoured 3 to 5 Hz theta-delta slowing.  Average duration was 4 minutes each.    Hyperventilation and photic stimulation were not performed.     EEG was not recorded between 0540 to 0730 on 08/14/2022 for technical  reasons.   ABNORMALITY -Focal seizures with impaired awareness, left parieto-occipital region   IMPRESSION: This study showed four focal seizures arising from parieto-occipital region on 08/13/2022 at 0716, 1232, 1441 and 1556, lasting about 4 minutes each. During seizure, patient was noted to be staring off, unable to respond or follow commands, at times with right upper extremity stiffness. Patient also reported things shifting to "right side" .   Brandilee Pies OBarbra Sarks

## 2022-08-14 NOTE — Progress Notes (Signed)
Switched out EEG computers twice, due to not functioning properly.

## 2022-08-14 NOTE — Progress Notes (Signed)
PROGRESS NOTE    Russell Robles  [id number] DOB: December 05, 1960 DOA: 08/10/2022 PCP: Russell Freshwater, NP   Brief Narrative:  62 y.o. male with medical history significant of hypertension, hyperlipidemia, diabetes mellitus type 2, anxiety presented with worsening confusion and garbled speech getting worse over the last 2 weeks.  He had a CT of the head without contrast done on 08/04/2022 in the ED which showed no acute abnormality and a chronic left basal ganglier lacunar infarct and he was referred to outpatient neurology.  His symptoms worsened so he presented to the ED.  On presentation, MRI of the brain and MRI of head without contrast did not show any acute abnormalities.  Neurology was consulted.  EEG showed seizures.  He was started on antiepileptics.  Assessment & Plan:   New onset seizures -  MRI and MRA of the brain did not note any acute abnormalities.   EEG showed seizures.  Currently undergoing LTM EEG.  Neurology following.  Currently on antiepileptics as per neurology. -Seizure/fall precautions.  Possible UTI -Present on admission.  Currently on Rocephin.  Cultures negative so far.  Leukocytosis -Resolved  Diabetes mellitus type 2 with hyperglycemia -A1c 13.1.  Blood sugars improving.  Continue current dose of Lantus.  Continue CBGs with SSI.  Will need long-acting insulin on discharge. -Home regimen of Janumet and Mounjaro on hold for now. -Diabetes coordinator follow-up appreciated.  Trulicity prior authorization has already been obtained.  Hypokalemia -Replace  Hypertension -Blood pressure on the lower side.  Metoprolol on hold due to bradycardia as well  Hyperlipidemia -Continue statin  Obesity -Outpatient follow-up  Physical deconditioning -PT recommending CIR placement.   DVT prophylaxis: Lovenox Code Status: Full Family Communication: Wife at bedside Disposition Plan: Status is: Inpatient Remains inpatient appropriate because: Of severity of  illness.  Still undergoing LTM EEG  Consultants: Neurology  Procedures: EEG/LTM EEG  Antimicrobials: None   Subjective: Patient seen and examined at bedside.  No agitation, fever, vomiting reported. Complains of constipation. Wife present at bedside.  Objective: Vitals:   08/13/22 1931 08/14/22 0011 08/14/22 0342 08/14/22 0721  BP: (!) 140/81 121/65 131/63 133/77  Pulse: (!) 56 (!) 59 (!) 53 (!) 58  Resp: '16 18 16 18  '$ Temp: 97.8 F (36.6 C) 98 F (36.7 C) 98.3 F (36.8 C) 97.7 F (36.5 C)  TempSrc: Oral Oral  Oral  SpO2: 98% 96% 97% 93%  Weight:      Height:        Intake/Output Summary (Last 24 hours) at 08/14/2022 0802 Last data filed at 08/14/2022 0417 Gross per 24 hour  Intake 330 ml  Output 1350 ml  Net -1020 ml    Filed Weights   08/13/22 0635  Weight: 98.2 kg    Examination:  General: On room air.  No distress.  Undergoing LTM EEG respiratory: Bilateral decreased breath sounds at bases with scattered crackles CVS: Mild intermittent bradycardia present; S1-S2 heard  abdominal: Soft, nontender, distended slightly; no organomegaly; normal bowel sounds are heard  extremities: No clubbing; mild lower extremity edema present     Data Reviewed: I have personally reviewed following labs and imaging studies  CBC: Recent Labs  Lab 08/10/22 2108 08/10/22 2135 08/12/22 0749 08/13/22 0435  WBC 11.0*  --  8.6 7.1  NEUTROABS 5.3  --   --  3.0  HGB 16.1 16.7 13.4 12.3*  HCT 46.1 49.0 38.1* 36.3*  MCV 84.3  --  83.9 85.2  PLT 283  --  203  0000000    Basic Metabolic Panel: Recent Labs  Lab 08/10/22 2108 08/10/22 2135 08/11/22 1420 08/13/22 0435 08/14/22 0344  NA 134* 134* 134* 135 138  K 3.8 3.6 3.8 3.3* 3.4*  CL 98 97* 102 104 106  CO2 25  --  '23 23 23  '$ GLUCOSE 407* 390* 346* 211* 152*  BUN '18 21 12 10 '$ 7*  CREATININE 1.17 1.00 1.06 0.88 0.86  CALCIUM 10.0  --  8.8* 8.2* 8.7*  MG  --   --  1.9 1.8 2.0    GFR: Estimated Creatinine Clearance:  102.4 mL/min (by C-G formula based on SCr of 0.86 mg/dL). Liver Function Tests: Recent Labs  Lab 08/10/22 2108 08/13/22 0435  AST 26 19  ALT 32 21  ALKPHOS 73 53  BILITOT 0.8 0.5  PROT 7.6 5.4*  ALBUMIN 4.2 2.8*    No results for input(s): "LIPASE", "AMYLASE" in the last 168 hours. No results for input(s): "AMMONIA" in the last 168 hours. Coagulation Profile: No results for input(s): "INR", "PROTIME" in the last 168 hours. Cardiac Enzymes: Recent Labs  Lab 08/11/22 1420  CKTOTAL 38*    BNP (last 3 results) No results for input(s): "PROBNP" in the last 8760 hours. HbA1C: No results for input(s): "HGBA1C" in the last 72 hours.  CBG: Recent Labs  Lab 08/13/22 0633 08/13/22 1150 08/13/22 1717 08/13/22 2100 08/14/22 0610  GLUCAP 253* 238* 234* 230* 139*    Lipid Profile: No results for input(s): "CHOL", "HDL", "LDLCALC", "TRIG", "CHOLHDL", "LDLDIRECT" in the last 72 hours. Thyroid Function Tests: No results for input(s): "TSH", "T4TOTAL", "FREET4", "T3FREE", "THYROIDAB" in the last 72 hours. Anemia Panel: No results for input(s): "VITAMINB12", "FOLATE", "FERRITIN", "TIBC", "IRON", "RETICCTPCT" in the last 72 hours. Sepsis Labs: No results for input(s): "PROCALCITON", "LATICACIDVEN" in the last 168 hours.  Recent Results (from the past 240 hour(s))  Urine Culture (for pregnant, neutropenic or urologic patients or patients with an indwelling urinary catheter)     Status: None   Collection Time: 08/12/22 11:44 AM   Specimen: Urine, Clean Catch  Result Value Ref Range Status   Specimen Description URINE, CLEAN CATCH  Final   Special Requests NONE  Final   Culture   Final    NO GROWTH Performed at [facility], [address], [place], [place] [address]    Report Status 08/13/2022 FINAL  Final         Radiology Studies: No results found.      Scheduled Meds:  atorvastatin  40 mg Oral Daily   clonazePAM  1 mg Oral TID   divalproex  500 mg Oral  BID   enoxaparin (LOVENOX) injection  40 mg Subcutaneous Q24H   insulin aspart  0-15 Units Subcutaneous TID WC   insulin aspart  0-5 Units Subcutaneous QHS   insulin glargine-yfgn  25 Units Subcutaneous Daily   lacosamide  100 mg Oral BID   levETIRAcetam  2,000 mg Oral BID   pantoprazole (PROTONIX) IV  40 mg Intravenous Q24H   Continuous Infusions:  cefTRIAXone (ROCEPHIN)  IV 1 g (08/13/22 1400)          Aline August, MD [facility] 08/14/2022, 8:02 AM

## 2022-08-15 ENCOUNTER — Inpatient Hospital Stay (HOSPITAL_COMMUNITY): Payer: No Typology Code available for payment source

## 2022-08-15 DIAGNOSIS — R569 Unspecified convulsions: Secondary | ICD-10-CM | POA: Diagnosis not present

## 2022-08-15 LAB — BASIC METABOLIC PANEL
Anion gap: 9 (ref 5–15)
BUN: 7 mg/dL — ABNORMAL LOW (ref 8–23)
CO2: 24 mmol/L (ref 22–32)
Calcium: 8.8 mg/dL — ABNORMAL LOW (ref 8.9–10.3)
Chloride: 104 mmol/L (ref 98–111)
Creatinine, Ser: 0.83 mg/dL (ref 0.61–1.24)
GFR, Estimated: >60 mL/min (ref >60)
Glucose, Bld: 116 mg/dL — ABNORMAL HIGH (ref 70–99)
Potassium: 3.4 mmol/L — ABNORMAL LOW (ref 3.5–5.1)
Sodium: 137 mmol/L (ref 135–145)

## 2022-08-15 LAB — MAGNESIUM: Magnesium: 1.9 mg/dL (ref 1.7–2.4)

## 2022-08-15 LAB — VALPROIC ACID LEVEL: Valproic Acid Lvl: 34 ug/mL — ABNORMAL LOW (ref 50.0–100.0)

## 2022-08-15 LAB — GLUCOSE, CAPILLARY
Glucose-Capillary: 129 mg/dL — ABNORMAL HIGH (ref 70–99)
Glucose-Capillary: 208 mg/dL — ABNORMAL HIGH (ref 70–99)
Glucose-Capillary: 159 mg/dL — ABNORMAL HIGH (ref 70–99)
Glucose-Capillary: 196 mg/dL — ABNORMAL HIGH (ref 70–99)

## 2022-08-15 LAB — AMMONIA: Ammonia: 34 umol/L (ref 9–35)

## 2022-08-15 MED ORDER — ALUM & MAG HYDROXIDE-SIMETH 200-200-20 MG/5ML PO SUSP: 15.0000 mL | ORAL | Status: DC | PRN

## 2022-08-15 MED ORDER — LIVING WELL WITH DIABETES BOOK
Freq: Once | Status: AC
  Filled 2022-08-15: qty 1

## 2022-08-15 MED ORDER — PANTOPRAZOLE SODIUM 40 MG PO TBEC
40.0000 mg | DELAYED_RELEASE_TABLET | Freq: Every day | ORAL | Status: DC
  Administered 2022-08-16 – 2022-08-17 (×2): 40 mg via ORAL
  Filled 2022-08-15 (×2): qty 1

## 2022-08-15 MED ORDER — POTASSIUM CHLORIDE CRYS ER 20 MEQ PO TBCR
40.0000 meq | EXTENDED_RELEASE_TABLET | Freq: Once | ORAL | Status: AC
  Administered 2022-08-15: 40 meq via ORAL
  Filled 2022-08-15: qty 2

## 2022-08-15 MED ORDER — LORAZEPAM 1 MG PO TABS
2.0000 mg | ORAL_TABLET | Freq: Once | ORAL | Status: AC
  Administered 2022-08-15: 2 mg via ORAL
  Filled 2022-08-15: qty 2

## 2022-08-15 NOTE — Progress Notes (Signed)
PROGRESS NOTE    Russell Robles  [id number] DOB: Jul 07, 1960 DOA: 08/10/2022 PCP: Ronnell Freshwater, NP   Brief Narrative:  62 y.o. male with medical history significant of hypertension, hyperlipidemia, diabetes mellitus type 2, anxiety presented with worsening confusion and garbled speech getting worse over the last 2 weeks.  He had a CT of the head without contrast done on 08/04/2022 in the ED which showed no acute abnormality and a chronic left basal ganglier lacunar infarct and he was referred to outpatient neurology.  His symptoms worsened so he presented to the ED.  On presentation, MRI of the brain and MRI of head without contrast did not show any acute abnormalities.  Neurology was consulted.  EEG showed seizures.  He was started on antiepileptics.  He is currently undergoing LTM EEG.  Assessment & Plan:   New onset seizures -  MRI and MRA of the brain did not note any acute abnormalities.   EEG showed seizures.  Currently undergoing LTM EEG.  Neurology following.  Currently on antiepileptics as per neurology. -Seizure/fall precautions.  Possible UTI -Present on admission.  Treated with 3 days of Rocephin and discontinued on 08/14/2022.  Cultures negative so far.  Leukocytosis -Resolved  Diabetes mellitus type 2 with hyperglycemia -A1c 13.1.  Blood sugars improving.  Continue current dose of Lantus.  Continue CBGs with SSI.  Will need long-acting insulin on discharge. -Home regimen of Janumet and Mounjaro on hold for now. -Diabetes coordinator follow-up appreciated.  Trulicity prior authorization has already been obtained.  Hypokalemia -Replace  Hypertension -Blood pressure on the lower side.  Metoprolol on hold due to bradycardia as well  Hyperlipidemia -Continue statin  Obesity -Outpatient follow-up  Physical deconditioning -PT recommending CIR placement.   DVT prophylaxis: Lovenox Code Status: Full Family Communication: Wife at bedside Disposition  Plan: Status is: Inpatient Remains inpatient appropriate because: Of severity of illness.  Still undergoing LTM EEG.  Need for CIR placement.  Consultants: Neurology  Procedures: EEG/LTM EEG  Antimicrobials: None   Subjective: Patient seen and examined at bedside.  No fever, vomiting, chest pain or shortness of breath reported. Feels better. Wife present at bedside.   Objective: Vitals:   08/14/22 1250 08/14/22 1525 08/14/22 2023 08/15/22 0334  BP: 121/82 (!) 143/91 131/78 128/73  Pulse: (!) 57 60 64 (!) 50  Resp: '18 18 15 18  '$ Temp: 97.7 F (36.5 C) 97.9 F (36.6 C) 98.5 F (36.9 C) 98.6 F (37 C)  TempSrc: Oral Oral Oral   SpO2: 95% 95% 93% 92%  Weight:      Height:       No intake or output data in the 24 hours ending 08/15/22 0823  Filed Weights   08/13/22 0635  Weight: 98.2 kg    Examination:  General: Still undergoing LTM EEG.  No acute distress.  On room air currently.  Respiratory: Decreased breath sounds at bases bilaterally with some crackles  CVS: S1 and S2 are heard; currently bradycardic  abdominal: Soft, nontender, mildly distended; no organomegaly; bowel sounds normally heard  extremities: Trace lower extremity edema present; no cyanosis    Data Reviewed: I have personally reviewed following labs and imaging studies  CBC: Recent Labs  Lab 08/10/22 2108 08/10/22 2135 08/12/22 0749 08/13/22 0435  WBC 11.0*  --  8.6 7.1  NEUTROABS 5.3  --   --  3.0  HGB 16.1 16.7 13.4 12.3*  HCT 46.1 49.0 38.1* 36.3*  MCV 84.3  --  83.9 85.2  PLT  283  --  203 0000000    Basic Metabolic Panel: Recent Labs  Lab 08/10/22 2108 08/10/22 2135 08/11/22 1420 08/13/22 0435 08/14/22 0344 08/15/22 0410  NA 134* 134* 134* 135 138 137  K 3.8 3.6 3.8 3.3* 3.4* 3.4*  CL 98 97* 102 104 106 104  CO2 25  --  '23 23 23 24  '$ GLUCOSE 407* 390* 346* 211* 152* 116*  BUN '18 21 12 10 '$ 7* 7*  CREATININE 1.17 1.00 1.06 0.88 0.86 0.83  CALCIUM 10.0  --  8.8* 8.2* 8.7* 8.8*  MG   --   --  1.9 1.8 2.0 1.9    GFR: Estimated Creatinine Clearance: 106.2 mL/min (by C-G formula based on SCr of 0.83 mg/dL). Liver Function Tests: Recent Labs  Lab 08/10/22 2108 08/13/22 0435  AST 26 19  ALT 32 21  ALKPHOS 73 53  BILITOT 0.8 0.5  PROT 7.6 5.4*  ALBUMIN 4.2 2.8*    No results for input(s): "LIPASE", "AMYLASE" in the last 168 hours. No results for input(s): "AMMONIA" in the last 168 hours. Coagulation Profile: No results for input(s): "INR", "PROTIME" in the last 168 hours. Cardiac Enzymes: Recent Labs  Lab 08/11/22 1420  CKTOTAL 38*    BNP (last 3 results) No results for input(s): "PROBNP" in the last 8760 hours. HbA1C: No results for input(s): "HGBA1C" in the last 72 hours.  CBG: Recent Labs  Lab 08/14/22 0610 08/14/22 1247 08/14/22 1702 08/14/22 2145 08/15/22 0616  GLUCAP 139* 166* 162* 340* 129*    Lipid Profile: No results for input(s): "CHOL", "HDL", "LDLCALC", "TRIG", "CHOLHDL", "LDLDIRECT" in the last 72 hours. Thyroid Function Tests: No results for input(s): "TSH", "T4TOTAL", "FREET4", "T3FREE", "THYROIDAB" in the last 72 hours. Anemia Panel: No results for input(s): "VITAMINB12", "FOLATE", "FERRITIN", "TIBC", "IRON", "RETICCTPCT" in the last 72 hours. Sepsis Labs: No results for input(s): "PROCALCITON", "LATICACIDVEN" in the last 168 hours.  Recent Results (from the past 240 hour(s))  Urine Culture (for pregnant, neutropenic or urologic patients or patients with an indwelling urinary catheter)     Status: None   Collection Time: 08/12/22 11:44 AM   Specimen: Urine, Clean Catch  Result Value Ref Range Status   Specimen Description URINE, CLEAN CATCH  Final   Special Requests NONE  Final   Culture   Final    NO GROWTH Performed at [facility], [address], [place], [place] [address]    Report Status 08/13/2022 FINAL  Final         Radiology Studies: No results found.      Scheduled Meds:  atorvastatin  40  mg Oral Daily   clonazePAM  1 mg Oral TID   divalproex  500 mg Oral BID   enoxaparin (LOVENOX) injection  40 mg Subcutaneous Q24H   insulin aspart  0-15 Units Subcutaneous TID WC   insulin aspart  0-5 Units Subcutaneous QHS   insulin glargine-yfgn  25 Units Subcutaneous Daily   lacosamide  100 mg Oral BID   levETIRAcetam  2,000 mg Oral BID   pantoprazole (PROTONIX) IV  40 mg Intravenous Q24H   senna-docusate  1 tablet Oral BID   Continuous Infusions:          Aline August, MD [facility] 08/15/2022, 8:23 AM

## 2022-08-15 NOTE — Progress Notes (Signed)
Occupational Therapy Treatment Patient Details Name: Russell Robles MRN: [id number] DOB: [birth year] Today's Date: 08/15/2022   History of present illness 62 y.o. male admitted 2/21 after presenting with garbled speech and confusion. New onset seizures, possible UTI. PMHx: hypertension, hyperlipidemia, diabetes mellitus type 2, anxiety   OT comments  Patient asleep upon entry but easily aroused and agreeable to OT treatment. Patient demonstrated no LOB while ambulating to sink, while performing grooming tasks, or seated performing LB dressing. Patient ambulated to bathroom without an assistive device and performed toilet transfer with min guard. Patient instructed on safe shower transfer and use of grab bars but patient attempted without grab bars and experienced LOB but was able to correct with grab bars. Patient instructed on safety and possible use of shower chair at home for increased safety. Patient would benefit from further OT services in AIR setting to address balance, safety, and self care. Acute OT to continue to follow.    Recommendations for follow up therapy are one component of a multi-disciplinary discharge planning process, led by the attending physician.  Recommendations may be updated based on patient status, additional functional criteria and insurance authorization.    Follow Up Recommendations  Acute inpatient rehab (3hours/day)     Assistance Recommended at Discharge Set up Supervision/Assistance  Patient can return home with the following  A little help with walking and/or transfers;A little help with bathing/dressing/bathroom;Assistance with cooking/housework;Direct supervision/assist for medications management;Direct supervision/assist for financial management;Assist for transportation;Help with stairs or ramp for entrance   Equipment Recommendations  BSC/3in1    Recommendations for Other Services      Precautions / Restrictions Precautions Precautions:  Fall Precaution Comments:  (seizures) Restrictions Weight Bearing Restrictions: No       Mobility Bed Mobility Overal bed mobility: Modified Independent                  Transfers Overall transfer level: Needs assistance Equipment used: None Transfers: Sit to/from Stand Sit to Stand: Min guard           General transfer comment: ambulated to bathroom for toilet and shower transfer with cues for pacing self and safety.  LOB during shower transfer with patient able to correct with use of grab bars     Balance Overall balance assessment: Needs assistance Sitting-balance support: No upper extremity supported, Feet supported Sitting balance-Leahy Scale: Good     Standing balance support: No upper extremity supported, During functional activity Standing balance-Leahy Scale: Fair Standing balance comment: able to maintain static standing balance.                           ADL either performed or assessed with clinical judgement   ADL Overall ADL's : Needs assistance/impaired     Grooming: Wash/dry hands;Wash/dry face;Oral care;Min guard;Standing Grooming Details (indicate cue type and reason): patient had difficulty holding items with LUE, patient stated it was because he was sleepy             Lower Body Dressing: Supervision/safety;Sitting/lateral leans Lower Body Dressing Details (indicate cue type and reason): able to doff and donn socks while seated on EOB Toilet Transfer: Min guard;Ambulation;Regular Glass blower/designer Details (indicate cue type and reason): ambulated to bathroom without an AD and min guard for safety     Tub/ Shower Transfer: Min guard;Walk-in shower;Grab bars Tub/Shower Transfer Details (indicate cue type and reason): stepped into walker in shower with loss of balance stepping over rise, used  rail to assist with balance,   General ADL Comments: required cues for pacing self for safety    Extremity/Trunk Assessment               Vision       Perception     Praxis      Cognition Arousal/Alertness: Lethargic, Suspect due to medications Behavior During Therapy: WFL for tasks assessed/performed Overall Cognitive Status: Within Functional Limits for tasks assessed                                          Exercises      Shoulder Instructions       General Comments      Pertinent Vitals/ Pain       Pain Assessment Pain Assessment: No/denies pain  Home Living                                          Prior Functioning/Environment              Frequency  Min 2X/week        Progress Toward Goals  OT Goals(current goals can now be found in the care plan section)  Progress towards OT goals: Progressing toward goals  Acute Rehab OT Goals Patient Stated Goal: get better OT Goal Formulation: With patient Time For Goal Achievement: 08/27/22 Potential to Achieve Goals: Good ADL Goals Pt Will Perform Upper Body Dressing: Independently;sitting Pt Will Perform Lower Body Dressing: Independently;sitting/lateral leans;sit to/from stand Pt Will Transfer to Toilet: Independently;ambulating;regular height toilet Pt Will Perform Tub/Shower Transfer: Tub transfer;Shower transfer;Independently;ambulating  Plan Discharge plan remains appropriate    Co-evaluation                 AM-PAC OT "6 Clicks" Daily Activity     Outcome Measure   Help from another person eating meals?: None Help from another person taking care of personal grooming?: A Little Help from another person toileting, which includes using toliet, bedpan, or urinal?: A Little Help from another person bathing (including washing, rinsing, drying)?: A Little Help from another person to put on and taking off regular upper body clothing?: A Little Help from another person to put on and taking off regular lower body clothing?: A Little 6 Click Score: 19    End of Session Equipment  Utilized During Treatment: Gait belt  OT Visit Diagnosis: Unsteadiness on feet (R26.81);Other abnormalities of gait and mobility (R26.89);Muscle weakness (generalized) (M62.81)   Activity Tolerance Patient tolerated treatment well   Patient Left in bed;with call bell/phone within reach;with family/visitor present   Nurse Communication Mobility status        Time: [id number] OT Time Calculation (min): 17 min  Charges: OT General Charges $OT Visit: 1 Visit OT Treatments $Self Care/Home Management : 8-22 mins  Lodema Hong, [place]  [facility] [place] 08/15/2022, 1:18 PM

## 2022-08-15 NOTE — Procedures (Addendum)
Patient Name: Rafeeq Bach  MRN: [id number]  Epilepsy Attending: Lora Havens  Referring Physician/Provider: Kerney Elbe, MD  Duration: 08/14/2022 0900 to 08/15/2022 1009   Patient history: 62 year old male who has been having altered mentation and episodes of staring off his face for less than 1 second. EEG to evaluate for seizure.   Level of alertness: Awake, asleep   AEDs during EEG study: LEV, LCM, Clonazepam, Depakote   Technical aspects: This EEG study was done with scalp electrodes positioned according to the 10-20 International system of electrode placement. Electrical activity was reviewed with band pass filter of 1-'70Hz'$ , sensitivity of 7 uV/mm, display speed of 72m/sec with a '60Hz'$  notched filter applied as appropriate. EEG data were recorded continuously and digitally stored.  Video monitoring was available and reviewed as appropriate.   Description: The posterior dominant rhythm consists of 8 Hz activity of moderate voltage (25-35 uV) seen predominantly in posterior head regions, symmetric and reactive to eye opening and eye closing. Sleep was characterized by vertex waves, sleep spindles (12 to 14 Hz), maximal frontocentral region.     IMPRESSION: This study is within normal limits.  No further seizures or epileptiform discharges were seen during the study.   Jamari Moten OBarbra Sarks

## 2022-08-15 NOTE — Progress Notes (Addendum)
Inpatient Diabetes Program Recommendations  AACE/ADA: New Consensus Statement on Inpatient Glycemic Control (2015)  Target Ranges:  Prepandial:   less than 140 mg/dL      Peak postprandial:   less than 180 mg/dL (1-2 hours)      Critically ill patients:  140 - 180 mg/dL   Lab Results  Component Value Date   GLUCAP 129 (H) 08/15/2022   HGBA1C 13.1 (H) 08/10/2022    Review of Glycemic Control  Latest Reference Range & Units 08/14/22 06:10 08/14/22 12:47 08/14/22 17:02 08/14/22 21:45 08/15/22 06:16  Glucose-Capillary 70 - 99 mg/dL 139 (H) 166 (H) 162 (H) 340 (H) 129 (H)   Diabetes history: DM Outpatient Diabetes medications:  Janumet 50-500 mg bid Mounjaro 5 mg weekly (08/10/22-started) Current orders for Inpatient glycemic control:  Novolog 0-15 tid with meals and HS Semglee 25 units daily Inpatient Diabetes Program Recommendations:     Blood sugars look mostly okay.  CBG increased at bedtime.  Will follow.  Thanks,  Adah Perl, RN, BC-ADM Inpatient Diabetes Coordinator Pager [phone]  (8a-5p)  Addendum:  Spoke with patient and wife.  Dexcom is working well sending readings to his phone.  Still unsure of D/C plan?  Ordered LWWD booklet for them and also reviewed plate method.  Wife is familiar with Weight watchers and knows how to do low carb alternatives.  Encouraged using Dexcom readings to det. What foods raise blood sugar and lifestyle modifications.  Plan is for d/c on basal insulin, Trulicity and Janumet.

## 2022-08-15 NOTE — Progress Notes (Signed)
Subjective: Denies any further seizures since Saturday. No new concerns  ROS: negative except above  Examination  Vital signs in last 24 hours: Temp:  [97.6 F (36.4 C)-98.6 F (37 C)] 97.6 F (36.4 C) (02/26 1505) Pulse Rate:  [50-64] 60 (02/26 1505) Resp:  [15-18] 18 (02/26 1505) BP: (128-153)/(73-91) 153/84 (02/26 1505) SpO2:  [92 %-95 %] 95 % (02/26 1505)  General: lying in bed, NAD Neuro: MS: Alert, oriented, follows commands  CN: pupils equal and reactive,  EOMI, face symmetric, tongue midline, normal sensation over face, Motor: 5/5 strength in all 4 extremities  Basic Metabolic Panel: Recent Labs  Lab 08/10/22 2108 08/10/22 2135 08/11/22 1420 08/13/22 0435 08/14/22 0344 08/15/22 0410  NA 134* 134* 134* 135 138 137  K 3.8 3.6 3.8 3.3* 3.4* 3.4*  CL 98 97* 102 104 106 104  CO2 25  --  '23 23 23 24  '$ GLUCOSE 407* 390* 346* 211* 152* 116*  BUN '18 21 12 10 '$ 7* 7*  CREATININE 1.17 1.00 1.06 0.88 0.86 0.83  CALCIUM 10.0  --  8.8* 8.2* 8.7* 8.8*  MG  --   --  1.9 1.8 2.0 1.9    CBC: Recent Labs  Lab 08/10/22 2108 08/10/22 2135 08/12/22 0749 08/13/22 0435  WBC 11.0*  --  8.6 7.1  NEUTROABS 5.3  --   --  3.0  HGB 16.1 16.7 13.4 12.3*  HCT 46.1 49.0 38.1* 36.3*  MCV 84.3  --  83.9 85.2  PLT 283  --  203 193     Coagulation Studies: No results for input(s): "LABPROT", "INR" in the last 72 hours.  Imaging MR Brain  wo contrast 07/26/2022: No acute intracranial abnormality. No structural lesion identified.   ASSESSMENT AND PLAN:  62 year old male with intermittent confusion in the setting of blood glucose over 500.  EEG showed intermittent seizures arising from left posterior quadrant.   Provoked seizures in the setting of hyperglycemia -Continue following AEDS: Keppra '2000mg'$  BID Vimpat '100mg'$  BID Depakote DR '500mg'$  BID Clonazepam '1mg'$  TID -Recommend f/u with Dr Delice Lesch. Can likely start weaning AEDs if remains seizure free as these were provoked  seizures -Continue seizure precautions -Discussed plan in detail with patient and wife at bedside, Dr. Starla Link  via secure chat  Seizure precautions: Per [facility] statutes, patients with seizures are not allowed to drive until they have been seizure-free for six months and cleared by a physician    Use caution when using heavy equipment or power tools. Avoid working on ladders or at heights. Take showers instead of baths. Ensure the water temperature is not too high on the home water heater. Do not go swimming alone. Do not lock yourself in a room alone (i.e. bathroom). When caring for infants or small children, sit down when holding, feeding, or changing them to minimize risk of injury to the child in the event you have a seizure. Maintain good sleep hygiene. Avoid alcohol.    If patient has another seizure, call 911 and bring them back to the ED if: A.  The seizure lasts longer than 5 minutes.      B.  The patient doesn't wake shortly after the seizure or has new problems such as difficulty seeing, speaking or moving following the seizure C.  The patient was injured during the seizure D.  The patient has a temperature over 102 F (39C) E.  The patient vomited during the seizure and now is having trouble breathing    During  the Seizure   - First, ensure adequate ventilation and place patients on the floor on their left side  Loosen clothing around the neck and ensure the airway is patent. If the patient is clenching the teeth, do not force the mouth open with any object as this can cause severe damage - Remove all items from the surrounding that can be hazardous. The patient may be oblivious to what's happening and may not even know what he or she is doing. If the patient is confused and wandering, either gently guide him/her away and block access to outside areas - Reassure the individual and be comforting - Call 911. In most cases, the seizure ends before EMS arrives. However, there  are cases when seizures may last over 3 to 5 minutes. Or the individual may have developed breathing difficulties or severe injuries. If a pregnant patient or a person with diabetes develops a seizure, it is prudent to call an ambulance.    After the Seizure (Postictal Stage)   After a seizure, most patients experience confusion, fatigue, muscle pain and/or a headache. Thus, one should permit the individual to sleep. For the next few days, reassurance is essential. Being calm and helping reorient the person is also of importance.   Most seizures are painless and end spontaneously. Seizures are not harmful to others but can lead to complications such as stress on the lungs, brain and the heart. Individuals with prior lung problems may develop labored breathing and respiratory distress.     I have spent a total of  35  minutes with the patient reviewing hospital notes,  test results, labs and examining the patient as well as establishing an assessment and plan that was discussed personally with the patient.  > 50% of time was spent in direct patient care.    Zeb Comfort Epilepsy [facility] For questions after 5pm please refer to AMION to reach the Neurologist on call

## 2022-08-15 NOTE — Progress Notes (Signed)
EEG D/C'd. No skin break down. [facility] noted.

## 2022-08-16 DIAGNOSIS — R569 Unspecified convulsions: Secondary | ICD-10-CM | POA: Diagnosis not present

## 2022-08-16 LAB — TROPONIN I (HIGH SENSITIVITY): Troponin I (High Sensitivity): 6 ng/L (ref <18)

## 2022-08-16 LAB — GLUCOSE, CAPILLARY
Glucose-Capillary: 159 mg/dL — ABNORMAL HIGH (ref 70–99)
Glucose-Capillary: 220 mg/dL — ABNORMAL HIGH (ref 70–99)
Glucose-Capillary: 167 mg/dL — ABNORMAL HIGH (ref 70–99)
Glucose-Capillary: 262 mg/dL — ABNORMAL HIGH (ref 70–99)

## 2022-08-16 MED ORDER — NITROGLYCERIN 0.4 MG SL SUBL: 0.4000 mg | SUBLINGUAL_TABLET | SUBLINGUAL | Status: DC | PRN

## 2022-08-16 MED ORDER — ACETAMINOPHEN 325 MG PO TABS
650.0000 mg | ORAL_TABLET | Freq: Four times a day (QID) | ORAL | Status: DC | PRN
  Administered 2022-08-16: 650 mg via ORAL
  Filled 2022-08-16: qty 2

## 2022-08-16 NOTE — Progress Notes (Signed)
Inpatient Rehabilitation Admissions Coordinator   Noted PT has changed their recommendation to outpatient. We will not pursue CIR admit and sign off. Acute team and TOC will be alerted.  Danne Baxter, RN, MSN Rehab Admissions Coordinator [phone] 08/16/2022 2:20 PM

## 2022-08-16 NOTE — TOC Initial Note (Signed)
Transition of Care [facility]) - Initial/Assessment Note    Patient Details  Name: Russell Robles MRN: [id number] Date of Birth: 28-Nov-1960  [facility]) CM/SW Contact:    Pollie Friar, RN Phone Number: 08/16/2022, 3:36 PM  Clinical Narrative:                 CM met with the patient and his spouse at the bedside. Pt slept through visit. Therapies have changed their recommendations to outpatient therapy. Wife states they prefer to attend at [facility]. Orders in Epic and information on the AVS. Wife aware they will need to call and schedule an appointment. Wife denies issues with pt's home medications.  Wife is able to provide needed transportation.  TOC following for further d/c needs.   Expected Discharge Plan: OP Rehab Barriers to Discharge: Continued Medical Work up   Patient Goals and CMS Choice     Choice offered to / list presented to : Spouse      Expected Discharge Plan and Services   Discharge Planning Services: CM Consult   Living arrangements for the past 2 months: Single Family Home                                      Prior Living Arrangements/Services Living arrangements for the past 2 months: Single Family Home Lives with:: Spouse Patient language and need for interpreter reviewed:: Yes Do you feel safe going back to the place where you live?: Yes            Criminal Activity/Legal Involvement Pertinent to Current Situation/Hospitalization: No - Comment as needed  Activities of Daily Living Home Assistive Devices/Equipment: None ADL Screening (condition at time of admission) Patient's cognitive ability adequate to safely complete daily activities?: Yes Is the patient deaf or have difficulty hearing?: No Does the patient have difficulty seeing, even when wearing glasses/contacts?: No Does the patient have difficulty concentrating, remembering, or making decisions?: No Patient able to express need for assistance with  ADLs?: Yes Does the patient have difficulty dressing or bathing?: No Independently performs ADLs?: Yes (appropriate for developmental age) Does the patient have difficulty walking or climbing stairs?: No Weakness of Legs: None Weakness of Arms/Hands: None  Permission Sought/Granted                  Emotional Assessment Appearance:: Appears stated age         Psych Involvement: No (comment)  Admission diagnosis:  Confusion [R41.0] Seizures ([place]) [R56.9] Dizzy spells [R42] Acute lower UTI [N39.0] Episodes of staring [R40.4] Patient Active Problem List   Diagnosis Date Noted   Seizures ([place]) 08/11/2022   Leukocytosis 08/11/2022   Urinary tract infection 08/11/2022   Sinus bradycardia 08/11/2022   Lacunar infarction ([place]) 08/10/2022   Visual disturbance as complication of stroke [id number]   Chest pain 05/17/2022   Right hand pain 11/18/2021   Herpes zoster without complication [id number]   Rash and nonspecific skin eruption 04/16/2021   Acute eczematoid otitis externa of right ear 02/28/2021   Vertigo 02/28/2021   Strain of long head of biceps 04/27/2020   Strain of rotator cuff capsule 04/17/2018   Impingement syndrome of shoulder region, left 04/16/2018   Bursitis of shoulder, left 04/16/2018   Cortical age-related cataract of both eyes 02/01/2018   Dermatochalasis of both upper eyelids 02/01/2018   Nuclear sclerotic cataract of both eyes 02/01/2018  Type 2 diabetes mellitus without retinopathy ([place]) 02/01/2018   new onset Murmur, heart 12/19/2017   Acute pain of left shoulder 10/17/2017   Obesity, Class I, BMI 30-34.9 04/12/2017   Inactivity 04/12/2017   Age-related nuclear cataract of both eyes 06/18/2015   Uncontrolled type 2 diabetes mellitus with hyperglycemia, without long-term current use of insulin ([place]) 06/18/2015   Hypertension associated with diabetes ([place]) 06/18/2015   Hyperlipidemia associated with type 2 diabetes mellitus ([place]) 06/18/2015    Presbyopia 06/18/2015   Inguinal hernia unilateral, non-recurrent 12/13/2010   PCP:  Ronnell Freshwater, NP Pharmacy:   [facility], [place] [place] [address] Phone: [phone] Fax: [phone]  [facility] [id number] - [address] ([address]), [place] [id number]  ([place]) [place] [address] Phone: [phone] Fax: [phone]  [place], [place] [address] Phone: [phone] Fax: [phone]  [facility] [address], [place] - [place] [address] Phone: [phone] Fax: [phone]     Social Determinants of Health (SDOH) Social History: [place]: No Food Insecurity (08/12/2022)  Housing: Low Risk  (08/12/2022)  Transportation Needs: No Transportation Needs (08/12/2022)  Utilities: Not At Risk (08/12/2022)  Depression (PHQ2-9): Low Risk  (05/17/2022)  Tobacco Use: Low Risk  (08/12/2022)   SDOH Interventions:     Readmission Risk Interventions     No data to display

## 2022-08-16 NOTE — Progress Notes (Signed)
Physical Therapy Treatment Patient Details Name: Russell Robles MRN: [id number] DOB: Feb 05, 1961 Today's Date: 08/16/2022   History of Present Illness 62 y.o. male admitted 2/21 after presenting with garbled speech and confusion. New onset seizures, possible UTI. PMHx: hypertension, hyperlipidemia, diabetes mellitus type 2, anxiety    PT Comments    Pt making great progress towards his physical therapy goals, exhibiting improved activity tolerance and ambulation distance. Reports feeling weaker than baseline. Pt ambulating 350 ft with no assistive device at a supervision-min guard assist level. Negotiated 10 steps with railings. Demonstrates static and dynamic balance deficits, which is a change from his baseline independence. Would benefit from follow up OPPT to address.     Recommendations for follow up therapy are one component of a multi-disciplinary discharge planning process, led by the attending physician.  Recommendations may be updated based on patient status, additional functional criteria and insurance authorization.  Follow Up Recommendations  Outpatient PT     Assistance Recommended at Discharge Frequent or constant Supervision/Assistance  Patient can return home with the following A little help with walking and/or transfers;A little help with bathing/dressing/bathroom;Assistance with cooking/housework;Direct supervision/assist for medications management;Direct supervision/assist for financial management;Assist for transportation;Help with stairs or ramp for entrance   Equipment Recommendations  None recommended by PT    Recommendations for Other Services       Precautions / Restrictions Precautions Precautions: Fall Precaution Comments: seizures Restrictions Weight Bearing Restrictions: No     Mobility  Bed Mobility Overal bed mobility: Modified Independent                  Transfers Overall transfer level: Needs assistance Equipment used:  None Transfers: Sit to/from Stand Sit to Stand: Supervision                Ambulation/Gait Ambulation/Gait assistance: Supervision, Min guard Gait Distance (Feet): 350 Feet Assistive device: None Gait Pattern/deviations: Step-through pattern, Decreased stride length Gait velocity: decreased     General Gait Details: Supervision for level surfaces, min guard assist with balance challenge i.e. stepping over obstacles   Stairs Stairs: Yes Stairs assistance: Min guard Stair Management: Two rails Number of Stairs: 10 General stair comments: Cues for step by step with descent   Wheelchair Mobility    Modified Rankin (Stroke Patients Only)       Balance Overall balance assessment: Needs assistance Sitting-balance support: No upper extremity supported, Feet supported Sitting balance-Leahy Scale: Good     Standing balance support: No upper extremity supported, During functional activity Standing balance-Leahy Scale: Fair           Rhomberg - Eyes Opened: 10 Rhomberg - Eyes Closed: 10                Cognition Arousal/Alertness: Awake/alert Behavior During Therapy: WFL for tasks assessed/performed Overall Cognitive Status: Within Functional Limits for tasks assessed                                          Exercises      General Comments        Pertinent Vitals/Pain Pain Assessment Pain Assessment: 0-10 Pain Score: 6  Pain Location: chest pain (been ongoing since admission, MD aware), constant Pain Descriptors / Indicators: Discomfort, Constant Pain Intervention(s): Monitored during session    Home Living  Prior Function            PT Goals (current goals can now be found in the care plan section) Acute Rehab PT Goals Patient Stated Goal: Get better Potential to Achieve Goals: Good Progress towards PT goals: Progressing toward goals    Frequency    Min 3X/week      PT Plan  Frequency needs to be updated;Discharge plan needs to be updated    Co-evaluation              AM-PAC PT "6 Clicks" Mobility   Outcome Measure  Help needed turning from your back to your side while in a flat bed without using bedrails?: None Help needed moving from lying on your back to sitting on the side of a flat bed without using bedrails?: None Help needed moving to and from a bed to a chair (including a wheelchair)?: A Little Help needed standing up from a chair using your arms (e.g., wheelchair or bedside chair)?: A Little Help needed to walk in hospital room?: A Little Help needed climbing 3-5 steps with a railing? : A Little 6 Click Score: 20    End of Session Equipment Utilized During Treatment: Gait belt Activity Tolerance: Patient tolerated treatment well Patient left: in bed;with call bell/phone within reach Nurse Communication: Mobility status PT Visit Diagnosis: Unsteadiness on feet (R26.81);Other abnormalities of gait and mobility (R26.89);Ataxic gait (R26.0);Difficulty in walking, not elsewhere classified (R26.2);Other symptoms and signs involving the nervous system (R29.898)     Time: [id number] PT Time Calculation (min) (ACUTE ONLY): 20 min  Charges:  $Therapeutic Activity: 8-22 mins                     Wyona Almas, PT, DPT Acute Rehabilitation Services Office [phone]    Deno Etienne 08/16/2022, 2:09 PM

## 2022-08-16 NOTE — Plan of Care (Signed)
  Problem: Coping: Goal: Ability to adjust to condition or change in health will improve Outcome: Progressing   Problem: Fluid Volume: Goal: Ability to maintain a balanced intake and output will improve Outcome: Progressing   Problem: Health Behavior/Discharge Planning: Goal: Ability to manage health-related needs will improve Outcome: Progressing   Problem: Nutritional: Goal: Maintenance of adequate nutrition will improve Outcome: Progressing   Problem: Skin Integrity: Goal: Risk for impaired skin integrity will decrease Outcome: Progressing   Problem: Clinical Measurements: Goal: Will remain free from infection Outcome: Progressing Goal: Respiratory complications will improve Outcome: Progressing

## 2022-08-16 NOTE — Progress Notes (Signed)
PROGRESS NOTE    Russell Robles  [id number] DOB: 1960-11-17 DOA: 08/10/2022 PCP: Ronnell Freshwater, NP   Brief Narrative:  62 y.o. male with medical history significant of hypertension, hyperlipidemia, diabetes mellitus type 2, anxiety presented with worsening confusion and garbled speech getting worse over the last 2 weeks.  He had a CT of the head without contrast done on 08/04/2022 in the ED which showed no acute abnormality and a chronic left basal ganglier lacunar infarct and he was referred to outpatient neurology.  His symptoms worsened so he presented to the ED.  On presentation, MRI of the brain and MRI of head without contrast did not show any acute abnormalities.  Neurology was consulted.  EEG showed seizures.  He was started on antiepileptics.  He underwent LTM EEG.  Assessment & Plan:   New onset seizures -  MRI and MRA of the brain did not note any acute abnormalities.   EEG showed seizures.  Underwent LTM EEG.  Neurology following.  Currently on antiepileptics as per neurology: Keppra/Vimpat/Depakote/Klonopin.  Neurology recommending outpatient follow-up with neurology. -Seizure/fall precautions.  Possible UTI -Present on admission.  Treated with 3 days of Rocephin and discontinued on 08/14/2022.  Cultures negative so far.  Leukocytosis -Resolved  Diabetes mellitus type 2 with hyperglycemia -A1c 13.1.  Blood sugars improving.  Continue current dose of Lantus.  Continue CBGs with SSI.  Will need long-acting insulin on discharge. -Home regimen of Janumet and Mounjaro on hold for now. -Diabetes coordinator follow-up appreciated.  Trulicity prior authorization has already been obtained.  Intermittent chest pain -Patient has had intermittent right-sided chest pain for the last the last 2 to 3 months.  He apparently had EKG done at his PCPs office as an outpatient.  His EKG earlier during the hospitalization showed no ischemic changes.  Will check 2D echo.  Might need  cardiology evaluation as an outpatient.  Currently chest pain-free.  Use nitroglycerin sublingual if needed.  Hypokalemia -No labs today  Hypertension -Blood pressure improving.  Metoprolol on hold due to bradycardia as well.  If blood pressure becomes an issue, will have to start some other antihypertensive.  Hyperlipidemia -Continue statin  Obesity -Outpatient follow-up  Physical deconditioning -PT recommending CIR placement.  CIR consulted.   DVT prophylaxis: Lovenox Code Status: Full Family Communication: Wife at bedside Disposition Plan: Status is: Inpatient Remains inpatient appropriate because: Of severity of illness.  Need for CIR placement.  Consultants: Neurology  Procedures: EEG/LTM EEG  Antimicrobials: None   Subjective: Patient seen and examined at bedside.  Denies worsening shortness of breath, fever or vomiting.  Wife present at bedside.   Objective: Vitals:   08/15/22 1505 08/15/22 2000 08/15/22 2300 08/16/22 0319  BP: (!) 153/84 134/86 139/80 (!) 142/88  Pulse: 60 (!) 56 62 (!) 58  Resp: 18   18  Temp: 97.6 F (36.4 C) 97.7 F (36.5 C) 97.8 F (36.6 C) (!) 97.5 F (36.4 C)  TempSrc: Oral Axillary Oral Oral  SpO2: 95% 94% 96% 95%  Weight:      Height:        Intake/Output Summary (Last 24 hours) at 08/16/2022 0746 Last data filed at 08/15/2022 1400 Gross per 24 hour  Intake 560 ml  Output 500 ml  Net 60 ml    Filed Weights   08/13/22 0635  Weight: 98.2 kg    Examination:  General: On room air.  No distress.   Respiratory: Bilateral decreased breath sounds at bases with scattered crackles CVS:  Mild intermittent bradycardia present; S1 and S2 heard abdominal: Soft, obese, nontender, distended slightly; no organomegaly; normal bowel sounds heard  extremities: No clubbing; mild lower extremity edema present    Data Reviewed: I have personally reviewed following labs and imaging studies  CBC: Recent Labs  Lab 08/10/22 2108  08/10/22 2135 08/12/22 0749 08/13/22 0435  WBC 11.0*  --  8.6 7.1  NEUTROABS 5.3  --   --  3.0  HGB 16.1 16.7 13.4 12.3*  HCT 46.1 49.0 38.1* 36.3*  MCV 84.3  --  83.9 85.2  PLT 283  --  203 0000000    Basic Metabolic Panel: Recent Labs  Lab 08/10/22 2108 08/10/22 2135 08/11/22 1420 08/13/22 0435 08/14/22 0344 08/15/22 0410  NA 134* 134* 134* 135 138 137  K 3.8 3.6 3.8 3.3* 3.4* 3.4*  CL 98 97* 102 104 106 104  CO2 25  --  '23 23 23 24  '$ GLUCOSE 407* 390* 346* 211* 152* 116*  BUN '18 21 12 10 '$ 7* 7*  CREATININE 1.17 1.00 1.06 0.88 0.86 0.83  CALCIUM 10.0  --  8.8* 8.2* 8.7* 8.8*  MG  --   --  1.9 1.8 2.0 1.9    GFR: Estimated Creatinine Clearance: 106.2 mL/min (by C-G formula based on SCr of 0.83 mg/dL). Liver Function Tests: Recent Labs  Lab 08/10/22 2108 08/13/22 0435  AST 26 19  ALT 32 21  ALKPHOS 73 53  BILITOT 0.8 0.5  PROT 7.6 5.4*  ALBUMIN 4.2 2.8*    No results for input(s): "LIPASE", "AMYLASE" in the last 168 hours. Recent Labs  Lab 08/15/22 0935  AMMONIA 34   Coagulation Profile: No results for input(s): "INR", "PROTIME" in the last 168 hours. Cardiac Enzymes: Recent Labs  Lab 08/11/22 1420  CKTOTAL 38*    BNP (last 3 results) No results for input(s): "PROBNP" in the last 8760 hours. HbA1C: No results for input(s): "HGBA1C" in the last 72 hours.  CBG: Recent Labs  Lab 08/15/22 0616 08/15/22 1230 08/15/22 1636 08/15/22 2126 08/16/22 0617  GLUCAP 129* 208* 159* 196* 159*    Lipid Profile: No results for input(s): "CHOL", "HDL", "LDLCALC", "TRIG", "CHOLHDL", "LDLDIRECT" in the last 72 hours. Thyroid Function Tests: No results for input(s): "TSH", "T4TOTAL", "FREET4", "T3FREE", "THYROIDAB" in the last 72 hours. Anemia Panel: No results for input(s): "VITAMINB12", "FOLATE", "FERRITIN", "TIBC", "IRON", "RETICCTPCT" in the last 72 hours. Sepsis Labs: No results for input(s): "PROCALCITON", "LATICACIDVEN" in the last 168 hours.  Recent  Results (from the past 240 hour(s))  Urine Culture (for pregnant, neutropenic or urologic patients or patients with an indwelling urinary catheter)     Status: None   Collection Time: 08/12/22 11:44 AM   Specimen: Urine, Clean Catch  Result Value Ref Range Status   Specimen Description URINE, CLEAN CATCH  Final   Special Requests NONE  Final   Culture   Final    NO GROWTH Performed at [facility], [address], [place], [place] [address]    Report Status 08/13/2022 FINAL  Final         Radiology Studies: MR BRAIN WO CONTRAST  Result Date: 08/15/2022 CLINICAL DATA:  Seizure disorder. Left parieto-occipital seizure in setting of hypoglycemia. EXAM: MRI HEAD WITHOUT CONTRAST TECHNIQUE: Multiplanar, multiecho pulse sequences of the brain and surrounding structures were obtained without intravenous contrast. COMPARISON:  MRI brain 08/10/2022.  CT head 08/04/2022. FINDINGS: Brain: No acute infarct or hemorrhage. No mass or midline shift. No hydrocephalus or  extra-axial collection. Basilar cisterns are patent. Symmetric size and signal of the hippocampi. No evidence of cortical dysgenesis. Vascular: Normal flow voids. Skull and upper cervical spine: Normal marrow signal. Sinuses/Orbits: Unremarkable. Other: None. IMPRESSION: No acute intracranial abnormality. No structural lesion identified. Electronically Signed   By: Emmit Alexanders M.D.   On: 08/15/2022 14:38        Scheduled Meds:  atorvastatin  40 mg Oral Daily   clonazePAM  1 mg Oral TID   divalproex  500 mg Oral BID   enoxaparin (LOVENOX) injection  40 mg Subcutaneous Q24H   insulin aspart  0-15 Units Subcutaneous TID WC   insulin aspart  0-5 Units Subcutaneous QHS   insulin glargine-yfgn  25 Units Subcutaneous Daily   lacosamide  100 mg Oral BID   levETIRAcetam  2,000 mg Oral BID   pantoprazole  40 mg Oral Daily   senna-docusate  1 tablet Oral BID   Continuous Infusions:          Aline August, MD [facility] 08/16/2022, 7:46 AM

## 2022-08-16 NOTE — Progress Notes (Signed)
Inpatient Diabetes Program Recommendations  AACE/ADA: New Consensus Statement on Inpatient Glycemic Control (2015)  Target Ranges:  Prepandial:   less than 140 mg/dL      Peak postprandial:   less than 180 mg/dL (1-2 hours)      Critically ill patients:  140 - 180 mg/dL   Lab Results  Component Value Date   GLUCAP 220 (H) 08/16/2022   HGBA1C 13.1 (H) 08/10/2022    Review of Glycemic Control  Latest Reference Range & Units 08/14/22 17:02 08/14/22 21:45 08/15/22 06:16 08/15/22 12:30 08/15/22 16:36 08/15/22 21:26 08/16/22 06:17 08/16/22 11:39  Glucose-Capillary 70 - 99 mg/dL 162 (H) 340 (H) 129 (H) 208 (H) 159 (H) 196 (H) 159 (H) 220 (H)   Diabetes history: DM Outpatient Diabetes medications:  Janumet 50-500 mg bid Mounjaro 5 mg weekly (08/10/22-started) Current orders for Inpatient glycemic control:  Novolog 0-15 tid with meals and HS Semglee 25 units daily Inpatient Diabetes Program Recommendations:    Spoke with patient and wife again and reviewed CGM data with them.  We discussed how certain foods affect blood sugars.  Gave them handouts on planning healthy meals as well.  They state that they do not want to go back to current PCP and have concerns regarding inability to get medications covered and pre-authorizations done. Discussed importance of avoiding low blood sugars as well as high blood sugars.  Also discussed ways to bring blood sugars down like drinking water, exercise, and letting MD know if blood sugars are consistently high.   Will follow.   Thanks,  Adah Perl, RN, BC-ADM Inpatient Diabetes Coordinator Pager [phone]  (8a-5p)

## 2022-08-17 ENCOUNTER — Inpatient Hospital Stay (HOSPITAL_COMMUNITY): Payer: No Typology Code available for payment source

## 2022-08-17 ENCOUNTER — Telehealth: Payer: Self-pay

## 2022-08-17 ENCOUNTER — Other Ambulatory Visit: Payer: Self-pay | Admitting: Nurse Practitioner

## 2022-08-17 ENCOUNTER — Other Ambulatory Visit (HOSPITAL_COMMUNITY): Payer: Self-pay

## 2022-08-17 ENCOUNTER — Ambulatory Visit: Payer: BC Managed Care – PPO | Admitting: Nurse Practitioner

## 2022-08-17 DIAGNOSIS — R569 Unspecified convulsions: Secondary | ICD-10-CM | POA: Diagnosis not present

## 2022-08-17 DIAGNOSIS — E1159 Type 2 diabetes mellitus with other circulatory complications: Secondary | ICD-10-CM | POA: Diagnosis not present

## 2022-08-17 DIAGNOSIS — R079 Chest pain, unspecified: Secondary | ICD-10-CM | POA: Diagnosis not present

## 2022-08-17 DIAGNOSIS — E1169 Type 2 diabetes mellitus with other specified complication: Secondary | ICD-10-CM | POA: Diagnosis not present

## 2022-08-17 DIAGNOSIS — E1165 Type 2 diabetes mellitus with hyperglycemia: Secondary | ICD-10-CM | POA: Diagnosis not present

## 2022-08-17 DIAGNOSIS — E118 Type 2 diabetes mellitus with unspecified complications: Secondary | ICD-10-CM

## 2022-08-17 LAB — BASIC METABOLIC PANEL
Anion gap: 10 (ref 5–15)
BUN: 9 mg/dL (ref 8–23)
CO2: 27 mmol/L (ref 22–32)
Calcium: 9.3 mg/dL (ref 8.9–10.3)
Chloride: 102 mmol/L (ref 98–111)
Creatinine, Ser: 1 mg/dL (ref 0.61–1.24)
GFR, Estimated: >60 mL/min (ref >60)
Glucose, Bld: 131 mg/dL — ABNORMAL HIGH (ref 70–99)
Potassium: 3.9 mmol/L (ref 3.5–5.1)
Sodium: 139 mmol/L (ref 135–145)

## 2022-08-17 LAB — ECHOCARDIOGRAM COMPLETE
AR max vel: 2.5 cm2
AV Area VTI: 2.53 cm2
AV Area mean vel: 2.67 cm2
AV Mean grad: 6 mmHg
AV Peak grad: 10.9 mmHg
Ao pk vel: 1.65 m/s
Area-P 1/2: 2.32 cm2
Height: 68 in
S' Lateral: 3.7 cm
Weight: 3463.87 oz

## 2022-08-17 LAB — GLUCOSE, CAPILLARY
Glucose-Capillary: 136 mg/dL — ABNORMAL HIGH (ref 70–99)
Glucose-Capillary: 223 mg/dL — ABNORMAL HIGH (ref 70–99)

## 2022-08-17 LAB — MAGNESIUM: Magnesium: 2 mg/dL (ref 1.7–2.4)

## 2022-08-17 MED ORDER — CLONAZEPAM 1 MG PO TABS
1.0000 mg | ORAL_TABLET | Freq: Three times a day (TID) | ORAL | 0 refills | Status: DC
  Filled 2022-08-17: qty 30, 10d supply, fill #0

## 2022-08-17 MED ORDER — PERFLUTREN LIPID MICROSPHERE
1.0000 mL | INTRAVENOUS | Status: AC | PRN
  Administered 2022-08-17: 6 mL via INTRAVENOUS

## 2022-08-17 MED ORDER — LEVETIRACETAM 1000 MG PO TABS
2000.0000 mg | ORAL_TABLET | Freq: Two times a day (BID) | ORAL | 0 refills | Status: DC
  Filled 2022-08-17: qty 120, 30d supply, fill #0

## 2022-08-17 MED ORDER — LANTUS SOLOSTAR 100 UNIT/ML ~~LOC~~ SOPN
25.0000 [IU] | PEN_INJECTOR | Freq: Every day | SUBCUTANEOUS | 0 refills | Status: DC
  Filled 2022-08-17: qty 15, 60d supply, fill #0

## 2022-08-17 MED ORDER — BASAGLAR KWIKPEN 100 UNIT/ML ~~LOC~~ SOPN
25.0000 [IU] | PEN_INJECTOR | Freq: Every day | SUBCUTANEOUS | 0 refills | Status: DC
  Filled 2022-08-17: qty 6, 24d supply, fill #0

## 2022-08-17 MED ORDER — TRULICITY 0.75 MG/0.5ML ~~LOC~~ SOAJ
0.7500 mg | SUBCUTANEOUS | 0 refills | Status: DC
  Filled 2022-08-17: qty 2, 28d supply, fill #0

## 2022-08-17 MED ORDER — ACCU-CHEK GUIDE W/DEVICE KIT
1.0000 | PACK | Freq: Three times a day (TID) | 0 refills | Status: DC
  Filled 2022-08-17: qty 1, 1d supply, fill #0

## 2022-08-17 MED ORDER — ACCU-CHEK SOFTCLIX LANCETS MISC
1.0000 | Freq: Three times a day (TID) | 0 refills | Status: DC
  Filled 2022-08-17: qty 100, 30d supply, fill #0

## 2022-08-17 MED ORDER — DEXCOM G7 SENSOR MISC
0 refills | Status: DC
  Filled 2022-08-17: qty 3, fill #0

## 2022-08-17 MED ORDER — LACOSAMIDE 100 MG PO TABS
100.0000 mg | ORAL_TABLET | Freq: Two times a day (BID) | ORAL | 0 refills | Status: DC
  Filled 2022-08-17: qty 180, 90d supply, fill #0

## 2022-08-17 MED ORDER — NITROGLYCERIN 0.4 MG SL SUBL
0.4000 mg | SUBLINGUAL_TABLET | SUBLINGUAL | 0 refills | Status: DC | PRN
  Filled 2022-08-17: qty 25, 7d supply, fill #0

## 2022-08-17 MED ORDER — CLONAZEPAM 1 MG PO TABS
1.0000 mg | ORAL_TABLET | Freq: Three times a day (TID) | ORAL | 0 refills | Status: DC
  Filled 2022-08-17: qty 90, 30d supply, fill #0

## 2022-08-17 MED ORDER — DIVALPROEX SODIUM 500 MG PO DR TAB
500.0000 mg | DELAYED_RELEASE_TABLET | Freq: Two times a day (BID) | ORAL | 0 refills | Status: DC
  Filled 2022-08-17: qty 60, 30d supply, fill #0

## 2022-08-17 MED ORDER — INSULIN PEN NEEDLE 31G X 8 MM MISC
0 refills | Status: DC
  Filled 2022-08-17: qty 100, 90d supply, fill #0

## 2022-08-17 MED ORDER — LANCET DEVICE MISC
1.0000 | Freq: Three times a day (TID) | 0 refills | Status: DC
  Filled 2022-08-17: qty 1, 30d supply, fill #0

## 2022-08-17 MED ORDER — GLUCOSE BLOOD VI STRP
1.0000 | ORAL_STRIP | Freq: Three times a day (TID) | 0 refills | Status: DC
  Filled 2022-08-17: qty 100, 34d supply, fill #0

## 2022-08-17 NOTE — Progress Notes (Signed)
Unit CM spoke with pt and wife, HHPT/OT set up. Pt and wife informed and AVS reprinted with information on it for [facility].  Pt d/c'd at this time.

## 2022-08-17 NOTE — Progress Notes (Signed)
At bedside shift report, pt asks "is the ECHO ordered because of the chest pain I have?" Per day RN pt has not c/o CP all day.  Pt says he has had CP 6/10 constantly all day and constantly "for the past 2 to 3 months" and is unchanged today. Pain starts on right chest, goes along right ribs and to right back.  Slightly "improves when I inhale and I feel it more when I exhale". Reports he saw PCP for it a couple months ago and was told it "is musculoskeletal" but has not tried any tylenol, ibuprofen or other meds for it.   EKG obtained and discussed above with night MD. Troponin lab negative.  Tylenol given and pain decreased to 1/10. MD notified.

## 2022-08-17 NOTE — Telephone Encounter (Signed)
Please let her know that I have placed the referral to [facility] endocrinology.  Thanks  -HB

## 2022-08-17 NOTE — Plan of Care (Signed)

## 2022-08-17 NOTE — Progress Notes (Signed)
Physical Therapy Treatment Patient Details Name: Russell Robles MRN: [id number] DOB: Sep 09, 1960 Today's Date: 08/17/2022   History of Present Illness 62 y.o. male admitted 2/21 after presenting with garbled speech and confusion. New onset seizures, possible UTI. PMHx: hypertension, hyperlipidemia, diabetes mellitus type 2, anxiety    PT Comments    Pt is continuing to demonstrate improved stability with mobility, but remains at risk for falls. This is supported by his DGI score of 17/24 this date. Educated pt and wife on his risk for falls and ways to improve his balance and safety at home. Administered gait belt. Will continue to follow acutely. Current recommendations remain appropriate.      Recommendations for follow up therapy are one component of a multi-disciplinary discharge planning process, led by the attending physician.  Recommendations may be updated based on patient status, additional functional criteria and insurance authorization.  Follow Up Recommendations  Outpatient PT     Assistance Recommended at Discharge Frequent or constant Supervision/Assistance  Patient can return home with the following A little help with walking and/or transfers;A little help with bathing/dressing/bathroom;Assistance with cooking/housework;Direct supervision/assist for medications management;Direct supervision/assist for financial management;Assist for transportation;Help with stairs or ramp for entrance   Equipment Recommendations  None recommended by PT    Recommendations for Other Services       Precautions / Restrictions Precautions Precautions: Fall Precaution Comments: seizures Restrictions Weight Bearing Restrictions: No     Mobility  Bed Mobility Overal bed mobility: Modified Independent             General bed mobility comments: No assistance needed, HOB elevated    Transfers Overall transfer level: Needs assistance Equipment used: None Transfers: Sit  to/from Stand Sit to Stand: Supervision           General transfer comment: Supervision for safety    Ambulation/Gait Ambulation/Gait assistance: Supervision, Min guard Gait Distance (Feet): 240 Feet (x2 bouts of ~240 ft > ~30 ft) Assistive device: None Gait Pattern/deviations: Step-through pattern, Decreased stride length Gait velocity: decreased Gait velocity interpretation: 1.31 - 2.62 ft/sec, indicative of limited community ambulator   General Gait Details: Pt with minor stagger laterally to the L initially, but able to recover without assistance, min guard for safety. Stability improved with distance, progressing to supervision level. Noted inconsistent step lengths and stance width due to instability.   Stairs Stairs: Yes Stairs assistance: Min guard, Min assist Stair Management: No rails, Alternating pattern, Step to pattern, Forwards Number of Stairs: 5 (x2 standard height, x3 short height) General stair comments: Pt ascends with reciprocal step, having x1 LOB ascending second standard height step needing minA to recover. Instability noted descending with step-to pattern, but no LOB, min guard for safety.   Wheelchair Mobility    Modified Rankin (Stroke Patients Only)       Balance Overall balance assessment: Needs assistance Sitting-balance support: No upper extremity supported, Feet supported Sitting balance-Leahy Scale: Good     Standing balance support: No upper extremity supported, During functional activity Standing balance-Leahy Scale: Fair Standing balance comment: Instability noted when ambulating without UE support                 Standardized Balance Assessment Standardized Balance Assessment : Dynamic Gait Index   Dynamic Gait Index Level Surface: Mild Impairment Change in Gait Speed: Mild Impairment Gait with Horizontal Head Turns: Mild Impairment Gait with Vertical Head Turns: Mild Impairment Gait and Pivot Turn: Normal Step Over  Obstacle: Mild Impairment Step Around Obstacles:  Mild Impairment Steps: Mild Impairment Total Score: 17      Cognition Arousal/Alertness: Awake/alert Behavior During Therapy: WFL for tasks assessed/performed Overall Cognitive Status: Within Functional Limits for tasks assessed                                 General Comments: Wife confirmed pt is at his baseline.        Exercises      General Comments General comments (skin integrity, edema, etc.): educated pt and wife on recommendations for guarding/supervision with all standing mobility due to pt's risk for falls (administered gait belt) and to use handrails on stairs, but if pt has to go to work and leave pt unsupervised for periods of time to have necessities proximal to him and have him have a phone on him at all times and potentially use a home video camera to check on him. Educated them to monitor his management of meds and finances and cooking initially. Educated them to mobilize frequently and ways to improve standing balance with guarding for pt safety      Pertinent Vitals/Pain Pain Assessment Pain Assessment: Faces Faces Pain Scale: No hurt Pain Intervention(s): Monitored during session    Home Living                          Prior Function            PT Goals (current goals can now be found in the care plan section) Acute Rehab PT Goals Patient Stated Goal: to go back to work PT Goal Formulation: With patient/family Time For Goal Achievement: 08/26/22 Potential to Achieve Goals: Good Progress towards PT goals: Progressing toward goals    Frequency    Min 3X/week      PT Plan Current plan remains appropriate    Co-evaluation              AM-PAC PT "6 Clicks" Mobility   Outcome Measure  Help needed turning from your back to your side while in a flat bed without using bedrails?: None Help needed moving from lying on your back to sitting on the side of a flat bed  without using bedrails?: None Help needed moving to and from a bed to a chair (including a wheelchair)?: A Little Help needed standing up from a chair using your arms (e.g., wheelchair or bedside chair)?: A Little Help needed to walk in hospital room?: A Little Help needed climbing 3-5 steps with a railing? : A Little 6 Click Score: 20    End of Session Equipment Utilized During Treatment: Gait belt Activity Tolerance: Patient tolerated treatment well Patient left: in bed;with call bell/phone within reach;with family/visitor present   PT Visit Diagnosis: Unsteadiness on feet (R26.81);Other abnormalities of gait and mobility (R26.89);Ataxic gait (R26.0);Difficulty in walking, not elsewhere classified (R26.2);Other symptoms and signs involving the nervous system [id number])     Time: [id number] PT Time Calculation (min) (ACUTE ONLY): 14 min  Charges:  $Therapeutic Activity: 8-22 mins                     Moishe Spice, PT, DPT Acute Rehabilitation Services  Office: [place] 08/17/2022, 10:41 AM

## 2022-08-17 NOTE — Telephone Encounter (Signed)
Called wife to let her know that the referral has been placed.

## 2022-08-17 NOTE — Progress Notes (Signed)
Inpatient Diabetes Program Recommendations  AACE/ADA: New Consensus Statement on Inpatient Glycemic Control (2015)  Target Ranges:  Prepandial:   less than 140 mg/dL      Peak postprandial:   less than 180 mg/dL (1-2 hours)      Critically ill patients:  140 - 180 mg/dL   Lab Results  Component Value Date   GLUCAP 136 (H) 08/17/2022   HGBA1C 13.1 (H) 08/10/2022    Review of Glycemic Control  Latest Reference Range & Units 08/15/22 21:26 08/16/22 06:17 08/16/22 11:39 08/16/22 16:01 08/16/22 22:17 08/17/22 06:20  Glucose-Capillary 70 - 99 mg/dL 196 (H) 159 (H) 220 (H) 167 (H) 262 (H) 136 (H)   Diabetes history: DM 2 Outpatient Diabetes medications:  Janumet 50-500 mg bid Mounjaro 5 mg weekly (08/10/22-started) Current orders for Inpatient glycemic control:  Novolog 0-15 tid with meals and HS Semglee 25 units daily Inpatient Diabetes Program Recommendations:    Spoke with patient and wife.  They are going home today.  Discussed D/C medications and also importance of f/u.  MD has ordered sensors for home use.  Gave them pamphlet with numbers for issues or questions.  Will get medications filled by [facility] pharmacy.  They plan to get referral for endocrinology as well.   Thanks,  Adah Perl, RN, BC-ADM Inpatient Diabetes Coordinator Pager [phone]  (8a-5p)

## 2022-08-17 NOTE — Discharge Summary (Addendum)
Physician Discharge Summary  Russell Robles [id number] DOB: 09-19-1960 DOA: 08/10/2022  PCP: Ronnell Freshwater, NP  Admit date: 08/10/2022 Discharge date: 08/17/2022  Admitted From: Home Disposition: Home  Recommendations for Outpatient Follow-up:  Follow up with PCP in 1 week with repeat CBC/BMP Outpatient follow-up with neurology. Recommend outpatient evaluation and follow-up by cardiology Follow up in ED if symptoms worsen or new appear   Home Health: No.  Will need outpatient PT/OT Equipment/Devices: None  Discharge Condition: Stable CODE STATUS: Full Diet recommendation: Heart healthy/carb modified  Brief/Interim Summary: 62 y.o. male with medical history significant of hypertension, hyperlipidemia, diabetes mellitus type 2, anxiety presented with worsening confusion and garbled speech getting worse over the last 2 weeks.  He had a CT of the head without contrast done on 08/04/2022 in the ED which showed no acute abnormality and a chronic left basal ganglier lacunar infarct and he was referred to outpatient neurology.  His symptoms worsened so he presented to the ED.  On presentation, MRI of the brain and MRI of head without contrast did not show any acute abnormalities.  Neurology was consulted.  EEG showed seizures.  He was started on antiepileptics.  He underwent LTM EEG.  Subsequently, LTM EEG has been discontinued and neurology has signed off and cleared him for discharge on current antiepileptics.  PT initially recommended CIR but is now recommending outpatient PT.  He will be discharged home today with close outpatient follow-up with PCP and neurology.  Discharge Diagnoses:   New onset seizures -  MRI and MRA of the brain did not note any acute abnormalities.   EEG showed seizures.  Underwent LTM EEG.  Neurology following.  Currently on antiepileptics as per neurology: Keppra/Vimpat/Depakote/Klonopin.  Neurology recommending outpatient follow-up with neurology which is  scheduled for next week.  Neurology has sent the prescriptions for the same. -Seizure/fall precautions. -Patient needs to avoid driving till at least 6 months and should return to driving only once cleared by PCP and/or neurology -Discharge home today.   Possible UTI -Present on admission.  Treated with 3 days of Rocephin and discontinued on 08/14/2022.  Cultures negative so far.   Leukocytosis -Resolved   Diabetes mellitus type 2 with hyperglycemia -A1c 13.1.  Blood sugars improving.  -Home regimen of Janumet and Mounjaro on hold for now. -Diabetes coordinator follow-up appreciated.  Trulicity prior authorization has already been obtained. -On discharge: Patient will take 25 units of long-acting insulin along with oral Janumet and once weekly Trulicity.  Darcel Bayley can be discontinued.  Carb modified diet.  Outpatient follow-up with PCP.   Intermittent chest pain -Patient has had intermittent right-sided chest pain for the last the last 2 to 3 months.  He apparently had EKG done at his PCPs office as an outpatient.  His EKG earlier during the hospitalization showed no ischemic changes.  Currently chest pain-free.  Use nitroglycerin sublingual if needed. -2D echo to be done today: Result can be followed up by PCP as an outpatient.  Recommend outpatient cardiology evaluation and follow-up with   hypokalemia -Resolved  Hypertension -Blood pressure improving.  Metoprolol on hold due to bradycardia as well.  Hydrochlorothiazide to remain on hold as well on discharge.  Resume amlodipine and valsartan.    Hyperlipidemia -Continue statin   Obesity -Outpatient follow-up   Physical deconditioning -PT initially recommended CIR but is now recommending outpatient PT.      Discharge Instructions  Discharge Instructions     Ambulatory referral to Cardiology   Complete  by: As directed    Ambulatory referral to Neurology   Complete by: As directed    An appointment is requested in  approximately: 4 weeks   Ambulatory referral to Occupational Therapy   Complete by: As directed    Ambulatory referral to Physical Therapy   Complete by: As directed    Diet - low sodium heart healthy   Complete by: As directed    Diet Carb Modified   Complete by: As directed    Increase activity slowly   Complete by: As directed       Allergies as of 08/17/2022   No Known Allergies      Medication List     STOP taking these medications    hydrochlorothiazide 12.5 MG tablet Commonly known as: HYDRODIURIL   meclizine 12.5 MG tablet Commonly known as: ANTIVERT   metoprolol succinate 50 MG 24 hr tablet Commonly known as: TOPROL-XL   tirzepatide 5 MG/0.5ML Pen Commonly known as: MOUNJARO   triamcinolone ointment 0.1 % Commonly known as: KENALOG   valACYclovir 1000 MG tablet Commonly known as: VALTREX       TAKE these medications    Accu-Chek Guide w/Device Kit Use 1 each in the morning, at noon, and at bedtime.   Accu-Chek Softclix Lancets lancets Use 1 each in the morning, at noon, and at bedtime.   amLODipine-valsartan 5-160 MG tablet Commonly known as: EXFORGE TAKE 1 TABLET BY MOUTH DAILY. (TAKE WITH HCTZ 12.5 MG TABLET) What changed:  how much to take how to take this when to take this   clonazePAM 1 MG tablet Commonly known as: KLONOPIN Take 1 tablet (1 mg total) by mouth 3 (three) times daily.   Dexcom G7 Sensor Misc Use as directed   divalproex 500 MG DR tablet Commonly known as: DEPAKOTE Take 1 tablet (500 mg total) by mouth 2 (two) times daily.   glucose blood test strip Use 1 each in the morning, at noon, and at bedtime.   Insulin Pen Needle 31G X 8 MM Misc Use with Insulin Glargine to inject 25 units daily   Janumet 50-500 MG tablet Generic drug: sitaGLIPtin-metformin Take 1 tablet by mouth 2 (two) times daily with a meal.   Lacosamide 100 MG Tabs Take 1 tablet (100 mg total) by mouth 2 (two) times daily.   Lancet Device  Misc Use 1  in the morning, at noon, and at bedtime.   Lantus SoloStar 100 UNIT/ML Solostar Pen Generic drug: insulin glargine Inject 25 Units into the skin daily.   levETIRAcetam 1000 MG tablet Commonly known as: KEPPRA Take 2 tablets (2,000 mg total) by mouth 2 (two) times daily.   nitroGLYCERIN 0.4 MG SL tablet Commonly known as: NITROSTAT Place 1 tablet (0.4 mg total) under the tongue every 5 (five) minutes as needed for chest pain. If a third tablet is needed please call 911 or go to the emergency department.   simvastatin 80 MG tablet Commonly known as: ZOCOR Take 1 tablet (80 mg total) by mouth at bedtime.   Trulicity [id number] Sopn Generic drug: Dulaglutide Inject 0.75 mg into the skin once a week.   VITAMIN B12 PO Take 1 tablet by mouth daily.   Vitamin D3 125 MCG (5000 UT) Tabs Generic drug: Cholecalciferol 5,000 IU OTC vitamin D3 daily. What changed:  how much to take how to take this when to take this          [facility]. Schedule an appointment as soon as possible for a visit in 1 week(s).   Specialty: Rehabilitation Contact information: [address] [place] [id number] [place] [id number] [phone]        Ronnell Freshwater, NP. Schedule an appointment as soon as possible for a visit in 1 week(s).   Specialty: Family Medicine Contact information: [place] [address] [phone]                No Known Allergies  Consultations: Neurology   Procedures/Studies: MR BRAIN WO CONTRAST  Result Date: 08/15/2022 CLINICAL DATA:  Seizure disorder. Left parieto-occipital seizure in setting of hypoglycemia. EXAM: MRI HEAD WITHOUT CONTRAST TECHNIQUE: Multiplanar, multiecho pulse sequences of the brain and surrounding structures were obtained without intravenous contrast. COMPARISON:  MRI brain 08/10/2022.  CT head 08/04/2022. FINDINGS:  Brain: No acute infarct or hemorrhage. No mass or midline shift. No hydrocephalus or extra-axial collection. Basilar cisterns are patent. Symmetric size and signal of the hippocampi. No evidence of cortical dysgenesis. Vascular: Normal flow voids. Skull and upper cervical spine: Normal marrow signal. Sinuses/Orbits: Unremarkable. Other: None. IMPRESSION: No acute intracranial abnormality. No structural lesion identified. Electronically Signed   By: Emmit Alexanders M.D.   On: 08/15/2022 14:38   Overnight EEG with video  Result Date: 08/11/2022 Lora Havens, MD     08/12/2022  9:05 AM Patient Name: Russell Robles MRN: [id number] Epilepsy Attending: Lora Havens Referring Physician/Provider: Kerney Elbe, MD Duration: 08/11/2022 0701 to 08/12/2022 0701 Patient history: 61 year old male who has been having altered mentation and episodes of staring off his face for less than 1 second. EEG to evaluate for seizure. Level of alertness: Awake, asleep AEDs during EEG study: LEV, LCM, PHT, Perampanel Technical aspects: This EEG study was done with scalp electrodes positioned according to the 10-20 International system of electrode placement. Electrical activity was reviewed with band pass filter of 1-'70Hz'$ , sensitivity of 7 uV/mm, display speed of 53m/sec with a '60Hz'$  notched filter applied as appropriate. EEG data were recorded continuously and digitally stored.  Video monitoring was available and reviewed as appropriate. Description: The posterior dominant rhythm consists of 8 Hz activity of moderate voltage (25-35 uV) seen predominantly in posterior head regions, symmetric and reactive to eye opening and eye closing. Sleep was characterized by vertex waves, sleep spindles (12 to 14 Hz), maximal frontocentral region.  Seizures were noted arising from left parieto-occipital region.  During seizure, patient was noted to be staring off, unable to respond or follow commands, at times with right upper extremity  stiffness.  At the onset of seizure, EEG showed low amplitude 15 to 18 Hz beta activity in the right occipital region which then involved all of left hemisphere followed by right hemisphere.  EEG then evolved into sharply contoured 3 to 5 Hz theta-delta slowing.  Average 3 seizures were noted per hour, lasting about 2-4 minutes each. Hyperventilation and photic stimulation were not performed.   ABNORMALITY -Focal seizures with impaired awareness, left parieto-occipital region IMPRESSION: This study showed focal seizures arising from parieto-occipital region, average 3 seizures per hour, lasting about 2 to 4 minutes each. During seizure, patient was noted to be staring off, unable to respond or follow commands, at times with right upper extremity stiffness. PLora Havens  MR BRAIN WO CONTRAST  Result Date: 08/10/2022 CLINICAL DATA:  Blurry vision EXAM: MRI HEAD WITHOUT CONTRAST MRA HEAD WITHOUT CONTRAST TECHNIQUE: Multiplanar, multi-echo pulse sequences of the brain  and surrounding structures were acquired without intravenous contrast. Angiographic images of the Circle of Willis were acquired using MRA technique without intravenous contrast. COMPARISON:  None Available. FINDINGS: MRI HEAD FINDINGS Only diffusion-weighted imaging was able to be performed. There is no acute infarct or extra-axial collection. MRA HEAD FINDINGS POSTERIOR CIRCULATION: --Vertebral arteries: Normal --Inferior cerebellar arteries: Normal. --Basilar artery: Normal. --Superior cerebellar arteries: Normal. --Posterior cerebral arteries: Normal. ANTERIOR CIRCULATION: --Intracranial internal carotid arteries: Normal. --Anterior cerebral arteries (ACA): Normal. --Middle cerebral arteries (MCA): Normal. IMPRESSION: 1. No acute infarct. Normal diffusion-weighted imaging of the brain. 2. Normal intracranial MRA. Electronically Signed   By: Ulyses Jarred M.D.   On: 08/10/2022 22:39   MR ANGIO HEAD WO CONTRAST  Result Date:  08/10/2022 CLINICAL DATA:  Blurry vision EXAM: MRI HEAD WITHOUT CONTRAST MRA HEAD WITHOUT CONTRAST TECHNIQUE: Multiplanar, multi-echo pulse sequences of the brain and surrounding structures were acquired without intravenous contrast. Angiographic images of the Circle of Willis were acquired using MRA technique without intravenous contrast. COMPARISON:  None Available. FINDINGS: MRI HEAD FINDINGS Only diffusion-weighted imaging was able to be performed. There is no acute infarct or extra-axial collection. MRA HEAD FINDINGS POSTERIOR CIRCULATION: --Vertebral arteries: Normal --Inferior cerebellar arteries: Normal. --Basilar artery: Normal. --Superior cerebellar arteries: Normal. --Posterior cerebral arteries: Normal. ANTERIOR CIRCULATION: --Intracranial internal carotid arteries: Normal. --Anterior cerebral arteries (ACA): Normal. --Middle cerebral arteries (MCA): Normal. IMPRESSION: 1. No acute infarct. Normal diffusion-weighted imaging of the brain. 2. Normal intracranial MRA. Electronically Signed   By: Ulyses Jarred M.D.   On: 08/10/2022 22:39   DG Chest 2 View  Result Date: 08/04/2022 CLINICAL DATA:  Chest pain and blurred vision with dizziness that began 3 days ago. EXAM: CHEST - 2 VIEW COMPARISON:  None available. FINDINGS: EKG leads project over the chest. Trachea midline. Cardiomediastinal contours and hilar structures are normal. Lungs are clear. No pneumothorax. No pleural effusion. On limited assessment no acute skeletal process IMPRESSION: No active cardiopulmonary disease. Electronically Signed   By: Zetta Bills M.D.   On: 08/04/2022 17:25   CT HEAD WO CONTRAST  Result Date: 08/04/2022 CLINICAL DATA:  Dizziness and blurred vision x3 days. EXAM: CT HEAD WITHOUT CONTRAST TECHNIQUE: Contiguous axial images were obtained from the base of the skull through the vertex without intravenous contrast. RADIATION DOSE REDUCTION: This exam was performed according to the departmental dose-optimization  program which includes automated exposure control, adjustment of the mA and/or kV according to patient size and/or use of iterative reconstruction technique. COMPARISON:  None Available. FINDINGS: Brain: There is mild cerebral atrophy with widening of the extra-axial spaces and ventricular dilatation. There are areas of decreased attenuation within the white matter tracts of the supratentorial brain, consistent with microvascular disease changes. A chronic left basal ganglia lacunar infarct is noted. Vascular: No hyperdense vessel or unexpected calcification. Skull: Normal. Negative for fracture or focal lesion. Sinuses/Orbits: There is mild left ethmoid sinus mucosal thickening. Other: None. IMPRESSION: 1. No acute intracranial abnormality. 2. Chronic left basal ganglia lacunar infarct. 3. Generalized cerebral atrophy with chronic microvascular disease changes of the supratentorial brain. 4. Mild left ethmoid sinus disease. Electronically Signed   By: Virgina Norfolk M.D.   On: 08/04/2022 16:12      Subjective: Patient seen and examined at bedside.  Denies worsening fever, seizures, vomiting.  Wife present at bedside.  Patient feels okay to go home today and wife agrees with the plan.  Discharge Exam: Vitals:   08/17/22 0343 08/17/22 0738  BP: (!) 142/83 123/71  Pulse: (!) 50 (!) 50  Resp: 16 18  Temp: 97.6 F (36.4 C) 97.7 F (36.5 C)  SpO2: 95% 96%    General: Pt is alert, awake, not in acute distress.  Looks chronically ill and deconditioned.  Slow to respond but answers some questions appropriately. Cardiovascular: Mild bradycardia present, S1/S2 + Respiratory: bilateral decreased breath sounds at bases with some scattered crackles Abdominal: Soft, obese, NT, ND, bowel sounds + Extremities: Trace lower extremity edema; no cyanosis    The results of significant diagnostics from this hospitalization (including imaging, microbiology, ancillary and laboratory) are listed below for  reference.     Microbiology: Recent Results (from the past 240 hour(s))  Urine Culture (for pregnant, neutropenic or urologic patients or patients with an indwelling urinary catheter)     Status: None   Collection Time: 08/12/22 11:44 AM   Specimen: Urine, Clean Catch  Result Value Ref Range Status   Specimen Description URINE, CLEAN CATCH  Final   Special Requests NONE  Final   Culture   Final    NO GROWTH Performed at [facility], [place] [address], [place], [place] [address]    Report Status 08/13/2022 FINAL  Final     Labs: BNP (last 3 results) No results for input(s): "BNP" in the last 8760 hours. Basic Metabolic Panel: Recent Labs  Lab 08/11/22 1420 08/13/22 0435 08/14/22 0344 08/15/22 0410 08/17/22 0359  NA 134* 135 138 137 139  K 3.8 3.3* 3.4* 3.4* 3.9  CL 102 104 106 104 102  CO2 '23 23 23 24 27  '$ GLUCOSE 346* 211* 152* 116* 131*  BUN 12 10 7* 7* 9  CREATININE 1.06 0.88 0.86 0.83 1.00  CALCIUM 8.8* 8.2* 8.7* 8.8* 9.3  MG 1.9 1.8 2.0 1.9 2.0   Liver Function Tests: Recent Labs  Lab 08/10/22 2108 08/13/22 0435  AST 26 19  ALT 32 21  ALKPHOS 73 53  BILITOT 0.8 0.5  PROT 7.6 5.4*  ALBUMIN 4.2 2.8*   No results for input(s): "LIPASE", "AMYLASE" in the last 168 hours. Recent Labs  Lab 08/15/22 0935  AMMONIA 34   CBC: Recent Labs  Lab 08/10/22 2108 08/10/22 2135 08/12/22 0749 08/13/22 0435  WBC 11.0*  --  8.6 7.1  NEUTROABS 5.3  --   --  3.0  HGB 16.1 16.7 13.4 12.3*  HCT 46.1 49.0 38.1* 36.3*  MCV 84.3  --  83.9 85.2  PLT 283  --  203 193   Cardiac Enzymes: Recent Labs  Lab 08/11/22 1420  CKTOTAL 38*   BNP: Invalid input(s): "POCBNP" CBG: Recent Labs  Lab 08/16/22 0617 08/16/22 1139 08/16/22 1601 08/16/22 2217 08/17/22 0620  GLUCAP 159* 220* 167* 262* 136*   D-Dimer No results for input(s): "DDIMER" in the last 72 hours. Hgb A1c No results for input(s): "HGBA1C" in the last 72 hours. Lipid Profile No results for  input(s): "CHOL", "HDL", "LDLCALC", "TRIG", "CHOLHDL", "LDLDIRECT" in the last 72 hours. Thyroid function studies No results for input(s): "TSH", "T4TOTAL", "T3FREE", "THYROIDAB" in the last 72 hours.  Invalid input(s): "FREET3" Anemia work up No results for input(s): "VITAMINB12", "FOLATE", "FERRITIN", "TIBC", "IRON", "RETICCTPCT" in the last 72 hours. Urinalysis    Component Value Date/Time   COLORURINE YELLOW 08/10/2022 2107   APPEARANCEUR CLEAR 08/10/2022 2107   LABSPEC 1.028 08/10/2022 2107   PHURINE 5.0 08/10/2022 2107   GLUCOSEU >=500 (A) 08/10/2022 2107   HGBUR NEGATIVE 08/10/2022 2107   [name] NEGATIVE 08/10/2022 2107  KETONESUR 5 (A) 08/10/2022 2107   PROTEINUR NEGATIVE 08/10/2022 2107   NITRITE POSITIVE (A) 08/10/2022 2107   LEUKOCYTESUR NEGATIVE 08/10/2022 2107   Sepsis Labs Recent Labs  Lab 08/10/22 2108 08/12/22 0749 08/13/22 0435  WBC 11.0* 8.6 7.1   Microbiology Recent Results (from the past 240 hour(s))  Urine Culture (for pregnant, neutropenic or urologic patients or patients with an indwelling urinary catheter)     Status: None   Collection Time: 08/12/22 11:44 AM   Specimen: Urine, Clean Catch  Result Value Ref Range Status   Specimen Description URINE, CLEAN CATCH  Final   Special Requests NONE  Final   Culture   Final    NO GROWTH Performed at [facility], [place] [address], [place], [place] [address]    Report Status 08/13/2022 FINAL  Final     Time coordinating discharge: 35 minutes  SIGNED:   Aline August, MD  [facility] 08/17/2022, 10:25 AM

## 2022-08-17 NOTE — TOC Transition Note (Addendum)
Transition of Care [facility]) - CM/SW Discharge Note   Patient Details  Name: Russell Robles MRN: [id number] Date of Birth: Sep 11, 1960  [facility]) CM/SW Contact:  Pollie Friar, RN Phone Number: 08/17/2022, 10:16 AM   Clinical Narrative:    Pt is discharging home. He will follow up with [facility] for outpatient therapy. Information is on the AVS.  Wife is going to arrange supervision at home.  Wife will also provide transport home.  1553: wife has decided they prefer HH therapies instead of outpatient. CM was able to get this arranged with [facility]. Information on the AVS.    Final next level of care: OP Rehab Barriers to Discharge: No Barriers Identified   Patient Goals and CMS Choice   Choice offered to / list presented to : Spouse  Discharge Placement                         Discharge Plan and Services Additional resources added to the After Visit Summary for     Discharge Planning Services: CM Consult                                 Social Determinants of Health (SDOH) Interventions SDOH Screenings   Food Insecurity: No Food Insecurity (08/12/2022)  Housing: Low Risk  (08/12/2022)  Transportation Needs: No Transportation Needs (08/12/2022)  Utilities: Not At Risk (08/12/2022)  Depression (PHQ2-9): Low Risk  (05/17/2022)  Tobacco Use: Low Risk  (08/12/2022)     Readmission Risk Interventions     No data to display

## 2022-08-17 NOTE — Telephone Encounter (Signed)
Pt wife is calling for a referral for [facility].   Pt is currently still in the hospital 248 at the moment.

## 2022-08-17 NOTE — Progress Notes (Signed)
Discharge instructions reviewed with pt and his wife.  Copy of instructions given to pt/wife. Scripts being filled by [facility] and will be picked up as pt leaves hospital.  Pt/wife asked about getting an endocrinology referral, reached out to hospitalist, pt will have to get a referral from his PCP.  Pt/wife also asked if his PT/OT can be changed to home health. Wife states she hasn't worked in 2 weeks, and will be difficult to get pt to therapy 3 times a week.  Spoke with unit CM, she will look into it and speak with them as to what their options are.   Pt to be d/c'd via wheelchair with belongings, with wife.           To be escorted by staff once all questions have been answered or addressed and will stop by [facility] to pick up his medications.

## 2022-08-17 NOTE — Progress Notes (Signed)
Echocardiogram 2D Echocardiogram has been performed.  Ronny Flurry 08/17/2022, 1:47 PM

## 2022-08-18 ENCOUNTER — Telehealth: Payer: Self-pay

## 2022-08-18 ENCOUNTER — Ambulatory Visit: Payer: BC Managed Care – PPO | Admitting: Nurse Practitioner

## 2022-08-18 NOTE — Transitions of Care (Post Inpatient/ED Visit) (Signed)
   08/18/2022  Name: Russell Robles MRN: [id number] DOB: 08-Aug-1960  Today's TOC FU Call Status: Today's TOC FU Call Status:: Successful TOC FU Call Competed TOC FU Call Complete Date: 08/18/22  Transition Care Management Follow-up Telephone Call Date of Discharge: 08/17/22 Discharge Facility: [facility]) Type of Discharge: Inpatient Admission Primary Inpatient Discharge Diagnosis:: "seizures, confusion, dizzy spells" How have you been since you were released from the hospital?: Better (Spouse states patient still gets a little confused about certain things at times but overall is doing better.) Any questions or concerns?: Yes Patient Questions/Concerns:: Spouse with questions regarding patient's insulin-including questions regarding cost/coverage requesting to speak with pharmacy where she got meds from Patient Questions/Concerns Addressed: Other: (RN CM provided sposue with TOC pharmacy number to follow up)  Items Reviewed: Did you receive and understand the discharge instructions provided?: Yes Medications obtained and verified?: No (brief med review completed as patient currently at work) Any new allergies since your discharge?: No Dietary orders reviewed?: NA Do you have support at home?: Yes People in Home: spouse Name of Support/Comfort Primary Source: [facility] and Equipment/Supplies: [place] Ordered?: Yes Name of [place]:: [place] Has Agency set up a time to come to your home?: No EMR reviewed for Home Health Orders: Orders present/patient has not received call (refer to CM for follow-up) (spouse aware to follow up with agency if she has not heard from them 48hrs post discharge-confirmed she has contact info) Any new equipment or medical supplies ordered?: No  Functional Questionnaire: Do you need assistance with bathing/showering or dressing?: Yes Do you need assistance with meal preparation?: Yes Do you need assistance with  eating?: No Do you have difficulty maintaining continence: No Do you need assistance with getting out of bed/getting out of a chair/moving?: Yes Do you have difficulty managing or taking your medications?: Yes  Folllow up appointments reviewed: PCP Follow-up appointment confirmed?: No (spouse will call on her own to arrange appt to coordinate with her work schedule) [facility] Follow-up appointment confirmed?: Yes Date of Specialist follow-up appointment?: 08/22/22 Follow-Up Specialty Provider:: Dr. Delice Lesch, 09/12/22-DrAngelena Form Do you need transportation to your follow-up appointment?: No Do you understand care options if your condition(s) worsen?: Yes-patient verbalized understanding  SDOH Interventions Today    Flowsheet Row Most Recent Value  SDOH Interventions   Food Insecurity Interventions Intervention Not Indicated  Transportation Interventions Intervention Not Indicated      TOC Interventions Today    Flowsheet Row Most Recent Value  TOC Interventions   TOC Interventions Discussed/Reviewed TOC Interventions Discussed       Interventions Today    Flowsheet Row Most Recent Value  Education Interventions   Education Provided Provided Education  Provided Verbal Education On Nutrition, Blood Sugar Monitoring, Medication, When to see the doctor  Nutrition Interventions   Nutrition Discussed/Reviewed Nutrition Discussed, Decreasing sugar intake  Pharmacy Interventions   Pharmacy Dicussed/Reviewed Pharmacy Topics Discussed, Medications and their functions  [provided spouse with [facility] TOC pharmacy number to call to f/u on her questions regarding insulins]       Enzo Montgomery, RN,BSN,CCM [facility] Care Management Care Management Community Coordinator Direct Phone: [phone] Toll Free: [phone] Fax: [phone]

## 2022-08-22 ENCOUNTER — Ambulatory Visit: Payer: No Typology Code available for payment source | Admitting: Neurology

## 2022-08-22 ENCOUNTER — Encounter: Payer: Self-pay | Admitting: Neurology

## 2022-08-22 VITALS — BP 151/95 | HR 58 | Ht 73.0 in | Wt 210.4 lb

## 2022-08-22 DIAGNOSIS — R569 Unspecified convulsions: Secondary | ICD-10-CM

## 2022-08-22 MED ORDER — LEVETIRACETAM 1000 MG PO TABS: 2000.0000 mg | ORAL_TABLET | Freq: Two times a day (BID) | ORAL | 6 refills | Status: DC

## 2022-08-22 MED ORDER — DIVALPROEX SODIUM 500 MG PO DR TAB: 500.0000 mg | DELAYED_RELEASE_TABLET | Freq: Two times a day (BID) | ORAL | 2 refills | Status: DC

## 2022-08-22 MED ORDER — LACOSAMIDE 100 MG PO TABS: 100.0000 mg | ORAL_TABLET | Freq: Two times a day (BID) | ORAL | 3 refills | Status: DC

## 2022-08-22 NOTE — Patient Instructions (Addendum)
Good to meet you.  Schedule MRI brain with and without contrast   2. Schedule 1-hour EEG  3. Start weaning off the clonazepam '1mg'$ : take 1 tablet twice a day for 1 week, then reduce to 1 tablet every night for 1 week, then stop  4. Update me how you are feeling off the clonazepam, we will plan to then start reducing Keppra  5. Let me know if home physical therapy falls through and we can send a different order  6. Continue Vimpat '100mg'$  twice a day, Depakote '500mg'$  twice a day, Keppra '1000mg'$ : 2 tablets twice a day for now  7. Continue daily aspirin, control of sugar levels  8. Follow-up in 2 months, call for any changes   Seizure Precautions: 1. If medication has been prescribed for you to prevent seizures, take it exactly as directed.  Do not stop taking the medicine without talking to your doctor first, even if you have not had a seizure in a long time.   2. Avoid activities in which a seizure would cause danger to yourself or to others.  Don't operate dangerous machinery, swim alone, or climb in high or dangerous places, such as on ladders, roofs, or girders.  Do not drive unless your doctor says you may.  3. If you have any warning that you may have a seizure, lay down in a safe place where you can't hurt yourself.    4.  No driving for 6 months from last seizure, as per [employer].   Please refer to the following link on the [place] website for more information: http://www.epilepsyfoundation.org/answerplace/Social/driving/drivingu.cfm   5.  Maintain good sleep hygiene.  6.  Contact your doctor if you have any problems that may be related to the medicine you are taking.  7.  Call 911 and bring the patient back to the ED if:        A.  The seizure lasts longer than 5 minutes.       B.  The patient doesn't awaken shortly after the seizure  C.  The patient has new problems such as difficulty seeing, speaking or moving  D.  The patient was  injured during the seizure  E.  The patient has a temperature over 102 F (39C)  F.  The patient vomited and now is having trouble breathing

## 2022-08-22 NOTE — Progress Notes (Signed)
NEUROLOGY CONSULTATION NOTE  Russell Robles MRN: [id number] DOB: Dec 01, 1960  Referring provider: Dr. Zeb Comfort Primary care provider: Leretha Pol, NP  Reason for consult:  new onset seizures  Dear Dr Hortense Ramal:  Thank you for your kind referral of Russell Robles for consultation of the above symptoms. Although his history is well known to you, please allow me to reiterate it for the purpose of our medical record. The patient was accompanied to the clinic by his wife who also provides collateral information. Records and images were personally reviewed where available.   HISTORY OF PRESENT ILLNESS: This is a 62 year old right-handed man with a history of DM2, hypertension, hyperlipidemia, in his usual state of health until mid-February when he started having episodes of dizziness with transient blurred vision. He would feel that everything from mid-point would move to the right for 3-5 minutes, then start over again, lasting 10-15 minutes. He is able to comprehend but would not form the sentences he wanted to. He went to the ER for these symptoms on 08/04/22 and reported recently stopping Ozempic and starting Janumet a few days prior. Glucose was 510. Head CT no acute changes, there was an old left basal ganglia lacunar infarct and diffuse atrophy and chronic microvascular disease. On his PCP visit on 08/10/22, they reported moderate confusion, he was forgetting simple instructions, words, thought process changes completely, forgetting thought mid-sentence. He went to the ER later that day and was seen by Neurology, he reported a sensation of his vision moving to the right continuously, his wife reported freezing with a blank stare lasting seconds to minutes. He was witnessed to have 2 episodes of head and eye turn to the right, unresponsive, with lip smacking lasting 10-15 seconds. There was increased tone on right upper extremity during them. Limited MRI brain no acute changes on DWI  imaging. MRA normal. Overnight EEG 2/22-2/23 showed focal seizures arising from the left parieto-occipital region, he had an average for 3 seizures per hour lasting 2-4 minutes. He was initially on Levetiracetam increased to 2000mg  BID, then Vimpat 100mg  BID added due to continued seizures, then clonazepam 1mg  TID as he continued to have seizures on 2/23-2/24 vEEG monitoring. Depakote 500mg  BID added on. EEG from 2/24-2/25 captured 4 focal seizures, last was on 2/24 at 3pm, then none for the next 24 hours. Seizures felt to be provoked seizures in the setting of hyperglycemia. He had a repeat brain MRI without contrast on 08/15/22 which was normal.  He and his wife deny any further seizures since his hospitalization, however Helene Kelp notes that he is very unstable, speech is different, and there is still confusion but different than the seizures. Speech is slower, softer, mumbling at times. He closes his eyes a lot and says his eyes are tired. He sleeps majority of the day, getting up for an hour then going back to sleep. He sleeps through the night. He states he can carry on conversations but feels weak, he uses a cane to get up. He is dropping things all the time. His wife has to help him up sometimes. He denies any olfactory/gustatory hallucinations, focal numbness/tingling/weakness, myoclonic jerks. No headaches, dizziness, no further vision changes, neck pain/back pain, bowel dysfunction. He has some urinary urgency and sometimes incontinence. Glucose levels have improved, up to 200 but mostly between 110-120 at home. No alcohol use. He lives with his wife. He had been doing maintenance work prior to the seizures. He had a normal birth and early  development.  There is no history of febrile convulsions, CNS infections such as meningitis/encephalitis, significant traumatic brain injury, neurosurgical procedures, or family history of seizures.   PAST MEDICAL HISTORY: Past Medical History:  Diagnosis Date    Anxiety    Arthritis    Cough    PER PT NON-PRODUCTIVE , NO FEVER OR CONGESTION   Hernia, inguinal, right    Hyperlipidemia    Hypertension    Type 2 diabetes mellitus ([place])    last  HbA1c 7.2 on 05-09-2017 in epic    PAST SURGICAL HISTORY: Past Surgical History:  Procedure Laterality Date   CARPAL TUNNEL RELEASE Left 08/02/2018   Procedure: CARPAL TUNNEL RELEASE;  Surgeon: Thornton Park, MD;  Location: [facility] ORS;  Service: Orthopedics;  Laterality: Left;   HERNIA REPAIR N/A    Phreesia 05/18/2020   INGUINAL HERNIA REPAIR Left 12-16-2010  dr Donne Hazel  [place] Right 06/01/2017   Procedure: OPEN RIGHT INGUINAL HERNIA REPAIR WITH MESH;  Surgeon: Kinsinger, Arta Bruce, MD;  Location: [facility];  Service: General;  Laterality: Right;  GENERAL AND TAP BLOCK   INSERTION OF MESH Right 06/01/2017   Procedure: INSERTION OF MESH;  Surgeon: Kinsinger, Arta Bruce, MD;  Location: [place];  Service: General;  Laterality: Right;  GENERAL AND TAP BLOCK    MEDICATIONS: Current Outpatient Medications on File Prior to Visit  Medication Sig Dispense Refill   amLODipine-valsartan (EXFORGE) 5-160 MG tablet TAKE 1 TABLET BY MOUTH DAILY. (TAKE WITH HCTZ 12.5 MG TABLET) (Patient taking differently: Take 1 tablet by mouth daily. TAKE 1 TABLET BY MOUTH DAILY. (TAKE WITH HCTZ 12.5 MG TABLET)) 90 tablet 1   aspirin EC 81 MG tablet Take 81 mg by mouth daily. Swallow whole.     Cholecalciferol (VITAMIN D3) 125 MCG (5000 UT) TABS 5,000 IU OTC vitamin D3 daily. (Patient taking differently: Take 5,000 Units by mouth daily at 12 noon. 5,000 IU OTC vitamin D3 daily.) 90 tablet 3   clonazePAM (KLONOPIN) 1 MG tablet Take 1 tablet (1 mg total) by mouth 3 (three) times daily. 90 tablet 0   Continuous Blood Gluc Sensor (DEXCOM G7 SENSOR) MISC Use as directed 3 each 0   Cyanocobalamin (VITAMIN B12 PO) Take 1 tablet by mouth daily.     divalproex (DEPAKOTE) 500 MG DR  tablet Take 1 tablet (500 mg total) by mouth 2 (two) times daily. 180 tablet 0   Dulaglutide (TRULICITY) 1.54 MG/8.6PY SOPN Inject 0.75 mg into the skin once a week. 2 mL 0   Insulin Glargine (BASAGLAR KWIKPEN) 100 UNIT/ML Inject 25 Units into the skin daily. 15 mL 0   Insulin Pen Needle 31G X 8 MM MISC Use with Insulin Glargine to inject 25 units daily 100 each 0   Lacosamide 100 MG TABS Take 1 tablet (100 mg total) by mouth 2 (two) times daily. 180 tablet 0   levETIRAcetam (KEPPRA) 1000 MG tablet Take 2 tablets (2,000 mg total) by mouth 2 (two) times daily. 180 tablet 0   nitroGLYCERIN (NITROSTAT) 0.4 MG SL tablet Place 1 tablet (0.4 mg total) under the tongue every 5 (five) minutes as needed for chest pain. If a third tablet is needed please call 911 or go to the emergency department. 30 tablet 0   Potassium 99 MG TABS Take by mouth.     simvastatin (ZOCOR) 80 MG tablet Take 1 tablet (80 mg total) by mouth at bedtime. 90 tablet 1   sitaGLIPtin-metformin (  JANUMET) 50-500 MG tablet Take 1 tablet by mouth 2 (two) times daily with a meal. 60 tablet 2   No current facility-administered medications on file prior to visit.    ALLERGIES: No Known Allergies  FAMILY HISTORY: Family History  Problem Relation Age of Onset   COPD Mother    Aortic aneurysm Father    Lung cancer Sister    Colon cancer Neg Hx    Colon polyps Neg Hx    Esophageal cancer Neg Hx    Rectal cancer Neg Hx    Stomach cancer Neg Hx     SOCIAL HISTORY: Social History   Socioeconomic History   Marital status: Married    Spouse name: Not on file   Number of children: Not on file   Years of education: Not on file   Highest education level: Not on file  Occupational History   Not on file  Tobacco Use   Smoking status: Never   Smokeless tobacco: Never  Vaping Use   Vaping Use: Never used  Substance and Sexual Activity   Alcohol use: Not Currently    Comment: occ   Drug use: No   Sexual activity: Yes     Partners: Female    Birth control/protection: None  Other Topics Concern   Not on file  Social History Narrative   Are you right handed or left handed? Right handed    Are you currently employed ? 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 No   Who lives with you? Family    What type of home do you live in: 1 story or 2 story? 1 story        Social Determinants of Health   Financial Resource Strain: Not on file  Food Insecurity: No Food Insecurity (08/18/2022)   Hunger Vital Sign    Worried About Running Out of Food in the Last Year: Never true    Ran Out of Food in the Last Year: Never true  Transportation Needs: No Transportation Needs (08/18/2022)   PRAPARE - Hydrologist (Medical): No    Lack of Transportation (Non-Medical): No  Physical Activity: Not on file  Stress: Not on file  Social Connections: Not on file  Intimate Partner Violence: Not At Risk (08/12/2022)   Humiliation, Afraid, Rape, and Kick questionnaire    Fear of Current or Ex-Partner: No    Emotionally Abused: No    Physically Abused: No    Sexually Abused: No     PHYSICAL EXAM: Vitals:   08/22/22 1255  BP: (!) 151/95  Pulse: (!) 58  SpO2: 94%   General: No acute distress Head:  Normocephalic/atraumatic Skin/Extremities: No rash, no edema Neurological Exam: Mental status: alert and oriented to person, place, and time, no dysarthria or aphasia, Fund of knowledge is appropriate.  Recent and remote memory are intact, 3/3 delayed recall.  Attention and concentration are normal, 5/5 WORLD backwards.  Cranial nerves: CN I: not tested CN II: pupils equal, round, visual fields intact CN III, IV, VI:  full range of motion, no nystagmus, no ptosis CN V: facial sensation intact CN VII: upper and lower face symmetric CN VIII: hearing intact to conversation Bulk & Tone: normal, no fasciculations. Motor: 5/5 throughout with no  pronator drift. Sensation: intact to light touch, cold, pin, vibration sense.  No extinction to double simultaneous stimulation.   Deep Tendon Reflexes: +2 throughout  Cerebellar: no incoordination on finger to nose testing Gait: slow and cautious, unsteady Tremor: none   IMPRESSION: This is a 62 year old right-handed man with a history of DM2, hypertension, hyperlipidemia, in his usual state of health until mid-February when he started having episodes of dizziness with transient blurred vision when looking to the right, that progressed to witnessed episodes of head/eye turn to the right, unresponsiveness with lip smacking, increased tone on right arm. MRI brain without contrast no acute changes. Overnight Eshowed focal seizures arising from the left parieto-occipital region, he was having an average of 3 seizures per hour and required increasing doses of seizure medication for seizure control. He was discharged home on Levetiracetam 2000mg  BID, Vimpat 100mg  BID, Depakote 500mg  BID, and clonazepam 1mg  TID. No seizures since 08/13/22, however he is having significant drowsiness and gait instability likely due to multiple medications. We discussed repeating brain MRI with and without contrast and a 1-hour EEG. He will start slowly weaning off the clonazepam to 1 tab BID for 1 week, then 1 tab qhs for a week, then stop. We may then plan on gradually reducing Levetiracetam next. Continue daily aspirin, control of vascular risk factors, particularly glucose levels as seizures were felt to be provoked seizures in the setting of hyperglycemia, which we discussed can cause focal seizures. [place] driving laws discussed, no driving until 6 months seizure-free. Follow-up in 2 months, call for any changes.    Thank you for allowing me to participate in the care of this patient. Please do not hesitate to call for any questions or concerns.   Ellouise Newer, M.D.  CC: Leretha Pol, NP

## 2022-08-23 ENCOUNTER — Encounter: Payer: Self-pay | Admitting: Neurology

## 2022-08-25 ENCOUNTER — Telehealth: Payer: Self-pay

## 2022-08-25 ENCOUNTER — Encounter: Payer: Self-pay | Admitting: Neurology

## 2022-08-25 NOTE — Telephone Encounter (Signed)
Pt called no answer left a voice mail that letter was ready for pick up

## 2022-08-25 NOTE — Telephone Encounter (Signed)
Fax is [phone] asked if we can send it that number if possible.

## 2022-08-25 NOTE — Telephone Encounter (Signed)
Letter Faxed to [phone]

## 2022-08-26 ENCOUNTER — Ambulatory Visit: Payer: No Typology Code available for payment source

## 2022-08-26 ENCOUNTER — Telehealth: Payer: Self-pay | Admitting: *Deleted

## 2022-08-26 DIAGNOSIS — R569 Unspecified convulsions: Secondary | ICD-10-CM

## 2022-08-26 NOTE — Telephone Encounter (Signed)
Amy calling with [facility] to report and get verbal for pt.  She saw him yesterday and her plan for PT will be as below and verbal was given will wait for fax to be completed by provider.   2x wk for 1wk 1x wk for 3wk starting next week.    She also wanted to let provider know that while reviewing pts medication there was 1 level one interaction with amlodipine/valsartan and simvastatin. Informed her that I would send message to provider, she also asked if pt had a hospital follow up scheduled and I informed her that he has appointment on 09/19/22 with provider and looks like he had appointments on 2/28 and 2/29 that were cancelled and it looks like pt is establishing care somewhere else on 09/07/22.  Amy can be reached at [phone] if you have any questions.

## 2022-08-26 NOTE — Progress Notes (Unsigned)
EEG complete - results pending 

## 2022-08-30 NOTE — Procedures (Signed)
ELECTROENCEPHALOGRAM REPORT  Date of Study: 08/26/2022  Patient's Name: Russell Robles MRN: [id number] Date of Birth: 08/31/1960  Referring Provider: Dr. 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 He is on multiple medications with plans for streamlining. EEG to assess for continued seizure activity.  Medications: Depakote, lacosamide, Levetiracetam, clonazepam  Technical Summary: A multichannel digital 1-hour EEG recording measured by the international 10-20 system with electrodes applied with paste and impedances below 5000 ohms performed in our laboratory with EKG monitoring in an awake and asleep patient.  Hyperventilation and photic stimulation were performed.  The digital EEG was referentially recorded, reformatted, and digitally filtered in a variety of bipolar and referential montages for optimal display.    Description: The patient is awake and asleep during the recording.  During maximal wakefulness, there is a symmetric, medium voltage 9-9.5 Hz posterior dominant rhythm that attenuates with eye opening.  The record is symmetric.  During drowsiness and sleep, there is an increase in theta slowing of the background with vertex waves seen. Hyperventilation and photic stimulation did not elicit any abnormalities.  There were no epileptiform discharges or electrographic seizures seen.    EKG lead was unremarkable.  Impression: This 1-hour awake and asleep EEG is normal.    Clinical Correlation: A normal EEG does not exclude a clinical diagnosis of epilepsy.  If further clinical questions remain, prolonged EEG may be helpful.  Clinical correlation is advised.   Ellouise Newer, M.D.

## 2022-09-01 ENCOUNTER — Telehealth: Payer: Self-pay

## 2022-09-01 ENCOUNTER — Ambulatory Visit: Payer: No Typology Code available for payment source | Admitting: Neurology

## 2022-09-01 DIAGNOSIS — E118 Type 2 diabetes mellitus with unspecified complications: Secondary | ICD-10-CM

## 2022-09-01 MED ORDER — DEXCOM G7 SENSOR MISC: 3 refills | Status: DC

## 2022-09-01 NOTE — Telephone Encounter (Signed)
Sensors were sent to [facility].

## 2022-09-01 NOTE — Telephone Encounter (Signed)
He needs to continue with the amlodipine and the simvastatin despite the interaction. And ok about the verbal order. I will sign the fax when I get it. Thank you.

## 2022-09-01 NOTE — Telephone Encounter (Signed)
Patient called requesting refill on Continuous Blood Gluc Sensor [place], patient was prescribed in hospital, please advise, thanks.

## 2022-09-02 NOTE — Telephone Encounter (Signed)
Tried to contact Amy to be sure that she had received below message and phone VM had not been set up so unable to LM for return call.

## 2022-09-06 ENCOUNTER — Encounter: Payer: Self-pay | Admitting: Neurology

## 2022-09-07 ENCOUNTER — Encounter: Payer: Self-pay | Admitting: Internal Medicine

## 2022-09-07 ENCOUNTER — Ambulatory Visit: Payer: No Typology Code available for payment source | Admitting: Internal Medicine

## 2022-09-07 VITALS — BP 122/86 | HR 62 | Temp 98.2°F | Ht 68.5 in | Wt 214.0 lb

## 2022-09-07 DIAGNOSIS — E1142 Type 2 diabetes mellitus with diabetic polyneuropathy: Secondary | ICD-10-CM | POA: Insufficient documentation

## 2022-09-07 DIAGNOSIS — R569 Unspecified convulsions: Secondary | ICD-10-CM | POA: Diagnosis not present

## 2022-09-07 DIAGNOSIS — I6381 Other cerebral infarction due to occlusion or stenosis of small artery: Secondary | ICD-10-CM

## 2022-09-07 DIAGNOSIS — E1159 Type 2 diabetes mellitus with other circulatory complications: Secondary | ICD-10-CM

## 2022-09-07 DIAGNOSIS — I152 Hypertension secondary to endocrine disorders: Secondary | ICD-10-CM

## 2022-09-07 LAB — HM DIABETES FOOT EXAM

## 2022-09-07 MED ORDER — METFORMIN HCL 1000 MG PO TABS: 1000.0000 mg | ORAL_TABLET | Freq: Two times a day (BID) | ORAL | 3 refills | Status: DC

## 2022-09-07 MED ORDER — TRULICITY 1.5 MG/0.5ML ~~LOC~~ SOAJ: 1.5000 mg | SUBCUTANEOUS | 11 refills | Status: DC

## 2022-09-07 MED ORDER — BASAGLAR KWIKPEN 100 UNIT/ML ~~LOC~~ SOPN: 25.0000 [IU] | PEN_INJECTOR | Freq: Every day | SUBCUTANEOUS | 11 refills | Status: DC

## 2022-09-07 NOTE — Assessment & Plan Note (Signed)
BP Readings from Last 3 Encounters:  09/07/22 122/86  08/22/22 (!) 151/95  08/17/22 139/81   Controlled on amlodipine 5, valsartan 160

## 2022-09-07 NOTE — Progress Notes (Signed)
Subjective:    Patient ID: Russell Robles, male    DOB: 1960/09/10, 62 y.o.   MRN: [id number]  HPI Here with wife to establish care Upset that he wasn't getting his meds for diabetes---wasn't getting the proper preauthorization  Had seizures and was hospitalized due to being out of control No seizures since hospital discharge Is on the depakote, lacosamide and keppra. Off clonazepam now Still can't drive----unable to work for now  Is on 25 units insulin  Just started dulaglutide a couple of weeks ago----ozempic in the past (and changed due to insurance) Sugars now better in the past 2 weeks or so--still not controlled (A1c was up to 13%) Was doing well on the ozempic--but then out of control when off it Does have foot burning and itching--has "to stretch them to keep them from hurting"  Has some right sided chest pain---radiating to back Fairly constant--worse if bending over or stretching the wrong way No heartburn/indigestion  No SOB No dizziness or syncope  On BP meds for some time Usually well controlled  Current Outpatient Medications on File Prior to Visit  Medication Sig Dispense Refill   amLODipine-valsartan (EXFORGE) 5-160 MG tablet TAKE 1 TABLET BY MOUTH DAILY. (TAKE WITH HCTZ 12.5 MG TABLET) (Patient taking differently: Take 1 tablet by mouth daily. TAKE 1 TABLET BY MOUTH DAILY. (TAKE WITH HCTZ 12.5 MG TABLET)) 90 tablet 1   aspirin EC 81 MG tablet Take 81 mg by mouth daily. Swallow whole.     Cholecalciferol (VITAMIN D3) 125 MCG (5000 UT) TABS 5,000 IU OTC vitamin D3 daily. (Patient taking differently: Take 5,000 Units by mouth daily at 12 noon. 5,000 IU OTC vitamin D3 daily.) 90 tablet 3   Continuous Blood Gluc Sensor (DEXCOM G7 SENSOR) MISC Use as directed 4 each 3   Cyanocobalamin (VITAMIN B12 PO) Take 1 tablet by mouth daily.     divalproex (DEPAKOTE) 500 MG DR tablet Take 1 tablet (500 mg total) by mouth 2 (two) times daily. 180 tablet 2   Dulaglutide  (TRULICITY) A999333 0000000 SOPN Inject 0.75 mg into the skin once a week. 2 mL 0   Insulin Glargine (BASAGLAR KWIKPEN) 100 UNIT/ML Inject 25 Units into the skin daily. 15 mL 0   Insulin Pen Needle 31G X 8 MM MISC Use with Insulin Glargine to inject 25 units daily 100 each 0   Lacosamide 100 MG TABS Take 1 tablet (100 mg total) by mouth 2 (two) times daily. 180 tablet 3   levETIRAcetam (KEPPRA) 1000 MG tablet Take 2 tablets (2,000 mg total) by mouth 2 (two) times daily. 120 tablet 6   nitroGLYCERIN (NITROSTAT) 0.4 MG SL tablet Place 1 tablet (0.4 mg total) under the tongue every 5 (five) minutes as needed for chest pain. If a third tablet is needed please call 911 or go to the emergency department. 30 tablet 0   simvastatin (ZOCOR) 80 MG tablet Take 1 tablet (80 mg total) by mouth at bedtime. 90 tablet 1   sitaGLIPtin-metformin (JANUMET) 50-500 MG tablet Take 1 tablet by mouth 2 (two) times daily with a meal. 60 tablet 2   Potassium 99 MG TABS Take by mouth. (Patient not taking: Reported on 09/07/2022)     No current facility-administered medications on file prior to visit.    No Known Allergies  Past Medical History:  Diagnosis Date   Anxiety    Arthritis    Cough    PER PT NON-PRODUCTIVE , NO FEVER OR CONGESTION  Hernia, inguinal, right    Hyperlipidemia    Hypertension    Type 2 diabetes mellitus ([place])    last  HbA1c 7.2 on 05-09-2017 in epic    Past Surgical History:  Procedure Laterality Date   CARPAL TUNNEL RELEASE Left 08/02/2018   Procedure: CARPAL TUNNEL RELEASE;  Surgeon: Thornton Park, MD;  Location: [facility];  Service: Orthopedics;  Laterality: Left;   HERNIA REPAIR N/A    Phreesia 05/18/2020   INGUINAL HERNIA REPAIR Left 12-16-2010  dr Donne Hazel  [place] Right 06/01/2017   Procedure: OPEN RIGHT INGUINAL HERNIA REPAIR WITH MESH;  Surgeon: Kinsinger, Arta Bruce, MD;  Location: [facility];  Service: General;  Laterality: Right;   GENERAL AND TAP BLOCK   INSERTION OF MESH Right 06/01/2017   Procedure: INSERTION OF MESH;  Surgeon: Kinsinger, Arta Bruce, MD;  Location: [facility];  Service: General;  Laterality: Right;  GENERAL AND TAP BLOCK    Family History  Problem Relation Age of Onset   COPD Mother    Aortic aneurysm Father    COPD Sister    Lung cancer Sister    Colon cancer Neg Hx    Colon polyps Neg Hx    Esophageal cancer Neg Hx    Rectal cancer Neg Hx    Stomach cancer Neg Hx     Social History   Socioeconomic History   Marital status: Married    Spouse name: Not on file   Number of children: 3   Years of education: Not on file   Highest education level: Not on file  Occupational History   Occupation: Buidling maintenance    Comment: Teacher, music  Tobacco Use   Smoking status: Never    Passive exposure: Past   Smokeless tobacco: Never  Vaping Use   Vaping Use: Never used  Substance and Sexual Activity   Alcohol use: Not Currently    Comment: occasional beer   Drug use: No   Sexual activity: Yes    Partners: Female    Birth control/protection: None  Other Topics Concern   Not on file  Social History Narrative   Are you right handed or left handed? Right handed    Are you currently employed ? 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 No   Who lives with you? Family    What type of home do you live in: 1 story or 2 story? 1 story       2 stepsons, 1 son       Social Determinants of Radio broadcast assistant Strain: Not on file  Food Insecurity: No Food Insecurity (08/18/2022)   Hunger Vital Sign    Worried About Running Out of Food in the Last Year: Never true    Ran Out of Food in the Last Year: Never true  Transportation Needs: No Transportation Needs (08/18/2022)   PRAPARE - Hydrologist (Medical): No    Lack of Transportation (Non-Medical): No  Physical  Activity: Not on file  Stress: Not on file  Social Connections: Not on file  Intimate Partner Violence: Not At Risk (08/12/2022)   Humiliation, Afraid, Rape, and Kick questionnaire    Fear of Current or Ex-Partner: No    Emotionally Abused: No    Physically Abused: No    Sexually Abused: No   Review of Systems  Weight is stable Sleeps well Energy finally coming back since the hospitalization Bowels are fine Voids okay No sig back or joint pains     Objective:   Physical Exam Constitutional:      Appearance: Normal appearance.  Cardiovascular:     Rate and Rhythm: Normal rate and regular rhythm.     Pulses: Normal pulses.     Heart sounds:     No gallop.     Comments: Soft systolic murmur along sternal border Pulmonary:     Effort: Pulmonary effort is normal.     Breath sounds: Normal breath sounds. No wheezing or rales.  Abdominal:     Palpations: Abdomen is soft.     Tenderness: There is no abdominal tenderness.  Musculoskeletal:     Cervical back: Neck supple.     Right lower leg: No edema.     Left lower leg: No edema.  Lymphadenopathy:     Cervical: No cervical adenopathy.  Skin:    Findings: No rash.     Comments: No foot lesions  Neurological:     Mental Status: He is alert.     Comments: Normal sensation in plantar feet  Psychiatric:        Mood and Affect: Mood normal.        Behavior: Behavior normal.            Assessment & Plan:

## 2022-09-07 NOTE — Assessment & Plan Note (Signed)
Seen on CT scan but not MRI Is on statin and ASA

## 2022-09-07 NOTE — Assessment & Plan Note (Signed)
Related to out of control diabetes Multiple meds through neurology---working on weaning

## 2022-09-07 NOTE — Assessment & Plan Note (Signed)
Control was fine till ran out of meds Mild neuropathy Will increase the trulicity to 1.5mg  weekly Stop the sitagliptin Increase metformin to 1000 bid Continue 25 units of basaglar Consider jardiance

## 2022-09-08 ENCOUNTER — Encounter: Payer: Self-pay | Admitting: Internal Medicine

## 2022-09-08 NOTE — Telephone Encounter (Signed)
Wanted to send this message for you to review.  Also did patient establish with you? If so we need to update PCP on chart.

## 2022-09-11 NOTE — Progress Notes (Unsigned)
No chief complaint on file.  History of Present Illness: 62 yo male with history of HTN, HLD, DM, seizure disorder and anxiety who is here today as a new patient. He was admitted to [facility] in February 2024 with confusion and garbled speech. EEG showed seizures. He was started on antiepileptic medication and was discharged home. Echo 08/17/22 with LVEF=50-55%. No significant valve disease. He c/o occasional chest pains over the past few months and cardiology referral was made.   He tells me today that he ***  Primary Care Physician: Venia Carbon, MD   Past Medical History:  Diagnosis Date   Anxiety    Arthritis    Cough    PER PT NON-PRODUCTIVE , NO FEVER OR CONGESTION   Hernia, inguinal, right    Hyperlipidemia    Hypertension    Type 2 diabetes mellitus ([place])    last  HbA1c 7.2 on 05-09-2017 in epic    Past Surgical History:  Procedure Laterality Date   CARPAL TUNNEL RELEASE Left 08/02/2018   Procedure: CARPAL TUNNEL RELEASE;  Surgeon: Thornton Park, MD;  Location: [facility];  Service: Orthopedics;  Laterality: Left;   HERNIA REPAIR N/A    Phreesia 05/18/2020   INGUINAL HERNIA REPAIR Left 12-16-2010  dr Donne Hazel  [place] Right 06/01/2017   Procedure: OPEN RIGHT INGUINAL HERNIA REPAIR WITH MESH;  Surgeon: Kinsinger, Arta Bruce, MD;  Location: [facility];  Service: General;  Laterality: Right;  GENERAL AND TAP BLOCK   INSERTION OF MESH Right 06/01/2017   Procedure: INSERTION OF MESH;  Surgeon: Kinsinger, Arta Bruce, MD;  Location: [facility];  Service: General;  Laterality: Right;  GENERAL AND TAP BLOCK    Current Outpatient Medications  Medication Sig Dispense Refill   amLODipine-valsartan (EXFORGE) 5-160 MG tablet TAKE 1 TABLET BY MOUTH DAILY. (TAKE WITH HCTZ 12.5 MG TABLET) (Patient taking differently: Take 1 tablet by mouth daily. TAKE 1 TABLET BY MOUTH DAILY. (TAKE WITH HCTZ 12.5 MG TABLET)) 90 tablet 1   aspirin  EC 81 MG tablet Take 81 mg by mouth daily. Swallow whole.     Cholecalciferol (VITAMIN D3) 125 MCG (5000 UT) TABS 5,000 IU OTC vitamin D3 daily. (Patient taking differently: Take 5,000 Units by mouth daily at 12 noon. 5,000 IU OTC vitamin D3 daily.) 90 tablet 3   Continuous Blood Gluc Sensor (DEXCOM G7 SENSOR) MISC Use as directed 4 each 3   Cyanocobalamin (VITAMIN B12 PO) Take 1 tablet by mouth daily.     divalproex (DEPAKOTE) 500 MG DR tablet Take 1 tablet (500 mg total) by mouth 2 (two) times daily. 180 tablet 2   Dulaglutide (TRULICITY) 1.5 0000000 SOPN Inject 1.5 mg into the skin once a week. 2 mL 11   Insulin Glargine (BASAGLAR KWIKPEN) 100 UNIT/ML Inject 25 Units into the skin daily. 15 mL 11   Insulin Pen Needle 31G X 8 MM MISC Use with Insulin Glargine to inject 25 units daily 100 each 0   Lacosamide 100 MG TABS Take 1 tablet (100 mg total) by mouth 2 (two) times daily. 180 tablet 3   levETIRAcetam (KEPPRA) 1000 MG tablet Take 2 tablets (2,000 mg total) by mouth 2 (two) times daily. 120 tablet 6   metFORMIN (GLUCOPHAGE) 1000 MG tablet Take 1 tablet (1,000 mg total) by mouth 2 (two) times daily with a meal. 180 tablet 3   nitroGLYCERIN (NITROSTAT) 0.4 MG SL tablet Place 1 tablet (0.4 mg total)  under the tongue every 5 (five) minutes as needed for chest pain. If a third tablet is needed please call 911 or go to the emergency department. 30 tablet 0   Potassium 99 MG TABS Take by mouth. (Patient not taking: Reported on 09/07/2022)     simvastatin (ZOCOR) 80 MG tablet Take 1 tablet (80 mg total) by mouth at bedtime. 90 tablet 1   No current facility-administered medications for this visit.    No Known Allergies  Social History   Socioeconomic History   Marital status: Married    Spouse name: Not on file   Number of children: 3   Years of education: Not on file   Highest education level: Not on file  Occupational History   Occupation: Buidling maintenance    Comment: Sports administrator  Tobacco Use   Smoking status: Never    Passive exposure: Past   Smokeless tobacco: Never  Vaping Use   Vaping Use: Never used  Substance and Sexual Activity   Alcohol use: Not Currently    Comment: occasional beer   Drug use: No   Sexual activity: Yes    Partners: Female    Birth control/protection: None  Other Topics Concern   Not on file  Social History Narrative   Are you right handed or left handed? Right handed    Are you currently employed ? 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 No   Who lives with you? Family    What type of home do you live in: 1 story or 2 story? 1 story       2 stepsons, 1 son       Social Determinants of Radio broadcast assistant Strain: Not on file  Food Insecurity: No Food Insecurity (08/18/2022)   Hunger Vital Sign    Worried About Running Out of Food in the Last Year: Never true    Ran Out of Food in the Last Year: Never true  Transportation Needs: No Transportation Needs (08/18/2022)   PRAPARE - Hydrologist (Medical): No    Lack of Transportation (Non-Medical): No  Physical Activity: Not on file  Stress: Not on file  Social Connections: Not on file  Intimate Partner Violence: Not At Risk (08/12/2022)   Humiliation, Afraid, Rape, and Kick questionnaire    Fear of Current or Ex-Partner: No    Emotionally Abused: No    Physically Abused: No    Sexually Abused: No    Family History  Problem Relation Age of Onset   COPD Mother    Aortic aneurysm Father    COPD Sister    Lung cancer Sister    Colon cancer Neg Hx    Colon polyps Neg Hx    Esophageal cancer Neg Hx    Rectal cancer Neg Hx    Stomach cancer Neg Hx     Review of Systems:  As stated in the HPI and otherwise negative.   There were no vitals taken for this visit.  Physical Examination: General: Well developed, well nourished, NAD  HEENT: OP clear, mucus  membranes moist  SKIN: warm, dry. No rashes. Neuro: No focal deficits  Musculoskeletal: Muscle strength 5/5 all ext  Psychiatric: Mood and affect normal  Neck: No JVD, no carotid bruits, no thyromegaly, no lymphadenopathy.  Lungs:Clear bilaterally, no wheezes, rhonci, crackles Cardiovascular: Regular rate and rhythm. No murmurs, gallops or  rubs. Abdomen:Soft. Bowel sounds present. Non-tender.  Extremities: No lower extremity edema. Pulses are 2 + in the bilateral DP/PT.  EKG:  EKG {ACTION; IS/IS GI:[id number] ordered today. The ekg ordered today demonstrates ***  Echo 08/17/22: 1. Left ventricular ejection fraction, by estimation, is 50 to 55%. The  left ventricle has low normal function. The left ventricle has no regional  wall motion abnormalities. Left ventricular diastolic parameters are  consistent with Grade I diastolic  dysfunction (impaired relaxation).   2. Right ventricular systolic function is normal. The right ventricular  size is normal.   3. The mitral valve is normal in structure. No evidence of mitral valve  regurgitation. No evidence of mitral stenosis.   4. The aortic valve is tricuspid. There is mild calcification of the  aortic valve. Aortic valve regurgitation is not visualized. Aortic valve  sclerosis/calcification is present, without any evidence of aortic  stenosis. Aortic valve area, by VTI measures   2.53 cm. Aortic valve mean gradient measures 6.0 mmHg. Aortic valve Vmax  measures 1.65 m/s.   5. Aortic dilatation noted. There is borderline dilatation of the aortic  root, measuring 38 mm. There is borderline dilatation of the ascending  aorta, measuring 39 mm.   6. The inferior vena cava is normal in size with greater than 50%  respiratory variability, suggesting right atrial pressure of 3 mmHg.   7. Limited images (even with Definity contrast) due to poor sound wave  transmission   Recent Labs: 02/11/2022: TSH 0.900 08/13/2022: ALT 21; Hemoglobin 12.3;  Platelets 193 08/17/2022: BUN 9; Creatinine, Ser 1.00; Magnesium 2.0; Potassium 3.9; Sodium 139   Lipid Panel    Component Value Date/Time   CHOL 215 (H) 02/11/2022 0858   TRIG 174 (H) 02/11/2022 0858   HDL 42 02/11/2022 0858   CHOLHDL 5.1 (H) 02/11/2022 0858   LDLCALC 142 (H) 02/11/2022 0858     Wt Readings from Last 3 Encounters:  09/07/22 97.1 kg  08/22/22 95.4 kg  08/13/22 98.2 kg      Assessment and Plan:   1. Chest pain:***  Labs/ tests ordered today include:  No orders of the defined types were placed in this encounter.  Disposition:   F/U with me in ***    Signed, Lauree Chandler, MD, [facility] 09/11/2022 6:10 PM    [facility] [place], [place], [place]  [address] Phone: [phone]; Fax: [phone]

## 2022-09-12 ENCOUNTER — Telehealth: Payer: Self-pay | Admitting: Anesthesiology

## 2022-09-12 ENCOUNTER — Encounter: Payer: Self-pay | Admitting: Cardiovascular Disease

## 2022-09-12 ENCOUNTER — Ambulatory Visit
Payer: No Typology Code available for payment source | Attending: Cardiovascular Disease | Admitting: Cardiovascular Disease

## 2022-09-12 ENCOUNTER — Telehealth: Payer: Self-pay

## 2022-09-12 VITALS — BP 144/80 | HR 63 | Ht 68.5 in | Wt 214.6 lb

## 2022-09-12 DIAGNOSIS — R079 Chest pain, unspecified: Secondary | ICD-10-CM | POA: Diagnosis not present

## 2022-09-12 DIAGNOSIS — R072 Precordial pain: Secondary | ICD-10-CM

## 2022-09-12 MED ORDER — LEVETIRACETAM 1000 MG PO TABS: ORAL_TABLET | ORAL | 6 refills | Status: DC

## 2022-09-12 MED ORDER — DIAZEPAM 5 MG PO TABS: ORAL_TABLET | ORAL | 0 refills | Status: DC

## 2022-09-12 NOTE — Telephone Encounter (Signed)
Good to hear. Let's start reducing Keppra 1000mg : take 1 tablet in AM, 2 tablets in PM for 1 week, then reduce to 1 tablet twice a day. Pls update Rx list. Thanks

## 2022-09-12 NOTE — Telephone Encounter (Signed)
Spoke with pt wife informed her Dr Delice Lesch stated Good to hear he is doing well . Let's start reducing Keppra 1000mg : take 1 tablet in AM, 2 tablets in PM for 1 week, then reduce to 1 tablet twice a day.

## 2022-09-12 NOTE — Telephone Encounter (Signed)
Pls let them know we usually give Valium 5mg : take 30 minutes prior to MRI. May take second dose if needed. If she is okay with that, I will send in Rx. Thanks

## 2022-09-12 NOTE — Telephone Encounter (Signed)
Pt's wife called stating pt is getting ready to have an MRI tomorrow, pt is requesting something to help him with his nerves/anxiety. States he's taken Ativan before. It needs to go to [facility] on [address] .

## 2022-09-12 NOTE — Patient Instructions (Signed)
Medication Instructions:  No changes *If you need a refill on your cardiac medications before your next appointment, please call your pharmacy*   Lab Work: Today: bmet If you have labs (blood work) drawn today and your tests are completely normal, you will receive your results only by: Reading (if you have MyChart) OR A paper copy in the mail If you have any lab test that is abnormal or we need to change your treatment, we will call you to review the results.   Testing/Procedures: Cardiac CTA - see instructions below   Follow-Up: To be determined after CT results    Your cardiac CT will be scheduled at  [facility] [place], [place] [address] [phone]  Please arrive at the [facility]'s Entrance (Entrance C2) of [facility] 30 minutes prior to test start time. You can use the FREE valet parking offered at entrance C (encouraged to control the heart rate for the test)  Proceed to the [facility] Radiology Department (first floor) to check-in and test prep.  All radiology patients and guests should use entrance C2 at [facility], accessed from [facility], even though the hospital's physical address listed is [address]      Please follow these instructions carefully (unless otherwise directed):  Hold all erectile dysfunction medications at least 3 days (72 hrs) prior to test. (Ie viagra, cialis, sildenafil, tadalafil, etc) We will administer nitroglycerin during this exam.   On the Night Before the Test: Be sure to Drink plenty of water. Do not consume any caffeinated/decaffeinated beverages or chocolate 12 hours prior to your test. Do not take any antihistamines 12 hours prior to your test.  On the Day of the Test: Drink plenty of water until 1 hour prior to the test. Do not eat any food 1 hour prior to test. Do not take metformin the day of the test. You may take your other  regular medications prior to the test.   After the Test: Drink plenty of water. After receiving IV contrast, you may experience a mild flushed feeling. This is normal. On occasion, you may experience a mild rash up to 24 hours after the test. This is not dangerous. If this occurs, you can take Benadryl 25 mg and increase your fluid intake. If you experience trouble breathing, this can be serious. If it is severe call 911 IMMEDIATELY. If it is mild, please call our office. If you take any of these medications: Glipizide/Metformin, Avandament, Glucavance, please do not take 48 hours after completing test unless otherwise instructed.  We will call to schedule your test 2-4 weeks out understanding that some insurance companies will need an authorization prior to the service being performed.   For non-scheduling related questions, please contact the cardiac imaging nurse navigator should you have any questions/concerns: Marchia Bond, Cardiac Imaging Nurse Navigator Gordy Clement, Cardiac Imaging Nurse Navigator [facility] Direct Office Dial: [phone]   For scheduling needs, including cancellations and rescheduling, please call [place], [phone].

## 2022-09-12 NOTE — Telephone Encounter (Signed)
Spoke with pt wife EEG is normal. How is he feeling off clonazepam? He is doing well. He is done with PT at times has little confusion and a hard time finding his words, he gets his information mixed, they worried about his memory? His anxiety is up a little. Voice is back to normal, his walking is good not using a walking stick and he is not sleeping as much. Asking what will be the next medication to wing off of

## 2022-09-12 NOTE — Telephone Encounter (Signed)
Pt wife stated Valium sounds great, she will pick it up from the pharmacy this afternoon

## 2022-09-12 NOTE — Telephone Encounter (Signed)
-----   Message from Cameron Sprang, MD sent at 09/11/2022  6:22 PM EDT ----- Pls let him/wife know the EEG is normal. How is he feeling off clonazepam?

## 2022-09-13 LAB — BASIC METABOLIC PANEL
BUN/Creatinine Ratio: 18 (ref 10–24)
BUN: 15 mg/dL (ref 8–27)
CO2: 21 mmol/L (ref 20–29)
Calcium: 10.2 mg/dL (ref 8.6–10.2)
Chloride: 100 mmol/L (ref 96–106)
Creatinine, Ser: 0.85 mg/dL (ref 0.76–1.27)
Glucose: 129 mg/dL — ABNORMAL HIGH (ref 70–99)
Potassium: 4.3 mmol/L (ref 3.5–5.2)
Sodium: 140 mmol/L (ref 134–144)
eGFR: 99 mL/min/{1.73_m2} (ref >59)

## 2022-09-14 ENCOUNTER — Ambulatory Visit
Admission: RE | Admit: 2022-09-14 | Discharge: 2022-09-14 | Disposition: A | Discharge status: Home / Self Care | Payer: No Typology Code available for payment source | Source: Ambulatory Visit | Attending: Neurology | Admitting: Neurology

## 2022-09-14 DIAGNOSIS — R569 Unspecified convulsions: Secondary | ICD-10-CM

## 2022-09-14 MED ORDER — GADOPICLENOL 0.5 MMOL/ML IV SOLN
10.0000 mL | Freq: Once | INTRAVENOUS | Status: AC | PRN
  Administered 2022-09-14: 10 mL via INTRAVENOUS

## 2022-09-15 ENCOUNTER — Telehealth: Payer: Self-pay

## 2022-09-15 NOTE — Telephone Encounter (Signed)
Pt called an informed that brain MRI looked fine, no tumor, stroke, or bleed. It showed mild hardening of the small blood vessels seen in patients with blood pressure, cholesterol, diabetes issues. These would not cause seizures. Continue current plan with medications

## 2022-09-15 NOTE — Telephone Encounter (Signed)
-----   Message from Cameron Sprang, MD sent at 09/14/2022  9:56 PM EDT ----- Pls let patient/wife know the brain MRI looked fine, no tumor, stroke, or bleed. It showed mild hardening of the small blood vessels seen in patients with blood pressure, cholesterol, diabetes issues. These would not cause seizures. Continue current plan with medications. Thanks

## 2022-09-16 ENCOUNTER — Telehealth (HOSPITAL_COMMUNITY): Payer: Self-pay | Admitting: Emergency Medicine

## 2022-09-16 ENCOUNTER — Other Ambulatory Visit (HOSPITAL_COMMUNITY): Payer: No Typology Code available for payment source

## 2022-09-16 NOTE — Telephone Encounter (Signed)
Reaching out to patient to offer assistance regarding upcoming cardiac imaging study; pt verbalizes understanding of appt date/time, parking situation and where to check in, pre-test NPO status and medications ordered, and verified current allergies; name and call back number provided for further questions should they arise Marchia Bond RN Navigator Cardiac Imaging [facility] [phone] office [phone] cell   Arrival [address] entrance Denies iv issues Daily meds, holding metformin 48 post Aware contrast/nitro

## 2022-09-19 ENCOUNTER — Ambulatory Visit: Payer: BC Managed Care – PPO | Admitting: Nurse Practitioner

## 2022-09-20 ENCOUNTER — Ambulatory Visit (HOSPITAL_COMMUNITY)
Admission: RE | Admit: 2022-09-20 | Discharge: 2022-09-20 | Disposition: A | Discharge status: Home / Self Care | Payer: No Typology Code available for payment source | Source: Ambulatory Visit | Attending: Cardiovascular Disease | Admitting: Cardiovascular Disease

## 2022-09-20 DIAGNOSIS — R072 Precordial pain: Secondary | ICD-10-CM | POA: Insufficient documentation

## 2022-09-20 MED ORDER — NITROGLYCERIN 0.4 MG SL SUBL: 0.8000 mg | SUBLINGUAL_TABLET | Freq: Once | SUBLINGUAL | Status: AC

## 2022-09-20 MED ORDER — METOPROLOL TARTRATE 5 MG/5ML IV SOLN
INTRAVENOUS | Status: AC
  Administered 2022-09-20: 10 mg via INTRAVENOUS
  Filled 2022-09-20: qty 10

## 2022-09-20 MED ORDER — IOHEXOL 350 MG/ML SOLN
95.0000 mL | Freq: Once | INTRAVENOUS | Status: AC | PRN
  Administered 2022-09-20: 95 mL via INTRAVENOUS

## 2022-09-20 MED ORDER — NITROGLYCERIN 0.4 MG SL SUBL
SUBLINGUAL_TABLET | SUBLINGUAL | Status: AC
  Administered 2022-09-20: 0.8 mg via SUBLINGUAL
  Filled 2022-09-20: qty 2

## 2022-09-20 MED ORDER — METOPROLOL TARTRATE 5 MG/5ML IV SOLN: 10.0000 mg | Freq: Once | INTRAVENOUS | Status: AC

## 2022-09-22 ENCOUNTER — Other Ambulatory Visit: Payer: Self-pay | Admitting: Internal Medicine

## 2022-09-22 NOTE — Telephone Encounter (Signed)
Prescription Request  09/22/2022  LOV: 09/07/2022  What is the name of the medication or equipment? divalproex (DEPAKOTE) 500 MG DR tablet  levETIRAcetam (KEPPRA) 1000 MG tablet   Have you contacted your pharmacy to request a refill? Yes   Which pharmacy would you like this sent to?   [facility], [place] [address] Phone: [phone] Fax: [phone]     Patient notified that their request is being sent to the clinical staff for review and that they should receive a response within 2 business days.   Please advise at Mobile [phone] (mobile)

## 2022-09-29 ENCOUNTER — Telehealth: Payer: Self-pay | Admitting: Internal Medicine

## 2022-09-29 DIAGNOSIS — E1159 Type 2 diabetes mellitus with other circulatory complications: Secondary | ICD-10-CM

## 2022-09-29 MED ORDER — AMLODIPINE BESYLATE-VALSARTAN 5-160 MG PO TABS
ORAL_TABLET | ORAL | 3 refills | Status: DC
Start: 2022-09-29 — End: 2023-10-02

## 2022-09-29 NOTE — Telephone Encounter (Signed)
Prescription Request  09/29/2022  LOV: 09/07/2022  What is the name of the medication or equipment? amLODipine-valsartan (EXFORGE) 5-160 MG tablet   Have you contacted your pharmacy to request a refill? No   Which pharmacy would you like this sent to?  [facility] - [place], [place] - [address] [place] [address] Phone: [phone] Fax: [phone]   Patient notified that their request is being sent to the clinical staff for review and that they should receive a response within 2 business days.   Please advise at Mobile [phone] (mobile)

## 2022-09-29 NOTE — Telephone Encounter (Signed)
Rx sent electronically.  

## 2022-10-04 ENCOUNTER — Ambulatory Visit: Payer: No Typology Code available for payment source | Admitting: Family

## 2022-10-10 ENCOUNTER — Telehealth: Payer: Self-pay | Admitting: Cardiovascular Disease

## 2022-10-10 DIAGNOSIS — I77819 Aortic ectasia, unspecified site: Secondary | ICD-10-CM

## 2022-10-10 NOTE — Telephone Encounter (Signed)
-----   Message from [name], MD sent at 09/25/2022  2:04 PM EDT ----- Mild non-obstructive CAD. His chest pain is not cardiac related. He will need to continue ASA and statin. Mild dilation of the ascending aorta. Will need repeat chest CTA in one year to follow the ascending aorta. Russell Robles

## 2022-10-10 NOTE — Telephone Encounter (Signed)
Left message on voicemail per DPR. Order for repeat CTA of Aorta to be done in 1 year, placed at this time.

## 2022-10-19 ENCOUNTER — Encounter: Payer: Self-pay | Admitting: Neurology

## 2022-10-31 ENCOUNTER — Other Ambulatory Visit: Payer: Self-pay | Admitting: Internal Medicine

## 2022-10-31 DIAGNOSIS — E785 Hyperlipidemia, unspecified: Secondary | ICD-10-CM

## 2022-10-31 NOTE — Telephone Encounter (Signed)
Refill request Simvastatin Prescribed by another provider Last office visit 09/07/22 Upcoming appointment 11/10/22 Last lipid 02/11/22 Last refill 02/08/22 #90/1

## 2022-11-02 ENCOUNTER — Ambulatory Visit: Payer: No Typology Code available for payment source | Admitting: Neurology

## 2022-11-08 DIAGNOSIS — Z0279 Encounter for issue of other medical certificate: Secondary | ICD-10-CM

## 2022-11-10 ENCOUNTER — Ambulatory Visit: Payer: No Typology Code available for payment source | Admitting: Neurology

## 2022-11-10 ENCOUNTER — Telehealth: Payer: Self-pay

## 2022-11-10 ENCOUNTER — Encounter: Payer: Self-pay | Admitting: Internal Medicine

## 2022-11-10 ENCOUNTER — Encounter: Payer: Self-pay | Admitting: Neurology

## 2022-11-10 ENCOUNTER — Ambulatory Visit: Payer: No Typology Code available for payment source | Admitting: Internal Medicine

## 2022-11-10 VITALS — BP 132/82 | HR 56 | Temp 98.0°F | Ht 68.5 in | Wt 223.0 lb

## 2022-11-10 VITALS — BP 121/65 | HR 64 | Resp 20 | Ht 68.0 in | Wt 224.0 lb

## 2022-11-10 DIAGNOSIS — E1142 Type 2 diabetes mellitus with diabetic polyneuropathy: Secondary | ICD-10-CM

## 2022-11-10 DIAGNOSIS — R569 Unspecified convulsions: Secondary | ICD-10-CM

## 2022-11-10 DIAGNOSIS — Z7985 Long-term (current) use of injectable non-insulin antidiabetic drugs: Secondary | ICD-10-CM | POA: Diagnosis not present

## 2022-11-10 DIAGNOSIS — Z7984 Long term (current) use of oral hypoglycemic drugs: Secondary | ICD-10-CM

## 2022-11-10 DIAGNOSIS — I152 Hypertension secondary to endocrine disorders: Secondary | ICD-10-CM

## 2022-11-10 DIAGNOSIS — E1159 Type 2 diabetes mellitus with other circulatory complications: Secondary | ICD-10-CM | POA: Diagnosis not present

## 2022-11-10 LAB — POCT GLYCOSYLATED HEMOGLOBIN (HGB A1C): Hemoglobin A1C: 7.4 % — AB (ref 4.0–5.6)

## 2022-11-10 NOTE — Progress Notes (Signed)
Subjective:    Patient ID: Russell Robles, male    DOB: 1960/11/10, 62 y.o.   MRN: [id number]  HPI Here for follow up of diabetes and other chronic conditions With wife  Has Dexcom CGM Several low measures-- as low as 60. No symptoms Highest was 215--but after eating No night alarms Feet still burn on the bottom--variable  Careful with eating Up some now  Going back to Dr Karel Jarvis today No seizures  Has noted some foot swelling Most noticeable when extended time in car Variable otherwise No chest pain--other than "nerves" pain he has had Breathing is okay  Current Outpatient Medications on File Prior to Visit  Medication Sig Dispense Refill   amLODipine-valsartan (EXFORGE) 5-160 MG tablet TAKE 1 TABLET BY MOUTH DAILY. (TAKE WITH HCTZ 12.5 MG TABLET) 90 tablet 3   aspirin EC 81 MG tablet Take 81 mg by mouth daily. Swallow whole.     Cholecalciferol (VITAMIN D3) 125 MCG (5000 UT) TABS 5,000 IU OTC vitamin D3 daily. (Patient taking differently: Take 5,000 Units by mouth daily at 12 noon. 5,000 IU OTC vitamin D3 daily.) 90 tablet 3   Continuous Blood Gluc Sensor (DEXCOM G7 SENSOR) MISC Use as directed 4 each 3   Cyanocobalamin (VITAMIN B12 PO) Take 1 tablet by mouth daily.     diazepam (VALIUM) 5 MG tablet Take 1 tablet 30 minutes prior to MRI. May take second dose if needed. 2 tablet 0   divalproex (DEPAKOTE) 500 MG DR tablet Take 1 tablet (500 mg total) by mouth 2 (two) times daily. 180 tablet 2   Dulaglutide (TRULICITY) 1.5 MG/0.5ML SOPN Inject 1.5 mg into the skin once a week. 2 mL 11   Insulin Glargine (BASAGLAR KWIKPEN) 100 UNIT/ML Inject 25 Units into the skin daily. 15 mL 11   Insulin Pen Needle 31G X 8 MM MISC Use with Insulin Glargine to inject 25 units daily 100 each 0   Lacosamide 100 MG TABS Take 1 tablet (100 mg total) by mouth 2 (two) times daily. 180 tablet 3   levETIRAcetam (KEPPRA) 1000 MG tablet take 1 tablet in AM, 2 tablets in PM for 1 week, then reduce to  1 tablet twice a day. 120 tablet 6   metFORMIN (GLUCOPHAGE) 1000 MG tablet Take 1 tablet (1,000 mg total) by mouth 2 (two) times daily with a meal. 180 tablet 3   nitroGLYCERIN (NITROSTAT) 0.4 MG SL tablet Place 1 tablet (0.4 mg total) under the tongue every 5 (five) minutes as needed for chest pain. If a third tablet is needed please call 911 or go to the emergency department. 30 tablet 0   Potassium 99 MG TABS Take by mouth.     simvastatin (ZOCOR) 80 MG tablet TAKE 1 TABLET (80 MG TOTAL) BY MOUTH AT BEDTIME. 90 tablet 3   No current facility-administered medications on file prior to visit.    No Known Allergies  Past Medical History:  Diagnosis Date   Anxiety    Arthritis    Cough    PER PT NON-PRODUCTIVE , NO FEVER OR CONGESTION   Hernia, inguinal, right    Hyperlipidemia    Hypertension    Type 2 diabetes mellitus ([name])    last  HbA1c 7.2 on 05-09-2017 in epic    Past Surgical History:  Procedure Laterality Date   CARPAL TUNNEL RELEASE Left 08/02/2018   Procedure: CARPAL TUNNEL RELEASE;  Surgeon: Juanell Fairly, MD;  Location: [facility];  Service: Orthopedics;  Laterality: Left;  HERNIA REPAIR N/A    Phreesia 05/18/2020   INGUINAL HERNIA REPAIR Left 12-16-2010  dr Dwain Sarna  [facility]   INGUINAL HERNIA REPAIR Right 06/01/2017   Procedure: OPEN RIGHT INGUINAL HERNIA REPAIR WITH MESH;  Surgeon: Kinsinger, De Blanch, MD;  Location: [facility] [place];  Service: General;  Laterality: Right;  GENERAL AND TAP BLOCK   INSERTION OF MESH Right 06/01/2017   Procedure: INSERTION OF MESH;  Surgeon: Kinsinger, De Blanch, MD;  Location: [facility] [place];  Service: General;  Laterality: Right;  GENERAL AND TAP BLOCK    Family History  Problem Relation Age of Onset   COPD Mother    Aortic aneurysm Father    COPD Sister    Lung cancer Sister    Colon cancer Neg Hx    Colon polyps Neg Hx    Esophageal cancer Neg Hx    Rectal cancer Neg Hx    Stomach cancer Neg Hx      Social History   Socioeconomic History   Marital status: Married    Spouse name: Not on file   Number of children: 3   Years of education: Not on file   Highest education level: Not on file  Occupational History   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 Right handed    Are you currently employed ? 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 No   Who lives with you? Family    What type of home do you live in: 1 story or 2 story? 1 story       2 stepsons, 1 son       Social Determinants of Corporate investment banker Strain: Not on file  Food Insecurity: No Food Insecurity (08/18/2022)   Hunger Vital Sign    Worried About Running Out of Food in the Last Year: Never true    Ran Out of Food in the Last Year: Never true  Transportation Needs: No Transportation Needs (08/18/2022)   PRAPARE - Administrator, Civil Service (Medical): No    Lack of Transportation (Non-Medical): No  Physical Activity: Not on file  Stress: Not on file  Social Connections: Not on file  Intimate Partner Violence: Not At Risk (08/12/2022)   Humiliation, Afraid, Rape, and Kick questionnaire    Fear of Current or Ex-Partner: No    Emotionally Abused: No    Physically Abused: No    Sexually Abused: No   Review of Systems Sleeping okay No abdminal problems     Objective:   Physical Exam Constitutional:      Appearance: Normal appearance.  Cardiovascular:     Rate and Rhythm: Normal  rate and regular rhythm.     Pulses: Normal pulses.     Heart sounds:     No gallop.     Comments: Soft aortic systolic murmur Pulmonary:  Effort: Pulmonary effort is normal.     Breath sounds: Normal breath sounds. No wheezing or rales.  Musculoskeletal:     Cervical back: Neck supple.  Lymphadenopathy:     Cervical: No cervical adenopathy.  Skin:    Comments: No foot lesions  Neurological:     Mental Status: He is alert.            Assessment & Plan:

## 2022-11-10 NOTE — Progress Notes (Signed)
NEUROLOGY FOLLOW UP OFFICE NOTE  Russell Robles [id number] [birth year]  HISTORY OF PRESENT ILLNESS: I had the pleasure of seeing Russell Robles in follow-up in the neurology clinic on 11/10/2022.  The patient was last seen 2 months ago after admission for new onset focal status epilepticus in 07/2022. He was discharged home on 4 ASMs with significant drowsiness and gait instability on initial visit. He was weaned off clonazepam, Levetiracetam dose reduced to 1000mg  BID. He continues on Depakote 500mg  BID and Lacosamide 100mg  BID. No seizures since 08/13/22. He is much more alert today. They report walking and balance are better, no falls. He has had some tremors when reaching for something or doing something. He denies any episodes of vision changes/sensation of movement. He still has some cognitive changes, he forgot their zip code and phone number one time. He has difficulty finding words. No staring/unresponsiveness. He is sleeping well, no further daytime drowsiness. He has started mowing the lawn. Mood is okay. They report glucose levels are better, last HbA1c was 7.4.  Repeat brain MRI with and without contrast 08/2022 no acute changes, mild chronic microvascular disease, mild diffuse atrophy. 1-hour wake and sleep EEG in 08/2022 normal.   History on Initial Assessment 08/22/2022: This is a 62 year old right-handed man with a history of DM2, hypertension, hyperlipidemia, in his usual state of health until mid-February when he started having episodes of dizziness with transient blurred vision. He would feel that everything from mid-point would move to the right for 3-5 minutes, then start over again, lasting 10-15 minutes. He is able to comprehend but would not form the sentences he wanted to. He went to the ER for these symptoms on 08/04/22 and reported recently stopping Ozempic and starting Janumet a few days prior. Glucose was 510. Head CT no acute changes, there was an old left basal ganglia lacunar  infarct and diffuse atrophy and chronic microvascular disease. On his PCP visit on 08/10/22, they reported moderate confusion, he was forgetting simple instructions, words, thought process changes completely, forgetting thought mid-sentence. He went to the ER later that day and was seen by Neurology, he reported a sensation of his vision moving to the right continuously, his wife reported freezing with a blank stare lasting seconds to minutes. He was witnessed to have 2 episodes of head and eye turn to the right, unresponsive, with lip smacking lasting 10-15 seconds. There was increased tone on right upper extremity during them. Limited MRI brain no acute changes on DWI imaging. MRA normal. Overnight EEG 2/22-2/23 showed focal seizures arising from the left parieto-occipital region, he had an average for 3 seizures per hour lasting 2-4 minutes. He was initially on Levetiracetam increased to 2000mg  BID, then Vimpat 100mg  BID added due to continued seizures, then clonazepam 1mg  TID as he continued to have seizures on 2/23-2/24 vEEG monitoring. Depakote 500mg  BID added on. EEG from 2/24-2/25 captured 4 focal seizures, last was on 2/24 at 3pm, then none for the next 24 hours. Seizures felt to be provoked seizures in the setting of hyperglycemia. He had a repeat brain MRI without contrast on 08/15/22 which was normal.  He and his wife deny any further seizures since his hospitalization, however Rosey Bath notes that he is very unstable, speech is different, and there is still confusion but different than the seizures. Speech is slower, softer, mumbling at times. He closes his eyes a lot and says his eyes are tired. He sleeps majority of the day, getting up for an  hour then going back to sleep. He sleeps through the night. He states he can carry on conversations but feels weak, he uses a cane to get up. He is dropping things all the time. His wife has to help him up sometimes. He denies any olfactory/gustatory hallucinations,  focal numbness/tingling/weakness, myoclonic jerks. No headaches, dizziness, no further vision changes, neck pain/back pain, bowel dysfunction. He has some urinary urgency and sometimes incontinence. Glucose levels have improved, up to 200 but mostly between 110-120 at home. No alcohol use. He lives with his wife. He had been doing maintenance work prior to the seizures. He had a normal birth and early development.  There is no history of febrile convulsions, CNS infections such as meningitis/encephalitis, significant traumatic brain injury, neurosurgical procedures, or family history of seizures.   PAST MEDICAL HISTORY: Past Medical History:  Diagnosis Date   Anxiety    Arthritis    Cough    PER PT NON-PRODUCTIVE , NO FEVER OR CONGESTION   Hernia, inguinal, right    Hyperlipidemia    Hypertension    Type 2 diabetes mellitus ([name])    last  HbA1c 7.2 on 05-09-2017 in epic    MEDICATIONS: Current Outpatient Medications on File Prior to Visit  Medication Sig Dispense Refill   aspirin EC 81 MG tablet Take 81 mg by mouth daily. Swallow whole.     Cholecalciferol (VITAMIN D3) 125 MCG (5000 UT) TABS 5,000 IU OTC vitamin D3 daily. (Patient taking differently: Take 5,000 Units by mouth daily at 12 noon. 5,000 IU OTC vitamin D3 daily.) 90 tablet 3   Continuous Blood Gluc Sensor (DEXCOM G7 SENSOR) MISC Use as directed 4 each 3   Cyanocobalamin (VITAMIN B12 PO) Take 1 tablet by mouth daily.     divalproex (DEPAKOTE) 500 MG DR tablet Take 1 tablet (500 mg total) by mouth 2 (two) times daily. 180 tablet 2   Dulaglutide (TRULICITY) 1.5 MG/0.5ML SOPN Inject 1.5 mg into the skin once a week. 2 mL 11   Insulin Glargine (BASAGLAR KWIKPEN) 100 UNIT/ML Inject 25 Units into the skin daily. 15 mL 11   Insulin Pen Needle 31G X 8 MM MISC Use with Insulin Glargine to inject 25 units daily 100 each 0   Lacosamide 100 MG TABS Take 1 tablet (100 mg total) by mouth 2 (two) times daily. 180 tablet 3   levETIRAcetam  (KEPPRA) 1000 MG tablet take 1 tablet in AM, 2 tablets in PM for 1 week, then reduce to 1 tablet twice a day. 120 tablet 6   metFORMIN (GLUCOPHAGE) 1000 MG tablet Take 1 tablet (1,000 mg total) by mouth 2 (two) times daily with a meal. 180 tablet 3   nitroGLYCERIN (NITROSTAT) 0.4 MG SL tablet Place 1 tablet (0.4 mg total) under the tongue every 5 (five) minutes as needed for chest pain. If a third tablet is needed please call 911 or go to the emergency department. 30 tablet 0   Potassium 99 MG TABS Take by mouth.     simvastatin (ZOCOR) 80 MG tablet TAKE 1 TABLET (80 MG TOTAL) BY MOUTH AT BEDTIME. 90 tablet 3   amLODipine-valsartan (EXFORGE) 5-160 MG tablet TAKE 1 TABLET BY MOUTH DAILY. (TAKE WITH HCTZ 12.5 MG TABLET) (Patient not taking: Reported on 11/10/2022) 90 tablet 3   diazepam (VALIUM) 5 MG tablet Take 1 tablet 30 minutes prior to MRI. May take second dose if needed. (Patient not taking: Reported on 11/10/2022) 2 tablet 0   No current facility-administered medications  on file prior to visit.    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 Right handed    Are you currently employed ? 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 No   Who lives with you? Family    What type of home do you live in: 1 story or 2 story? 1 story       2 stepsons, 1 son       Social Determinants of Corporate investment banker Strain: Not on file  Food Insecurity: No Food Insecurity (08/18/2022)   Hunger Vital Sign    Worried About Running Out of Food in the Last Year: Never true    Ran Out of Food in the Last Year: Never true  Transportation Needs: No Transportation Needs (08/18/2022)   PRAPARE - Administrator, Civil Service (Medical): No    Lack of Transportation (Non-Medical): No  Physical Activity: Not on file  Stress: Not on file  Social Connections: Not on file  Intimate Partner Violence: Not At Risk (08/12/2022)   Humiliation, Afraid, Rape, and Kick questionnaire    Fear of Current or Ex-Partner: No    Emotionally Abused: No    Physically Abused: No    Sexually Abused: No     PHYSICAL EXAM: Vitals:   11/10/22 1110  BP: 121/65  Pulse: 64  Resp: 20  SpO2: 96%   General: No acute distress Head:  Normocephalic/atraumatic Skin/Extremities: No rash, no edema Neurological Exam: alert and awake. No aphasia or dysarthria. Fund of knowledge is appropriate.  Attention and concentration are normal.   Cranial nerves: Pupils equal, round. Extraocular movements intact with no nystagmus. Visual fields full.  No facial asymmetry.  Motor: Bulk and tone normal, muscle strength 5/5 throughout with no pronator drift.   Finger to nose testing intact.  Gait narrow-based and steady, much improved compared to prior visit. No postural or resting tremor, mild endpoint tremor bilaterally.    IMPRESSION: This is a 62 yo RH man with a history of DM2,  hypertension, hyperlipidemia, with new onset focal status epilepticus in February. EEG showed focal seizures arising from the left parieto-occipital region. He has been  seizure-free since 08/13/22. Repeat brain MRI and EEG unremarkable. Drowsiness and ataxia much improved with medication adjustment. He is having tremors, likely due to Depakote. He was given weaning instructions for Depakote. Continue Levetiracetam 1000mg  BID and Lacosamide 100mg  BID. Continue glucose control. He is aware of [place] driving laws to stop driving after a seizure until 6 months seizure-free. Follow-up in 3 months, call for any changes.    Thank you for allowing me to participate in his care.  Please do not hesitate to call for any questions or concerns.    Patrcia Dolly, M.D.   CC: Dr. Alphonsus Sias

## 2022-11-10 NOTE — Assessment & Plan Note (Signed)
BP Readings from Last 3 Encounters:  11/10/22 132/82  09/20/22 (!) 164/91  09/12/22 (!) 144/80   Good control on amlodipine/valsartan--- 5/160 If worsening edema--will change the amlodipine to HCTZ

## 2022-11-10 NOTE — Telephone Encounter (Signed)
PA initiated via Covermymeds; KEY: [id number].  PA approved.   Your PA request has been approved. Additional information will be provided in the approval communication. (Message [id number]) Authorization Expiration Date: 11/09/2025

## 2022-11-10 NOTE — Assessment & Plan Note (Signed)
Lab Results  Component Value Date   HGBA1C 7.4 (A) 11/10/2022   Much better now On trulicity 1.5mg  weekly, basaglar 25 daily, metformin 1000 bid Consider increase trulicity Consider gabapentin is neuropathy worsens

## 2022-11-10 NOTE — Assessment & Plan Note (Signed)
No recurrence Will work with Dr Karel Jarvis on further weaning and driving/work issues

## 2022-11-10 NOTE — Patient Instructions (Addendum)
Good to see you doing better.  Start Depakote 500mg : take 1/2 tablet in AM, 1 tablet in PM for 1 week, then reduce to 1/2 tablet twice a day, then reduce to 1/2 tablet every night for 1 week, then stop   2. Continue Levetiracetam 1000mg  twice a day and Lacosamide 100mg  twice a day  3. Follow-up in 3 months, call for any changes   Seizure Precautions: 1. If medication has been prescribed for you to prevent seizures, take it exactly as directed.  Do not stop taking the medicine without talking to your doctor first, even if you have not had a seizure in a long time.   2. Avoid activities in which a seizure would cause danger to yourself or to others.  Don't operate dangerous machinery, swim alone, or climb in high or dangerous places, such as on ladders, roofs, or girders.  Do not drive unless your doctor says you may.  3. If you have any warning that you may have a seizure, lay down in a safe place where you can't hurt yourself.    4.  No driving for 6 months from last seizure, as per [facility].   Please refer to the following link on the Epilepsy Foundation of America's website for more information: http://www.epilepsyfoundation.org/answerplace/Social/driving/drivingu.cfm   5.  Maintain good sleep hygiene. Avoid alcohol.  6.  Contact your doctor if you have any problems that may be related to the medicine you are taking.  7.  Call 911 and bring the patient back to the ED if:        A.  The seizure lasts longer than 5 minutes.       B.  The patient doesn't awaken shortly after the seizure  C.  The patient has new problems such as difficulty seeing, speaking or moving  D.  The patient was injured during the seizure  E.  The patient has a temperature over 102 F (39C)  F.  The patient vomited and now is having trouble breathing

## 2022-11-21 ENCOUNTER — Telehealth: Payer: Self-pay | Admitting: Internal Medicine

## 2022-11-21 MED ORDER — INSULIN PEN NEEDLE 31G X 8 MM MISC: 4 refills | Status: DC

## 2022-11-21 NOTE — Telephone Encounter (Signed)
Prescription Request  11/21/2022  LOV: 11/10/2022  What is the name of the medication or equipment?  Insulin Pen Needle 31G X 8 MM MISC   Have you contacted your pharmacy to request a refill? No   Which pharmacy would you like this sent to?  [facility] - [place], [place] - [address] [place] [address] Phone: [phone] Fax: [phone]   Patient notified that their request is being sent to the clinical staff for review and that they should receive a response within 2 business days.   Please advise at Mobile [phone] (mobile)

## 2022-11-21 NOTE — Telephone Encounter (Signed)
Rx sent electronically.  

## 2022-11-22 DIAGNOSIS — Z0279 Encounter for issue of other medical certificate: Secondary | ICD-10-CM

## 2022-11-23 MED ORDER — LEVETIRACETAM 1000 MG PO TABS: ORAL_TABLET | ORAL | 3 refills | Status: DC

## 2022-11-23 MED ORDER — LACOSAMIDE 100 MG PO TABS: 100.0000 mg | ORAL_TABLET | Freq: Two times a day (BID) | ORAL | 3 refills | Status: DC

## 2022-12-12 ENCOUNTER — Telehealth: Payer: Self-pay

## 2022-12-12 NOTE — Telephone Encounter (Signed)
Called and spoke to wife. We made an OV 12-27-22. IN the meantime, she is going to call the insurance, again, and inform them Dr Alphonsus Sias treated him after the hospital stay and he was a new pt. She really feels Dr Alphonsus Sias does not need to do them. He has a Insurance account manager.

## 2022-12-12 NOTE — Telephone Encounter (Signed)
Spoke to pt's wife. After his hospital stay in February for Seizures, he was not allowed to drive for 6 months. He sees a neurologist who has done the paperwork. The insurance company is wanting Dr Alphonsus Sias to do the forms, as well, before they will pay him.  Says Dr Alphonsus Sias is treating his diabetes and it was thought that he had seizures due to his previous PCP not filling his diabetic meds for a few months.

## 2022-12-13 ENCOUNTER — Ambulatory Visit: Payer: No Typology Code available for payment source | Admitting: Internal Medicine

## 2022-12-27 ENCOUNTER — Ambulatory Visit (INDEPENDENT_AMBULATORY_CARE_PROVIDER_SITE_OTHER): Payer: No Typology Code available for payment source | Admitting: Internal Medicine

## 2022-12-27 ENCOUNTER — Encounter: Payer: Self-pay | Admitting: Internal Medicine

## 2022-12-27 VITALS — BP 130/86 | HR 60 | Temp 98.1°F | Ht 68.5 in | Wt 221.0 lb

## 2022-12-27 DIAGNOSIS — R569 Unspecified convulsions: Secondary | ICD-10-CM | POA: Diagnosis not present

## 2022-12-27 NOTE — Assessment & Plan Note (Signed)
Still on disability until cleared to drive --due to hyperglycemic seizures Reviewed and filled out disability forms

## 2022-12-27 NOTE — Progress Notes (Signed)
Subjective:    Patient ID: Russell Robles, male    DOB: March 14, 1961, 62 y.o.   MRN: [id number]  HPI Here to review his disability issues and handle paperwork With wife  Off keppra--but still on lacosamide Shakes a bit at times--but does okay with ADLs and housework, etc Still not allowed to drive (and not supposed to climb ladders, etc) till cleared by neurology  Current Outpatient Medications on File Prior to Visit  Medication Sig Dispense Refill   amLODipine-valsartan (EXFORGE) 5-160 MG tablet TAKE 1 TABLET BY MOUTH DAILY. (TAKE WITH HCTZ 12.5 MG TABLET) 90 tablet 3   aspirin EC 81 MG tablet Take 81 mg by mouth daily. Swallow whole.     Cholecalciferol (VITAMIN D3) 125 MCG (5000 UT) TABS 5,000 IU OTC vitamin D3 daily. (Patient taking differently: Take 5,000 Units by mouth daily at 12 noon. 5,000 IU OTC vitamin D3 daily.) 90 tablet 3   Continuous Blood Gluc Sensor (DEXCOM G7 SENSOR) MISC Use as directed 4 each 3   Cyanocobalamin (VITAMIN B12 PO) Take 1 tablet by mouth daily.     diazepam (VALIUM) 5 MG tablet Take 1 tablet 30 minutes prior to MRI. May take second dose if needed. 2 tablet 0   Dulaglutide (TRULICITY) 1.5 MG/0.5ML SOPN Inject 1.5 mg into the skin once a week. 2 mL 11   Insulin Glargine (BASAGLAR KWIKPEN) 100 UNIT/ML Inject 25 Units into the skin daily. 15 mL 11   Insulin Pen Needle 31G X 8 MM MISC Use with Insulin Glargine to inject 25 units daily 100 each 4   Lacosamide 100 MG TABS Take 1 tablet (100 mg total) by mouth 2 (two) times daily. 180 tablet 3   metFORMIN (GLUCOPHAGE) 1000 MG tablet Take 1 tablet (1,000 mg total) by mouth 2 (two) times daily with a meal. 180 tablet 3   nitroGLYCERIN (NITROSTAT) 0.4 MG SL tablet Place 1 tablet (0.4 mg total) under the tongue every 5 (five) minutes as needed for chest pain. If a third tablet is needed please call 911 or go to the emergency department. 30 tablet 0   Potassium 99 MG TABS Take by mouth.     simvastatin (ZOCOR) 80  MG tablet TAKE 1 TABLET (80 MG TOTAL) BY MOUTH AT BEDTIME. 90 tablet 3   No current facility-administered medications on file prior to visit.    No Known Allergies  Past Medical History:  Diagnosis Date   Anxiety    Arthritis    Cough    PER PT NON-PRODUCTIVE , NO FEVER OR CONGESTION   Hernia, inguinal, right    Hyperlipidemia    Hypertension    Type 2 diabetes mellitus ([name])    last  HbA1c 7.2 on 05-09-2017 in epic    Past Surgical History:  Procedure Laterality Date   CARPAL TUNNEL RELEASE Left 08/02/2018   Procedure: CARPAL TUNNEL RELEASE;  Surgeon: Juanell Fairly, MD;  Location: [facility];  Service: Orthopedics;  Laterality: Left;   HERNIA REPAIR N/A    Phreesia 05/18/2020   INGUINAL HERNIA REPAIR Left 12-16-2010  dr Dwain Sarna  [facility]   INGUINAL HERNIA REPAIR Right 06/01/2017   Procedure: OPEN RIGHT INGUINAL HERNIA REPAIR WITH MESH;  Surgeon: Kinsinger, De Blanch, MD;  Location: [facility] [place];  Service: General;  Laterality: Right;  GENERAL AND TAP BLOCK   INSERTION OF MESH Right 06/01/2017   Procedure: INSERTION OF MESH;  Surgeon: Kinsinger, De Blanch, MD;  Location: [facility] [place];  Service: General;  Laterality: Right;  GENERAL AND TAP BLOCK    Family History  Problem Relation Age of Onset   COPD Mother    Aortic aneurysm Father    COPD Sister    Lung cancer Sister    Colon cancer Neg Hx    Colon polyps Neg Hx    Esophageal cancer Neg Hx    Rectal cancer Neg Hx    Stomach cancer Neg Hx     Social History   Socioeconomic History   Marital status: Married    Spouse name: Not on file   Number of children: 3   Years of education: Not on file   Highest education level: Not on file  Occupational History   Occupation: Buidling maintenance    Comment: Geophysicist/field seismologist  Tobacco Use   Smoking status: Never    Passive exposure: Past   Smokeless tobacco: Never  Vaping Use   Vaping Use: Never used  Substance and Sexual  Activity   Alcohol use: Not Currently    Comment: occasional beer   Drug use: No   Sexual activity: Yes    Partners: Female    Birth control/protection: None  Other Topics Concern   Not on file  Social History Narrative   Are you right handed or left handed? Right handed    Are you currently employed ? 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 No   Who lives with you? Family    What type of home do you live in: 1 story or 2 story? 1 story       2 stepsons, 1 son       Social Determinants of Corporate investment banker Strain: Not on file  Food Insecurity: No Food Insecurity (08/18/2022)   Hunger Vital Sign    Worried About Running Out of Food in the Last Year: Never true    Ran Out of Food in the Last Year: Never true  Transportation Needs: No Transportation Needs (08/18/2022)   PRAPARE - Administrator, Civil Service (Medical): No    Lack of Transportation (Non-Medical): No  Physical Activity: Not on file  Stress: Not on file  Social Connections: Not on file  Intimate Partner Violence: Not At Risk (08/12/2022)   Humiliation, Afraid, Rape, and Kick questionnaire    Fear of Current or Ex-Partner: No    Emotionally Abused: No    Physically Abused: No    Sexually Abused: No   Review of Systems     Objective:   Physical Exam Neurological:     General: No focal deficit present.  Psychiatric:        Mood and Affect: Mood normal.        Behavior: Behavior normal.            Assessment & Plan:

## 2023-01-27 ENCOUNTER — Ambulatory Visit (INDEPENDENT_AMBULATORY_CARE_PROVIDER_SITE_OTHER): Payer: No Typology Code available for payment source | Admitting: Neurology

## 2023-01-27 ENCOUNTER — Encounter: Payer: Self-pay | Admitting: Neurology

## 2023-01-27 VITALS — BP 147/83 | HR 66 | Ht 68.0 in | Wt 225.8 lb

## 2023-01-27 DIAGNOSIS — R569 Unspecified convulsions: Secondary | ICD-10-CM

## 2023-01-27 MED ORDER — LACOSAMIDE 100 MG PO TABS: 100.0000 mg | ORAL_TABLET | Freq: Two times a day (BID) | ORAL | 3 refills | Status: DC

## 2023-01-27 MED ORDER — LEVETIRACETAM 1000 MG PO TABS: ORAL_TABLET | ORAL | 3 refills | Status: DC

## 2023-01-27 NOTE — Progress Notes (Signed)
NEUROLOGY FOLLOW UP OFFICE NOTE  Russell Robles [id number] 22-Nov-1960  HISTORY OF PRESENT ILLNESS: I had the pleasure of seeing Russell Robles in follow-up in the neurology clinic on 01/27/2023.  The patient was last seen 2 months ago for seizures. He had new onset focal status epilepticus in 07/2022. MRI brain normal. Seizures felt to be provoked in the setting of hyperglycemia. He is again accompanied by his wife who helps supplement the history today.  Records and images were personally reviewed where available.  On his last visit, he reported doing well with weaning off of clonazepam. He was reporting tremors, we weaned off Depakote. Last dose early June. He continues on Lacosamide 100mg  BID and Levetiracetam 1000mg  BID. He denies any seizures or seizure-like symptoms since 08/13/22. He still has occasional tremors but it does not affect daily activities. His wife notes he gets wiped out easily. He still notices that when he is trying to complete a sentence sometimes, he stops and has to go back and get it. His wife states it is not necessarily confusion, they were at the drive-thru the other night and he knew what he wanted to order, but at the window when put on the spot he got flustered. He can get confused easily. Sometimes she sees him when working out in the yard sitting or staring, not doing anything. No unresponsive episodes. He denies any headaches. Sometimes when sitting his head spins a little, this has happened 1 or 2 times since last visit. His balance is back, no falls. Glucose levels much better, usually less than 200.    History on Initial Assessment 08/22/2022: This is a 62 year old right-handed man with a history of DM2, hypertension, hyperlipidemia, in his usual state of health until mid-February when he started having episodes of dizziness with transient blurred vision. He would feel that everything from mid-point would move to the right for 3-5 minutes, then start over again, lasting  10-15 minutes. He is able to comprehend but would not form the sentences he wanted to. He went to the ER for these symptoms on 08/04/22 and reported recently stopping Ozempic and starting Janumet a few days prior. Glucose was 510. Head CT no acute changes, there was an old left basal ganglia lacunar infarct and diffuse atrophy and chronic microvascular disease. On his PCP visit on 08/10/22, they reported moderate confusion, he was forgetting simple instructions, words, thought process changes completely, forgetting thought mid-sentence. He went to the ER later that day and was seen by Neurology, he reported a sensation of his vision moving to the right continuously, his wife reported freezing with a blank stare lasting seconds to minutes. He was witnessed to have 2 episodes of head and eye turn to the right, unresponsive, with lip smacking lasting 10-15 seconds. There was increased tone on right upper extremity during them. Limited MRI brain no acute changes on [facility] imaging. MRA normal. Overnight EEG 2/22-2/23 showed focal seizures arising from the left parieto-occipital region, he had an average for 3 seizures per hour lasting 2-4 minutes. He was initially on Levetiracetam increased to 2000mg  BID, then Vimpat 100mg  BID added due to continued seizures, then clonazepam 1mg  TID as he continued to have seizures on 2/23-2/24 vEEG monitoring. Depakote 500mg  BID added on. EEG from 2/24-2/25 captured 4 focal seizures, last was on 2/24 at 3pm, then none for the next 24 hours. Seizures felt to be provoked seizures in the setting of hyperglycemia. He had a repeat brain MRI without contrast  on 08/15/22 which was normal.  He and his wife deny any further seizures since his hospitalization, however Russell Robles notes that he is very unstable, speech is different, and there is still confusion but different than the seizures. Speech is slower, softer, mumbling at times. He closes his eyes a lot and says his eyes are tired. He sleeps  majority of the day, getting up for an hour then going back to sleep. He sleeps through the night. He states he can carry on conversations but feels weak, he uses a cane to get up. He is dropping things all the time. His wife has to help him up sometimes. He denies any olfactory/gustatory hallucinations, focal numbness/tingling/weakness, myoclonic jerks. No headaches, dizziness, no further vision changes, neck pain/back pain, bowel dysfunction. He has some urinary urgency and sometimes incontinence. Glucose levels have improved, up to 200 but mostly between 110-120 at home. No alcohol use. He lives with his wife. He had been doing maintenance work prior to the seizures. He had a normal birth and early development.  There is no history of febrile convulsions, CNS infections such as meningitis/encephalitis, significant traumatic brain injury, neurosurgical procedures, or family history of seizures.  Diagnostic Data: Overnight EEG 2/22-2/23/24 showed focal seizures arising from the left parieto-occipital region, he had an average for 3 seizures per hour lasting 2-4 minutes. He continued to have seizures on 2/23-2/24 vEEG monitoring. EEG from 2/24-2/25 captured 4 focal seizures, last was on 2/24 at 3pm. 1-hour wake and sleep EEG in 08/2022 normal.  Brain MRI without contrast on 08/15/22 normal. Repeat brain MRI with and without contrast 08/2022 no acute changes, mild chronic microvascular disease, mild diffuse atrophy.   PAST  MEDICAL HISTORY: Past Medical History:  Diagnosis Date   Anxiety    Arthritis    Cough    PER PT NON-PRODUCTIVE , NO FEVER OR CONGESTION   Hernia, inguinal, right    Hyperlipidemia    Hypertension    Type 2 diabetes mellitus ([name])    last  HbA1c 7.2 on 05-09-2017 in epic    MEDICATIONS: Current Outpatient Medications on File Prior to Visit  Medication Sig Dispense Refill   amLODipine-valsartan (EXFORGE) 5-160 MG tablet TAKE 1 TABLET BY MOUTH DAILY. (TAKE WITH HCTZ 12.5 MG  TABLET) 90 tablet 3   aspirin EC 81 MG tablet Take 81 mg by mouth daily. Swallow whole.     Cholecalciferol (VITAMIN D3) 125 MCG (5000 UT) TABS 5,000 IU OTC vitamin D3 daily. (Patient taking differently: Take 5,000 Units by mouth daily at 12 noon. 5,000 IU OTC vitamin D3 daily.) 90 tablet 3   Continuous Blood Gluc Sensor (DEXCOM G7 SENSOR) MISC Use as directed 4 each 3   Cyanocobalamin (VITAMIN B12 PO) Take 1 tablet by mouth daily.     diazepam (VALIUM) 5 MG tablet Take 1 tablet 30 minutes prior to MRI. May take second dose if needed. 2 tablet 0   Dulaglutide (TRULICITY) 1.5 MG/0.5ML SOPN Inject 1.5 mg into the skin once a week. 2 mL 11   Insulin Glargine (BASAGLAR KWIKPEN) 100 UNIT/ML Inject 25 Units into the skin daily. 15 mL 11   Insulin Pen Needle 31G X 8 MM MISC Use with Insulin Glargine to inject 25 units daily 100 each 4   Lacosamide 100 MG TABS Take 1 tablet (100 mg total) by mouth 2 (two) times daily. 180 tablet 3   metFORMIN (GLUCOPHAGE) 1000 MG tablet Take 1 tablet (1,000 mg total) by mouth 2 (two) times daily with  a meal. 180 tablet 3   nitroGLYCERIN (NITROSTAT) 0.4 MG SL tablet Place 1 tablet (0.4 mg total) under the tongue every 5 (five) minutes as needed for chest pain. If a third tablet is needed please call 911 or go to the emergency department. 30 tablet 0   Potassium 99 MG TABS Take by mouth.     simvastatin (ZOCOR) 80 MG tablet TAKE 1 TABLET (80 MG TOTAL) BY MOUTH AT BEDTIME. 90 tablet 3   No current facility-administered medications on file prior to visit.    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 Right handed    Are you currently employed ? 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 No   Who lives with you? Family    What type of home do you live in: 1 story or 2 story? 1 story       2 stepsons, 1 son       Social Determinants of Corporate investment banker Strain: Not on file  Food Insecurity: No Food Insecurity (08/18/2022)   Hunger Vital Sign    Worried About Running Out of Food in the Last Year: Never true    Ran Out of Food in the Last Year: Never true  Transportation Needs: No Transportation Needs (08/18/2022)   PRAPARE - Administrator, Civil Service (Medical): No    Lack of Transportation (Non-Medical): No  Physical Activity: Not on file  Stress: Not on file  Social Connections: Not on file  Intimate Partner Violence: Not At Risk (08/12/2022)   Humiliation, Afraid, Rape, and Kick questionnaire    Fear of Current or Ex-Partner: No    Emotionally Abused: No    Physically Abused: No    Sexually Abused: No     PHYSICAL EXAM: Vitals:   01/27/23 0846 01/27/23 0851  BP: (!) 151/91 (!) 147/83  Pulse: 66   SpO2: 94%    General: No acute distress Head:  Normocephalic/atraumatic Skin/Extremities: No rash, no edema Neurological Exam: alert and awake. No aphasia or dysarthria, no word  finding difficulties noted in office today. Fund of knowledge is appropriate.  Attention and concentration are normal.  Cranial nerves: Pupils equal, round. Extraocular movements intact with no nystagmus. Visual fields full.  No facial asymmetry.  Motor: Bulk and tone normal, muscle strength 5/5 throughout with no pronator drift.   Finger to nose testing intact.  Gait narrow-based and steady, able to tandem walk adequately.  Romberg negative. No tremors in office today.   IMPRESSION: This is a 62 yo RH man with a history of DM2, hypertension, hyperlipidemia, with new onset focal status epilepticus in February. EEG showed focal seizures arising from the left parieto-occipital region. He remains seizure-free since 08/13/2022. Repeat brain MRI and EEG unremarkable. Side effects improving with weaning off of multiple seizure medications. He mostly notices word finding difficulties and drowsiness, reduce Levetiracetam to 500mg  in AM, 1000mg  in PM (1000mg  1/2 tab in AM, 1 tab in PM). Continue Lacosamide 100mg  BID. Continue glucose control. He will update our office in a month on how he is feeling, and if continued improvement noted, may return to work 02/27/2023. BP today elevated, monitor at home and if still elevated, follow-up with PCP. He is aware of [place] driving laws to stop driving after a seizure until 6 months seizure-free. Follow-up in 2 months, call for any changes.    Thank you for allowing me to participate in his care.  Please do not hesitate to call for any questions or concerns.   Patrcia Dolly, M.D.   CC: Dr. Alphonsus Sias

## 2023-01-27 NOTE — Patient Instructions (Signed)
Good to see you doing better.  Reduce Levetiracetam (Keppra) 1000mg : take 1/2 tablet in AM, 1 tablet in PM  2. Continue Lacosamide 100mg  twice a day  3. Continue to monitor symptoms  4. Send me a Clinical cytogeneticist message after Labor Day for an update on how you are doing for return to work letter  5. Follow-up in 2 months, call for any changes   Seizure Precautions: 1. If medication has been prescribed for you to prevent seizures, take it exactly as directed.  Do not stop taking the medicine without talking to your doctor first, even if you have not had a seizure in a long time.   2. Avoid activities in which a seizure would cause danger to yourself or to others.  Don't operate dangerous machinery, swim alone, or climb in high or dangerous places, such as on ladders, roofs, or girders.  Do not drive unless your doctor says you may.  3. If you have any warning that you may have a seizure, lay down in a safe place where you can't hurt yourself.    4.  No driving for 6 months from last seizure, as per [facility].   Please refer to the following link on the [employer]'s website for more information: http://www.epilepsyfoundation.org/answerplace/Social/driving/drivingu.cfm   5.  Maintain good sleep hygiene. Avoid alcohol.  6.  Contact your doctor if you have any problems that may be related to the medicine you are taking.  7.  Call 911 and bring the patient back to the ED if:        A.  The seizure lasts longer than 5 minutes.       B.  The patient doesn't awaken shortly after the seizure  C.  The patient has new problems such as difficulty seeing, speaking or moving  D.  The patient was injured during the seizure  E.  The patient has a temperature over 102 F (39C)  F.  The patient vomited and now is having trouble breathing

## 2023-02-23 ENCOUNTER — Encounter: Payer: Self-pay | Admitting: Neurology

## 2023-02-24 ENCOUNTER — Encounter: Payer: Self-pay | Admitting: Neurology

## 2023-03-08 ENCOUNTER — Other Ambulatory Visit: Payer: Self-pay | Admitting: Nurse Practitioner

## 2023-03-08 DIAGNOSIS — E118 Type 2 diabetes mellitus with unspecified complications: Secondary | ICD-10-CM

## 2023-03-29 ENCOUNTER — Encounter: Payer: Self-pay | Admitting: Neurology

## 2023-03-29 ENCOUNTER — Ambulatory Visit (INDEPENDENT_AMBULATORY_CARE_PROVIDER_SITE_OTHER): Payer: No Typology Code available for payment source | Admitting: Neurology

## 2023-03-29 VITALS — BP 139/79 | HR 85 | Ht 68.0 in | Wt 226.6 lb

## 2023-03-29 DIAGNOSIS — R569 Unspecified convulsions: Secondary | ICD-10-CM | POA: Diagnosis not present

## 2023-03-29 MED ORDER — LACOSAMIDE 100 MG PO TABS: 100.0000 mg | ORAL_TABLET | Freq: Two times a day (BID) | ORAL | 3 refills | Status: DC

## 2023-03-29 MED ORDER — LEVETIRACETAM 1000 MG PO TABS: ORAL_TABLET | ORAL | 3 refills | Status: DC

## 2023-03-29 NOTE — Progress Notes (Signed)
NEUROLOGY FOLLOW UP OFFICE NOTE  Russell Robles [id number] Mar 28, 1961  HISTORY OF PRESENT ILLNESS: I had the pleasure of seeing Russell Robles in follow-up in the neurology clinic on 03/29/2023.  He is again accompanied by his wife who helps supplement the history today. The patient was last seen 2 months ago for seizures. He had new onset focal status epilepticus in 07/2022. MRI brain normal. Seizures felt to be provoked in the setting of hyperglycemia. He continues to deny any seizures or seizure-like symptoms since 08/13/22. On last visit, he was reporting continued drowsiness and word-finding difficulties, Levetiracetam reduced to 500mg  in AM, 1000mg  in PM. He is also on Lacosamide 100mg  BID. He feels these are not as bad, he has been back to work without issues and coworkers have not noticed anything off. He is driving. No staring/unresponsive episodes, gaps in time, olfactory/gustatory hallucinations, focal numbness/tingling/weakness, myoclonic jerks. No headaches, dizziness, vision changes, no falls. He has not noticed the tremors recently. He is getting good sleep. His wife feels he is still drowsy some but he states he feels fine. Glucose levels overall better, it is high today after lunch. Mood is good.    History on Initial Assessment 08/22/2022: This is a 62 year old right-handed man with a history of DM2, hypertension, hyperlipidemia, in his usual state of health until mid-February when he started having episodes of dizziness with transient blurred vision. He would feel that everything from mid-point would move to the right for 3-5 minutes, then start over again, lasting 10-15 minutes. He is able to comprehend but would not form the sentences he wanted to. He went to the ER for these symptoms on 08/04/22 and reported recently stopping Ozempic and starting Janumet a few days prior. Glucose was 510. Head CT no acute changes, there was an old left basal ganglia lacunar infarct and diffuse atrophy  and chronic microvascular disease. On his PCP visit on 08/10/22, they reported moderate confusion, he was forgetting simple instructions, words, thought process changes completely, forgetting thought mid-sentence. He went to the ER later that day and was seen by Neurology, he reported a sensation of his vision moving to the right continuously, his wife reported freezing with a blank stare lasting seconds to minutes. He was witnessed to have 2 episodes of head and eye turn to the right, unresponsive, with lip smacking lasting 10-15 seconds. There was increased tone on right upper extremity during them. Limited MRI brain no acute changes on DWI imaging. MRA normal. Overnight EEG 2/22-2/23 showed focal seizures arising from the left parieto-occipital region, he had an average for 3 seizures per hour lasting 2-4 minutes. He was initially on Levetiracetam increased to 2000mg  BID, then Vimpat 100mg  BID added due to continued seizures, then clonazepam 1mg  TID as he continued to have seizures on 2/23-2/24 vEEG monitoring. Depakote 500mg  BID added on. EEG from 2/24-2/25 captured 4 focal seizures, last was on 2/24 at 3pm, then none for the next 24 hours. Seizures felt to be provoked seizures in the setting of hyperglycemia. He had a repeat brain MRI without contrast on 08/15/22 which was normal.  He and his wife deny any further seizures since his hospitalization, however Rosey Bath notes that he is very unstable, speech is different, and there is still confusion but different than the seizures. Speech is slower, softer, mumbling at times. He closes his eyes a lot and says his eyes are tired. He sleeps majority of the day, getting up for an hour then going back to  sleep. He sleeps through the night. He states he can carry on conversations but feels weak, he uses a cane to get up. He is dropping things all the time. His wife has to help him up sometimes. He denies any olfactory/gustatory hallucinations, focal  numbness/tingling/weakness, myoclonic jerks. No headaches, dizziness, no further vision changes, neck pain/back pain, bowel dysfunction. He has some urinary urgency and sometimes incontinence. Glucose levels have improved, up to 200 but mostly between 110-120 at home. No alcohol use. He lives with his wife. He had been doing maintenance work prior to the seizures. He had a normal birth and early development.  There is no history of febrile convulsions, CNS infections such as meningitis/encephalitis, significant traumatic brain injury, neurosurgical procedures, or family history of seizures.  Diagnostic Data: Overnight EEG 2/22-2/23/24 showed focal seizures arising from the left parieto-occipital region, he had an average for 3 seizures per hour lasting 2-4 minutes. He continued to have seizures on 2/23-2/24 vEEG monitoring. EEG from 2/24-2/25 captured 4 focal seizures, last was on 2/24 at 3pm. 1-hour wake and sleep EEG in 08/2022 normal.  Brain MRI without contrast on 08/15/22 normal. Repeat brain MRI with and without contrast 08/2022 no acute changes, mild chronic microvascular disease, mild diffuse atrophy.   PAST MEDICAL HISTORY: Past Medical History:  Diagnosis Date   Anxiety    Arthritis    Cough    PER PT NON-PRODUCTIVE , NO FEVER OR CONGESTION   Hernia, inguinal, right    Hyperlipidemia    Hypertension    Type 2 diabetes mellitus ([name])    last  HbA1c 7.2 on 05-09-2017 in epic    MEDICATIONS: Current Outpatient Medications on File Prior to Visit  Medication Sig Dispense Refill   amLODipine-valsartan (EXFORGE) 5-160 MG tablet TAKE 1 TABLET BY MOUTH DAILY. (TAKE WITH HCTZ 12.5 MG TABLET) 90 tablet 3   aspirin EC 81 MG tablet Take 81 mg by mouth daily. Swallow whole.     Cholecalciferol (VITAMIN D3) 125 MCG (5000 UT) TABS 5,000 IU OTC vitamin D3 daily. (Patient taking differently: Take 5,000 Units by mouth daily at 12 noon. 5,000 IU OTC vitamin D3 daily.) 90 tablet 3   Continuous Glucose  Sensor (DEXCOM G7 SENSOR) MISC USE AS DIRECTED 3 each 3   Cyanocobalamin (VITAMIN B12 PO) Take 1 tablet by mouth daily.     Dulaglutide (TRULICITY) 1.5 MG/0.5ML SOPN Inject 1.5 mg into the skin once a week. 2 mL 11   Insulin Glargine (BASAGLAR KWIKPEN) 100 UNIT/ML Inject 25 Units into the skin daily. 15 mL 11   Insulin Pen Needle 31G X 8 MM MISC Use with Insulin Glargine to inject 25 units daily 100 each 4   Lacosamide 100 MG TABS Take 1 tablet (100 mg total) by mouth 2 (two) times daily. 180 tablet 3   levETIRAcetam (KEPPRA) 1000 MG tablet Take 1/2 tablet in AM, 1 tablet in PM 135 tablet 3   metFORMIN (GLUCOPHAGE) 1000 MG tablet Take 1 tablet (1,000 mg total) by mouth 2 (two) times daily with a meal. 180 tablet 3   simvastatin (ZOCOR) 80 MG tablet TAKE 1 TABLET (80 MG TOTAL) BY MOUTH AT BEDTIME. 90 tablet 3   nitroGLYCERIN (NITROSTAT) 0.4 MG SL tablet Place 1 tablet (0.4 mg total) under the tongue every 5 (five) minutes as needed for chest pain. If a third tablet is needed please call 911 or go to the emergency department. (Patient not taking: Reported on 01/27/2023) 30 tablet 0   No current  facility-administered medications on file prior to visit.    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 Right handed    Are you currently employed ? 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 No   Who lives with you? Family    What type of home do you live in: 1 story or 2 story? 1 story       2 stepsons, 1 son       Social Determinants of Corporate investment banker Strain: Not on file  Food Insecurity: No Food Insecurity (08/18/2022)   Hunger Vital Sign    Worried About Running Out of Food in the Last Year: Never true    Ran Out of Food in the Last Year: Never true  Transportation Needs: No Transportation Needs (08/18/2022)   PRAPARE - Administrator, Civil Service (Medical): No    Lack of Transportation (Non-Medical): No  Physical Activity: Not on file  Stress: Not on file  Social Connections: Not on file  Intimate Partner Violence: Not At Risk (08/12/2022)   Humiliation, Afraid, Rape, and Kick questionnaire    Fear of Current or Ex-Partner: No    Emotionally Abused: No    Physically Abused: No    Sexually Abused: No     PHYSICAL EXAM: Vitals:   03/29/23 1437  BP: 139/79  Pulse: 85  SpO2: 94%   General: No acute distress Head:  Normocephalic/atraumatic Skin/Extremities: No rash, no edema Neurological Exam: alert and awake. No aphasia or dysarthria. Fund of knowledge is appropriate.  Attention and concentration are normal.   Cranial nerves: Pupils equal, round. Extraocular movements intact with no nystagmus. Visual fields full.  No facial asymmetry.  Motor: Bulk and tone normal, muscle strength 5/5 throughout with no pronator drift.   Finger to nose testing intact.  Gait narrow-based and steady, mild difficulty with tandem  walk.  Romberg negative. No tremors.   IMPRESSION: This is a 62 yo RH man with a history of DM2, hypertension, hyperlipidemia, with new onset focal status epilepticus in February. Seizures felt to be provoked in the setting of hyperglycemia. EEG showed focal seizures arising from the left parieto-occipital region.  He remains seizure-free since 08/13/22. Repeat brain MRI and EEG unremarkable. He is overall tolerating Levetiracetam 500mg  in AM, 1000mg  in PM and Lacosamide 100mg  BID. His wife feels there may still be some drowsiness, we can consider further reducing Levetiracetam on next visit. Continue glucose control. He is aware of [place] driving laws to stop driving after a seizure until 6 months seizure-free. Follow-up in 4-5 months, call for any changes.     Thank you for allowing me to participate in his care.  Please do not hesitate to call for any questions or concerns.    Patrcia Dolly, M.D.   CC: Dr. Alphonsus Sias

## 2023-03-29 NOTE — Patient Instructions (Signed)
Good to see you doing well! Continue all your medications. Follow-up in 4-5 months, call for any changes.    Seizure Precautions: 1. If medication has been prescribed for you to prevent seizures, take it exactly as directed.  Do not stop taking the medicine without talking to your doctor first, even if you have not had a seizure in a long time.   2. Avoid activities in which a seizure would cause danger to yourself or to others.  Don't operate dangerous machinery, swim alone, or climb in high or dangerous places, such as on ladders, roofs, or girders.  Do not drive unless your doctor says you may.  3. If you have any warning that you may have a seizure, lay down in a safe place where you can't hurt yourself.    4.  No driving for 6 months from last seizure, as per [facility].   Please refer to the following link on the Epilepsy Foundation of America's website for more information: http://www.epilepsyfoundation.org/answerplace/Social/driving/drivingu.cfm   5.  Maintain good sleep hygiene. Avoid alcohol.  6.  Contact your doctor if you have any problems that may be related to the medicine you are taking.  7.  Call 911 and bring the patient back to the ED if:        A.  The seizure lasts longer than 5 minutes.       B.  The patient doesn't awaken shortly after the seizure  C.  The patient has new problems such as difficulty seeing, speaking or moving  D.  The patient was injured during the seizure  E.  The patient has a temperature over 102 F (39C)  F.  The patient vomited and now is having trouble breathing

## 2023-04-05 ENCOUNTER — Encounter: Payer: Self-pay | Admitting: Neurology

## 2023-05-12 ENCOUNTER — Encounter: Payer: Self-pay | Admitting: Internal Medicine

## 2023-05-12 ENCOUNTER — Ambulatory Visit (INDEPENDENT_AMBULATORY_CARE_PROVIDER_SITE_OTHER): Payer: No Typology Code available for payment source | Admitting: Internal Medicine

## 2023-05-12 VITALS — BP 138/88 | HR 71 | Temp 98.2°F | Ht 68.5 in | Wt 226.0 lb

## 2023-05-12 DIAGNOSIS — E1142 Type 2 diabetes mellitus with diabetic polyneuropathy: Secondary | ICD-10-CM

## 2023-05-12 DIAGNOSIS — E119 Type 2 diabetes mellitus without complications: Secondary | ICD-10-CM | POA: Diagnosis not present

## 2023-05-12 DIAGNOSIS — Z0000 Encounter for general adult medical examination without abnormal findings: Secondary | ICD-10-CM | POA: Diagnosis not present

## 2023-05-12 DIAGNOSIS — R569 Unspecified convulsions: Secondary | ICD-10-CM

## 2023-05-12 DIAGNOSIS — Z125 Encounter for screening for malignant neoplasm of prostate: Secondary | ICD-10-CM

## 2023-05-12 DIAGNOSIS — Z23 Encounter for immunization: Secondary | ICD-10-CM | POA: Diagnosis not present

## 2023-05-12 DIAGNOSIS — I10 Essential (primary) hypertension: Secondary | ICD-10-CM

## 2023-05-12 DIAGNOSIS — Z7984 Long term (current) use of oral hypoglycemic drugs: Secondary | ICD-10-CM

## 2023-05-12 DIAGNOSIS — Z7985 Long-term (current) use of injectable non-insulin antidiabetic drugs: Secondary | ICD-10-CM

## 2023-05-12 DIAGNOSIS — Z794 Long term (current) use of insulin: Secondary | ICD-10-CM

## 2023-05-12 LAB — LIPID PANEL
Cholesterol: 128 mg/dL (ref 0–200)
HDL: 36.1 mg/dL — ABNORMAL LOW (ref >39.00)
LDL Cholesterol: 55 mg/dL (ref 0–99)
NonHDL: 92.27
Total CHOL/HDL Ratio: 4
Triglycerides: 187 mg/dL — ABNORMAL HIGH (ref 0.0–149.0)
VLDL: 37.4 mg/dL (ref 0.0–40.0)

## 2023-05-12 LAB — COMPREHENSIVE METABOLIC PANEL
ALT: 27 U/L (ref 0–53)
AST: 24 U/L (ref 0–37)
Albumin: 4.6 g/dL (ref 3.5–5.2)
Alkaline Phosphatase: 61 U/L (ref 39–117)
BUN: 18 mg/dL (ref 6–23)
CO2: 27 meq/L (ref 19–32)
Calcium: 10.2 mg/dL (ref 8.4–10.5)
Chloride: 99 meq/L (ref 96–112)
Creatinine, Ser: 0.95 mg/dL (ref 0.40–1.50)
GFR: 86.06 mL/min (ref >60.00)
Glucose, Bld: 154 mg/dL — ABNORMAL HIGH (ref 70–99)
Potassium: 4.1 meq/L (ref 3.5–5.1)
Sodium: 136 meq/L (ref 135–145)
Total Bilirubin: 0.5 mg/dL (ref 0.2–1.2)
Total Protein: 7.2 g/dL (ref 6.0–8.3)

## 2023-05-12 LAB — PSA: PSA: 1.2 ng/mL (ref 0.10–4.00)

## 2023-05-12 LAB — CBC
HCT: 44.6 % (ref 39.0–52.0)
Hemoglobin: 14.9 g/dL (ref 13.0–17.0)
MCHC: 33.4 g/dL (ref 30.0–36.0)
MCV: 86.6 fL (ref 78.0–100.0)
Platelets: 271 10*3/uL (ref 150.0–400.0)
RBC: 5.15 Mil/uL (ref 4.22–5.81)
RDW: 13.2 % (ref 11.5–15.5)
WBC: 10.1 10*3/uL (ref 4.0–10.5)

## 2023-05-12 LAB — MICROALBUMIN / CREATININE URINE RATIO
Creatinine,U: 94.1 mg/dL
Microalb Creat Ratio: 15.6 mg/g (ref 0.0–30.0)
Microalb, Ur: 14.7 mg/dL — ABNORMAL HIGH (ref 0.0–1.9)

## 2023-05-12 LAB — HM DIABETES FOOT EXAM

## 2023-05-12 LAB — HEMOGLOBIN A1C: Hgb A1c MFr Bld: 7.8 % — ABNORMAL HIGH (ref 4.6–6.5)

## 2023-05-12 NOTE — Assessment & Plan Note (Signed)
Back to driving and work Slowly weaning meds with Dr Karel Jarvis

## 2023-05-12 NOTE — Assessment & Plan Note (Signed)
BP Readings from Last 3 Encounters:  05/12/23 138/88  03/29/23 139/79  01/27/23 (!) 147/83   Controlled on amlodipine 5, valsartan 160 daily

## 2023-05-12 NOTE — Progress Notes (Signed)
Subjective:    Patient ID: Russell Robles, male    DOB: 05-10-1961, 62 y.o.   MRN: [id number]  HPI Here for physical  Finally back to work---since 9/9 Still on the 2 seizure meds--- keppra dose was cut  Hsan't had DEXCOM for a while Rx wasn't done Checks sugars occasionally---stable 140's No low sugar reactions Ongoing foot pain at night--burning sensation. Can get blisters on plantar left foot Interested in switching back to ozempic--had been on this in the past  Some leg cramps at night Episodic--not recently though Has tried pickle juice and mustard---and they help  Current Outpatient Medications on File Prior to Visit  Medication Sig Dispense Refill   amLODipine-valsartan (EXFORGE) 5-160 MG tablet TAKE 1 TABLET BY MOUTH DAILY. (TAKE WITH HCTZ 12.5 MG TABLET) 90 tablet 3   aspirin EC 81 MG tablet Take 81 mg by mouth daily. Swallow whole.     Cholecalciferol (VITAMIN D3) 125 MCG (5000 UT) TABS 5,000 IU OTC vitamin D3 daily. (Patient taking differently: Take 5,000 Units by mouth daily at 12 noon. 5,000 IU OTC vitamin D3 daily.) 90 tablet 3   Continuous Glucose Sensor (DEXCOM G7 SENSOR) MISC USE AS DIRECTED 3 each 3   Cyanocobalamin (VITAMIN B12 PO) Take 1 tablet by mouth daily.     Dulaglutide (TRULICITY) 1.5 MG/0.5ML SOPN Inject 1.5 mg into the skin once a week. 2 mL 11   Insulin Glargine (BASAGLAR KWIKPEN) 100 UNIT/ML Inject 25 Units into the skin daily. 15 mL 11   Insulin Pen Needle 31G X 8 MM MISC Use with Insulin Glargine to inject 25 units daily 100 each 4   Lacosamide 100 MG TABS Take 1 tablet (100 mg total) by mouth 2 (two) times daily. 180 tablet 3   levETIRAcetam (KEPPRA) 1000 MG tablet Take 1/2 tablet in AM, 1 tablet in PM 135 tablet 3   metFORMIN (GLUCOPHAGE) 1000 MG tablet Take 1 tablet (1,000 mg total) by mouth 2 (two) times daily with a meal. 180 tablet 3   simvastatin (ZOCOR) 80 MG tablet TAKE 1 TABLET (80 MG TOTAL) BY MOUTH AT BEDTIME. 90 tablet 3   No  current facility-administered medications on file prior to visit.    No Known Allergies  Past Medical History:  Diagnosis Date   Anxiety    Arthritis    Cough    PER PT NON-PRODUCTIVE , NO FEVER OR CONGESTION   Hernia, inguinal, right    Hyperlipidemia    Hypertension    Type 2 diabetes mellitus ([name])    last  HbA1c 7.2 on 05-09-2017 in epic    Past Surgical History:  Procedure Laterality Date   CARPAL TUNNEL RELEASE Left 08/02/2018   Procedure: CARPAL TUNNEL RELEASE;  Surgeon: Juanell Fairly, MD;  Location: [facility] ORS;  Service: Orthopedics;  Laterality: Left;   HERNIA REPAIR N/A    Phreesia 05/18/2020   INGUINAL HERNIA REPAIR Left 12-16-2010  dr Dwain Sarna  [facility]   INGUINAL HERNIA REPAIR Right 06/01/2017   Procedure: OPEN RIGHT INGUINAL HERNIA REPAIR WITH MESH;  Surgeon: Kinsinger, De Blanch, MD;  Location: [facility] [place];  Service: General;  Laterality: Right;  GENERAL AND TAP BLOCK   INSERTION OF MESH Right 06/01/2017   Procedure: INSERTION OF MESH;  Surgeon: Kinsinger, De Blanch, MD;  Location: [facility] [place];  Service: General;  Laterality: Right;  GENERAL AND TAP BLOCK    Family History  Problem Relation Age of Onset   COPD Mother    Aortic  aneurysm Father    COPD Sister    Lung cancer Sister    Colon cancer Neg Hx    Colon polyps Neg Hx    Esophageal cancer Neg Hx    Rectal cancer Neg Hx    Stomach cancer Neg Hx     Social History   Socioeconomic History   Marital status: Married    Spouse name: Not on file   Number of children: 3   Years of education: Not on file   Highest education level: Not on file  Occupational History   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 Right handed    Are you currently employed ? 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 No   Who lives with you? Family    What type of home do you live in: 1 story or 2 story? 1 story       2 stepsons, 1 son       Social Determinants of Corporate investment banker Strain: Not on file  Food Insecurity: No Food Insecurity (08/18/2022)   Hunger Vital Sign    Worried About Running Out of Food in the Last Year: Never true    Ran Out of Food in the Last Year: Never true  Transportation Needs: No Transportation Needs (08/18/2022)   PRAPARE - Administrator, Civil Service (Medical): No    Lack of Transportation (Non-Medical): No  Physical Activity: Not on file  Stress: Not on file  Social Connections: Not on file  Intimate Partner Violence: Not At Risk (08/12/2022)   Humiliation, Afraid, Rape, and Kick questionnaire    Fear of Current or Ex-Partner: No    Emotionally Abused: No    Physically Abused: No    Sexually Abused: No   Review of Systems  Constitutional:  Negative for fatigue.       Weight up some on the trulicity--did better on the ozempic Wears seat belt Not really exercising since the seizures--plans to restart umpiring, etc  HENT:  Positive for tinnitus. Negative for dental problem and hearing loss.        Keeps up with dentist  Eyes:  Negative for visual disturbance.  Respiratory:  Negative for cough, chest tightness and shortness of breath.   Cardiovascular:  Positive for leg swelling. Negative for chest pain and palpitations.       Edema gone in AM  Gastrointestinal:  Negative for blood in stool and constipation.       No recent heartburn  Endocrine: Negative for polydipsia and polyuria.  Genitourinary:  Negative  for urgency.       Occ dribbling No sexual problems  Musculoskeletal:  Negative for arthralgias, back pain and joint swelling.  Skin:        No suspicious spots  Allergic/Immunologic: Positive for environmental allergies. Negative for immunocompromised state.       Uses  OTC cetrizine with success  Neurological:  Negative for dizziness, syncope, light-headedness and headaches.  Hematological:  Negative for adenopathy.       Some bruising at injection sites  Psychiatric/Behavioral:  Negative for dysphoric mood and sleep disturbance. The patient is not nervous/anxious.        Objective:   Physical Exam Constitutional:      Appearance: Normal appearance.  HENT:     Mouth/Throat:     Pharynx: No oropharyngeal exudate or posterior oropharyngeal erythema.  Eyes:     Conjunctiva/sclera: Conjunctivae normal.     Pupils: Pupils are equal, round, and reactive to light.  Cardiovascular:     Rate and Rhythm: Normal rate and regular rhythm.     Pulses: Normal pulses.     Heart sounds:     No gallop.     Comments: Bigeminy Gr 2/6 aortic systolic murmur Pulmonary:     Effort: Pulmonary effort is normal.     Breath sounds: Normal breath sounds. No wheezing or rales.  Abdominal:     Palpations: Abdomen is soft.     Tenderness: There is no abdominal tenderness.  Musculoskeletal:     Cervical back: Neck supple.     Right lower leg: No edema.     Left lower leg: No edema.  Lymphadenopathy:     Cervical: No cervical adenopathy.  Skin:    Findings: No lesion.     Comments: Small blister over the arch of left foot (discussed friction)  Neurological:     General: No focal deficit present.     Mental Status: He is alert and oriented to person, place, and time.     Comments: Mild decreased sensation in feet  Psychiatric:        Mood and Affect: Mood normal.        Behavior: Behavior normal.            Assessment & Plan:

## 2023-05-12 NOTE — Assessment & Plan Note (Addendum)
Seems to be well controlled with trulicity 1.5mg  weekly 25 units of basaglar and metgformin 1000 bid Will plan to change back to ozempic in January---at 1mg  (and consider titrating to 2mg )  Try TENS for the neuropathy

## 2023-05-12 NOTE — Assessment & Plan Note (Signed)
Needs to work on fitness Flu vaccine today Prefers no COVID Colon due again 2030 Will check PSA

## 2023-05-25 ENCOUNTER — Encounter: Payer: Self-pay | Admitting: Internal Medicine

## 2023-05-25 MED ORDER — SEMAGLUTIDE (1 MG/DOSE) 4 MG/3ML ~~LOC~~ SOPN: 1.0000 mg | PEN_INJECTOR | SUBCUTANEOUS | 11 refills | Status: DC

## 2023-05-26 MED ORDER — SEMAGLUTIDE (1 MG/DOSE) 4 MG/3ML ~~LOC~~ SOPN: 1.0000 mg | PEN_INJECTOR | SUBCUTANEOUS | 11 refills | Status: DC

## 2023-05-30 ENCOUNTER — Other Ambulatory Visit (HOSPITAL_COMMUNITY): Payer: Self-pay

## 2023-05-30 ENCOUNTER — Telehealth: Payer: Self-pay | Admitting: Internal Medicine

## 2023-05-30 ENCOUNTER — Telehealth: Payer: Self-pay

## 2023-05-30 NOTE — Telephone Encounter (Signed)
Patient called in to check the status of the PA for his Semaglutide, 1 MG/DOSE, 4 MG/3ML SOPN. Thank you!

## 2023-05-30 NOTE — Telephone Encounter (Signed)
Pharmacy Patient Advocate Encounter   Received notification from Physician's Office that prior authorization for Ozempic is required/requested.   Insurance verification completed.   The patient is insured through [facility] [place] .   Per test claim: PA required; PA submitted to above mentioned insurance via CoverMyMeds Key/confirmation #/EOC (Key: [id number]         Status is pending

## 2023-05-31 ENCOUNTER — Other Ambulatory Visit (HOSPITAL_COMMUNITY): Payer: Self-pay

## 2023-05-31 NOTE — Telephone Encounter (Signed)
Message sent to pt's My Chart.  

## 2023-05-31 NOTE — Telephone Encounter (Signed)
Pharmacy Patient Advocate Encounter  Received notification from [facility] that Prior Authorization for Ozempic (1 MG/DOSE) 4MG /3ML pen-injectors has been APPROVED from 05/30/2023 to 05/29/2024. Ran test claim, Copay is $24.99. This test claim was processed through [facility]- copay amounts may vary at other pharmacies due to pharmacy/plan contracts, or as the patient moves through the different stages of their insurance plan.   PA #/Case ID/Reference #: [id number]

## 2023-06-01 NOTE — Telephone Encounter (Signed)
PA request has been Approved. New Encounter created for follow up. For additional info see Pharmacy Prior Auth telephone encounter from 05/30/23.

## 2023-06-26 DIAGNOSIS — H25013 Cortical age-related cataract, bilateral: Secondary | ICD-10-CM | POA: Diagnosis not present

## 2023-06-26 DIAGNOSIS — E119 Type 2 diabetes mellitus without complications: Secondary | ICD-10-CM | POA: Diagnosis not present

## 2023-06-26 DIAGNOSIS — H2513 Age-related nuclear cataract, bilateral: Secondary | ICD-10-CM | POA: Diagnosis not present

## 2023-06-26 DIAGNOSIS — H02831 Dermatochalasis of right upper eyelid: Secondary | ICD-10-CM | POA: Diagnosis not present

## 2023-07-17 ENCOUNTER — Other Ambulatory Visit (HOSPITAL_COMMUNITY): Payer: Self-pay

## 2023-07-17 ENCOUNTER — Telehealth: Payer: Self-pay | Admitting: Pharmacy Technician

## 2023-07-17 NOTE — Telephone Encounter (Signed)
Pharmacy Patient Advocate Encounter   Received notification from CoverMyMeds that prior authorization for Basaglar KwikPen 100UNIT/ML pen-injectors is required/requested.   Insurance verification completed.   The patient is insured through [facility] .   Per test claim: PA required; PA submitted to above mentioned insurance via CoverMyMeds Key/confirmation #/EOC [id number] Status is pending    **INSURANCE PREFERS SEMGLEE**

## 2023-07-20 ENCOUNTER — Other Ambulatory Visit (HOSPITAL_COMMUNITY): Payer: Self-pay

## 2023-07-20 ENCOUNTER — Telehealth: Payer: Self-pay | Admitting: Internal Medicine

## 2023-07-20 MED ORDER — INSULIN GLARGINE-YFGN 100 UNIT/ML ~~LOC~~ SOLN: 25.0000 [IU] | Freq: Every day | SUBCUTANEOUS | 11 refills | Status: DC

## 2023-07-20 NOTE — Telephone Encounter (Signed)
Patient is calling in about his insulin pen he stated that the insulin pen was rejected by his insurance the pharmacy is unable to run it again the pharmacy needs for the dr to send over another prescription for his insulin pen

## 2023-07-20 NOTE — Telephone Encounter (Signed)
Change made.

## 2023-07-20 NOTE — Telephone Encounter (Signed)
A prior Berkley Harvey is pending for the Basaglar. We did receive a note that states they will cover Semglee without a PA.

## 2023-07-20 NOTE — Telephone Encounter (Signed)
See other phone note. Semglee was sent in to the pharmacy.

## 2023-07-20 NOTE — Telephone Encounter (Signed)
Pharmacy Patient Advocate Encounter  Received notification from [facility] that Prior Authorization for Basaglar KwikPen 100UNIT/ML pen-injectors  has been DENIED.  See denial reason below. No denial letter attached in CMM. Will attach denial letter to Media tab once received.   PA #/Case ID/Reference #: [id number]   [employer] prefers [facility]. Please advise.[employer] verification completed.   Ran test claim for Semglee (yfgn) 100/ UNIT/ML Pen. Currently a quantity of 15 mL is a 60 day supply and the co-pay is $120.00 .  This test claim was processed through [facility]- copay amounts may vary at other pharmacies due to pharmacy/plan contracts, or as the patient moves through the different stages of their insurance plan.

## 2023-07-31 ENCOUNTER — Encounter: Payer: Self-pay | Admitting: Neurology

## 2023-07-31 ENCOUNTER — Ambulatory Visit: Payer: BC Managed Care – PPO | Admitting: Neurology

## 2023-07-31 VITALS — BP 144/81 | HR 68 | Ht 68.0 in | Wt 226.0 lb

## 2023-07-31 DIAGNOSIS — R400 Somnolence: Secondary | ICD-10-CM | POA: Diagnosis not present

## 2023-07-31 DIAGNOSIS — R569 Unspecified convulsions: Secondary | ICD-10-CM

## 2023-07-31 MED ORDER — LACOSAMIDE 100 MG PO TABS: 100.0000 mg | ORAL_TABLET | Freq: Two times a day (BID) | ORAL | 3 refills | Status: DC

## 2023-07-31 MED ORDER — LEVETIRACETAM 500 MG PO TABS: 500.0000 mg | ORAL_TABLET | Freq: Two times a day (BID) | ORAL | 3 refills | Status: DC

## 2023-07-31 NOTE — Patient Instructions (Signed)
 Good to see you.  Reduce Levetiracetam  to 500mg  twice a day. With your current bottle of Levetiracetam  1000mg : Take 1/2 tablet twice a day. Once done, your new prescription will be for Levetiracetam  500mg : take 1 tablet twice a day  2. Continue Lacosamide  100mg  twice a day  3. Home sleep study will be ordered  4. Follow-up in 4 months, call for any changes   Seizure Precautions: 1. If medication has been prescribed for you to prevent seizures, take it exactly as directed.  Do not stop taking the medicine without talking to your doctor first, even if you have not had a seizure in a long time.   2. Avoid activities in which a seizure would cause danger to yourself or to others.  Don't operate dangerous machinery, swim alone, or climb in high or dangerous places, such as on ladders, roofs, or girders.  Do not drive unless your doctor says you may.  3. If you have any warning that you may have a seizure, lay down in a safe place where you can't hurt yourself.    4.  No driving for 6 months from last seizure, as per [place]  state law.   Please refer to the following link on the Epilepsy Foundation of America's website for more information: http://www.epilepsyfoundation.org/answerplace/Social/driving/drivingu.cfm   5.  Maintain good sleep hygiene. Avoid alcohol.  6.  Contact your doctor if you have any problems that may be related to the medicine you are taking.  7.  Call 911 and bring the patient back to the ED if:        A.  The seizure lasts longer than 5 minutes.       B.  The patient doesn't awaken shortly after the seizure  C.  The patient has new problems such as difficulty seeing, speaking or moving  D.  The patient was injured during the seizure  E.  The patient has a temperature over 102 F (39C)  F.  The patient vomited and now is having trouble breathing

## 2023-07-31 NOTE — Progress Notes (Signed)
 NEUROLOGY FOLLOW UP OFFICE NOTE  Russell Robles [id number] 1960-08-28  HISTORY OF PRESENT ILLNESS: I had the pleasure of seeing Russell Robles in follow-up in the neurology clinic on 07/31/2023.  The patient was last seen 4 months ago for seizures. He is alone in the office today.  Records and images were personally reviewed where available.  He had new onset focal status epilepticus in 07/2022. MRI brain normal. Seizures felt to be provoked in the setting of hyperglycemia. He has been seizure-free since 07/2022 on Levetiracetam  1000mg  1/2 tab in AM, 1 tab in PM and Lacosamide  100mg  BID. We have been reducing Levetiracetam  dose due to drowsiness, he continues to report daytime drowsiness. He sleeps a lot, and on weekdays would wake up alert without issues. He then would need a power nap at lunch, then when he gets home, he takes a nap if he sits for more than 10 minutes. On weekends, he can sleep in until 2pm. He snores and was told in the hospital that he had apneic episodes. He denies any staring/unresponsive episodes, gaps in time, olfactory/gustatory hallucinations, focal numbness/tingling/weakness, myoclonic jerks. No headaches, dizziness, vision changes, no falls. He has noticed word finding difficulties, he knows what he wants to say but cannot come up with the word. For instance today, he could not think of the word "salary." They occur around once a day, usually when in a long conversation.    History on Initial Assessment 08/22/2022: This is a 63 year old right-handed man with a history of DM2, hypertension, hyperlipidemia, in his usual state of health until mid-February when he started having episodes of dizziness with transient blurred vision. He would feel that everything from mid-point would move to the right for 3-5 minutes, then start over again, lasting 10-15 minutes. He is able to comprehend but would not form the sentences he wanted to. He went to the ER for these symptoms on 08/04/22 and  reported recently stopping Ozempic  and starting Janumet  a few days prior. Glucose was 510. Head CT no acute changes, there was an old left basal ganglia lacunar infarct and diffuse atrophy and chronic microvascular disease. On his PCP visit on 08/10/22, they reported moderate confusion, he was forgetting simple instructions, words, thought process changes completely, forgetting thought mid-sentence. He went to the ER later that day and was seen by Neurology, he reported a sensation of his vision moving to the right continuously, his wife reported freezing with a blank stare lasting seconds to minutes. He was witnessed to have 2 episodes of head and eye turn to the right, unresponsive, with lip smacking lasting 10-15 seconds. There was increased tone on right upper extremity during them. Limited MRI brain no acute changes on DWI imaging. MRA normal. Overnight EEG 2/22-2/23 showed focal seizures arising from the left parieto-occipital region, he had an average for 3 seizures per hour lasting 2-4 minutes. He was initially on Levetiracetam  increased to 2000mg  BID, then Vimpat  100mg  BID added due to continued seizures, then clonazepam  1mg  TID as he continued to have seizures on 2/23-2/24 vEEG monitoring. Depakote  500mg  BID added on. EEG from 2/24-2/25 captured 4 focal seizures, last was on 2/24 at 3pm, then none for the next 24 hours. Seizures felt to be provoked seizures in the setting of hyperglycemia. He had a repeat brain MRI without contrast on 08/15/22 which was normal.  He and his wife deny any further seizures since his hospitalization, however Russell Robles notes that he is very unstable, speech is different, and  there is still confusion but different than the seizures. Speech is slower, softer, mumbling at times. He closes his eyes a lot and says his eyes are tired. He sleeps majority of the day, getting up for an hour then going back to sleep. He sleeps through the night. He states he can carry on conversations but  feels weak, he uses a cane to get up. He is dropping things all the time. His wife has to help him up sometimes. He denies any olfactory/gustatory hallucinations, focal numbness/tingling/weakness, myoclonic jerks. No headaches, dizziness, no further vision changes, neck pain/back pain, bowel dysfunction. He has some urinary urgency and sometimes incontinence. Glucose levels have improved, up to 200 but mostly between 110-120 at home. No alcohol use. He lives with his wife. He had been doing maintenance work prior to the seizures. He had a normal birth and early development.  There is no history of febrile convulsions, CNS infections such as meningitis/encephalitis, significant traumatic brain injury, neurosurgical procedures, or family history of seizures.  Diagnostic Data: Overnight EEG 2/22-2/23/24 showed focal seizures arising from the left parieto-occipital region, he had an average for 3 seizures per hour lasting 2-4 minutes. He continued to have seizures on 2/23-2/24 vEEG monitoring. EEG from 2/24-2/25 captured 4 focal seizures, last was on 2/24 at 3pm. 1-hour wake and sleep EEG in 08/2022 normal.  Brain MRI without contrast on 08/15/22 normal. Repeat brain MRI with and without contrast 08/2022 no acute changes, mild chronic microvascular disease, mild diffuse atrophy.   PAST MEDICAL HISTORY: Past Medical History:  Diagnosis Date   Anxiety    Arthritis    Cough    PER PT NON-PRODUCTIVE , NO FEVER OR CONGESTION   Hernia, inguinal, right    Hyperlipidemia    Hypertension    Type 2 diabetes mellitus ([name])    last  HbA1c 7.2 on 05-09-2017 in epic    MEDICATIONS: Current Outpatient Medications on File Prior to Visit  Medication Sig Dispense Refill   amLODipine -valsartan  (EXFORGE ) 5-160 MG tablet TAKE 1 TABLET BY MOUTH DAILY. (TAKE WITH HCTZ 12.5 MG TABLET) 90 tablet 3   aspirin EC 81 MG tablet Take 81 mg by mouth daily. Swallow whole.     Cholecalciferol (VITAMIN D3) 125 MCG (5000 UT) TABS  5,000 IU OTC vitamin D3 daily. (Patient taking differently: Take 5,000 Units by mouth daily at 12 noon. 5,000 IU OTC vitamin D3 daily.) 90 tablet 3   Continuous Glucose Sensor (DEXCOM G7 SENSOR) MISC USE AS DIRECTED 3 each 3   Cyanocobalamin (VITAMIN B12 PO) Take 1 tablet by mouth daily.     insulin  glargine-yfgn (SEMGLEE , YFGN,) 100 UNIT/ML injection Inject 0.25 mLs (25 Units total) into the skin daily. 10 mL 11   Insulin  Pen Needle 31G X 8 MM MISC Use with Insulin  Glargine to inject 25 units daily 100 each 4   Lacosamide  100 MG TABS Take 1 tablet (100 mg total) by mouth 2 (two) times daily. 180 tablet 3   levETIRAcetam  (KEPPRA ) 1000 MG tablet Take 1/2 tablet in AM, 1 tablet in PM 135 tablet 3   metFORMIN  (GLUCOPHAGE ) 1000 MG tablet Take 1 tablet (1,000 mg total) by mouth 2 (two) times daily with a meal. 180 tablet 3   Semaglutide , 1 MG/DOSE, 4 MG/3ML SOPN Inject 1 mg into the skin once a week. 3 mL 11   simvastatin  (ZOCOR ) 80 MG tablet TAKE 1 TABLET (80 MG TOTAL) BY MOUTH AT BEDTIME. 90 tablet 3   No current facility-administered medications  on file prior to visit.    ALLERGIES: No Known Allergies  FAMILY HISTORY: Family History  Problem Relation Age of Onset   COPD Mother    Aortic aneurysm Father    COPD Sister    Colon cancer Neg Hx    Colon polyps Neg Hx    Esophageal cancer Neg Hx    Rectal cancer Neg Hx    Stomach cancer Neg Hx     SOCIAL HISTORY: Social History   Socioeconomic History   Marital status: Married    Spouse name: Not on file   Number of children: 3   Years of education: Not on file   Highest education level: Not on file  Occupational History   Occupation: Buidling maintenance    Comment: Geophysicist/field seismologist  Tobacco Use   Smoking status: Never    Passive exposure: Past   Smokeless tobacco: Never  Vaping Use   Vaping status: Never Used  Substance and Sexual Activity   Alcohol use: Not Currently    Comment: occasional beer   Drug use: No    Sexual activity: Yes    Partners: Female    Birth control/protection: None  Other Topics Concern   Not on file  Social History Narrative   Are you right handed or left handed? Right handed    Are you currently employed ? 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 No   Who lives with you? Family    What type of home do you live in: 1 story or 2 story? 1 story       2 stepsons, 1 son       Social Drivers of Corporate investment banker Strain: Not on file  Food Insecurity: No Food Insecurity (08/18/2022)   Hunger Vital Sign    Worried About Running Out of Food in the Last Year: Never true    Ran Out of Food in the Last Year: Never true  Transportation Needs: No Transportation Needs (08/18/2022)   PRAPARE - Administrator, Civil Service (Medical): No    Lack of Transportation (Non-Medical): No  Physical Activity: Not on file  Stress: Not on file  Social Connections: Not on file  Intimate Partner Violence: Not At Risk (08/12/2022)   Humiliation, Afraid, Rape, and Kick questionnaire    Fear of Current or Ex-Partner: No    Emotionally Abused: No    Physically Abused: No    Sexually Abused: No     PHYSICAL EXAM: Vitals:   07/31/23 1512  BP: (!) 144/81  Pulse: 68  SpO2: 94%   General: No acute distress Head:  Normocephalic/atraumatic Skin/Extremities: No rash, no edema Neurological Exam: alert and awake. No aphasia or dysarthria. Fund of knowledge is appropriate.  Attention and concentration are normal.   Cranial nerves: Pupils equal, round. Extraocular movements intact with no nystagmus. Visual fields full.  No facial asymmetry.  Motor: Bulk and tone normal, muscle strength 5/5 throughout with no pronator drift.   Finger to nose testing intact.  Gait narrow-based and steady, mild difficulty with tandem walk. Romberg negative.   IMPRESSION: This is a 63 yo RH man with a history of DM2, hypertension,  hyperlipidemia, with new onset focal status epilepticus in February 2024. Seizures felt to be provoked in the setting of hyperglycemia. EEG showed focal seizures arising from the left parieto-occipital region. He has been seizure-free since 08/13/22. He continues to  report daytime drowsiness, as well as word-finding difficulties. We discussed reducing Levetiracetam  to 500mg  BID, continue Lacosamide  100mg  BID. He reports daytime drowsiness and apneic episodes, home sleep study will be ordered to assess for OSA. He is aware of [place] driving laws to stop driving after a seizure until 6 months seizure-free. Follow-up in 4 months, call for any changes.    Thank you for allowing me to participate in his care.  Please do not hesitate to call for any questions or concerns.    Rayfield Cairo, M.D.   CC: Dr. Letvak

## 2023-08-01 ENCOUNTER — Telehealth: Payer: Self-pay

## 2023-08-01 NOTE — Telephone Encounter (Signed)
Called and pt was Approved for Home sleep study # [id number]

## 2023-08-09 DIAGNOSIS — B349 Viral infection, unspecified: Secondary | ICD-10-CM | POA: Diagnosis not present

## 2023-08-09 DIAGNOSIS — R059 Cough, unspecified: Secondary | ICD-10-CM | POA: Diagnosis not present

## 2023-08-09 DIAGNOSIS — H6500 Acute serous otitis media, unspecified ear: Secondary | ICD-10-CM | POA: Diagnosis not present

## 2023-08-28 ENCOUNTER — Ambulatory Visit (HOSPITAL_BASED_OUTPATIENT_CLINIC_OR_DEPARTMENT_OTHER): Payer: BC Managed Care – PPO | Attending: Neurology | Admitting: Internal Medicine

## 2023-09-08 ENCOUNTER — Ambulatory Visit (HOSPITAL_COMMUNITY)
Admission: RE | Admit: 2023-09-08 | Discharge: 2023-09-08 | Disposition: A | Discharge status: Home / Self Care | Source: Ambulatory Visit | Attending: Cardiovascular Disease | Admitting: Cardiovascular Disease

## 2023-09-08 DIAGNOSIS — I77819 Aortic ectasia, unspecified site: Secondary | ICD-10-CM | POA: Diagnosis not present

## 2023-09-08 DIAGNOSIS — R918 Other nonspecific abnormal finding of lung field: Secondary | ICD-10-CM | POA: Diagnosis not present

## 2023-09-08 MED ORDER — IOHEXOL 350 MG/ML SOLN
75.0000 mL | Freq: Once | INTRAVENOUS | Status: AC | PRN
  Administered 2023-09-08: 75 mL via INTRAVENOUS

## 2023-09-08 NOTE — Progress Notes (Addendum)
 iStat Cr. 0.9

## 2023-09-13 LAB — POCT I-STAT CREATININE: Creatinine, Ser: 0.9 mg/dL (ref 0.61–1.24)

## 2023-09-15 ENCOUNTER — Other Ambulatory Visit: Payer: Self-pay | Admitting: Internal Medicine

## 2023-10-02 ENCOUNTER — Ambulatory Visit (HOSPITAL_BASED_OUTPATIENT_CLINIC_OR_DEPARTMENT_OTHER): Attending: Neurology | Admitting: Internal Medicine

## 2023-10-02 ENCOUNTER — Other Ambulatory Visit: Payer: Self-pay | Admitting: Internal Medicine

## 2023-10-02 VITALS — Ht 68.0 in | Wt 226.0 lb

## 2023-10-02 DIAGNOSIS — R400 Somnolence: Secondary | ICD-10-CM | POA: Insufficient documentation

## 2023-10-02 DIAGNOSIS — G4733 Obstructive sleep apnea (adult) (pediatric): Secondary | ICD-10-CM | POA: Diagnosis not present

## 2023-10-02 DIAGNOSIS — E1159 Type 2 diabetes mellitus with other circulatory complications: Secondary | ICD-10-CM

## 2023-10-16 ENCOUNTER — Other Ambulatory Visit: Payer: Self-pay | Admitting: Internal Medicine

## 2023-10-16 DIAGNOSIS — E1169 Type 2 diabetes mellitus with other specified complication: Secondary | ICD-10-CM

## 2023-10-21 DIAGNOSIS — R400 Somnolence: Secondary | ICD-10-CM | POA: Diagnosis not present

## 2023-10-21 NOTE — Procedures (Signed)
 [facility] [address] [place], [place] [address] Tel: [phone]   Fax: [phone]  Home Sleep Test Interpretation  Patient Name: Russell Robles, Russell Robles Date: 10/02/2023  Date of Birth: [birth year] Study Type: HST  Age: 63 year MRN #: [id number]  Sex: Male Interpreting Physician: Rosa [employer] [id number]  Height: 5\' 8"  Referring Physician: Dr. Jhonny Moss  Weight: 226.0 lbs Recording Tech: Alvah Auerbach RRT RPSGT RST  BMI: 34.7 Scoring Tech: Alvah Auerbach RRT RPSGT RST  ESS: 5 Neck Size: 16.5   Indications for Polysomnography The patient is a 63 year old Male who is 5\' 8"  and weighs 226.0 lbs. His BMI equals 34.7.  A home sleep apnea test was performed to evaluate for somnolence-.  Medication  None reported   Polysomnogram Data A home sleep test recorded the standard physiologic parameters including EKG, nasal and oral airflow.  Respiratory parameters of chest and abdominal movements were recorded with Respiratory Inductance Plethysmography belts.  Oxygen saturation was recorded by pulse oximetry.   Study Architecture The total recording time of the polysomnogram was 419.8 minutes.  The total monitoring time was 420.5 minutes.  Time spent in Supine position was 92.0 minutes.   Respiratory Events The study revealed a presence of 20 obstructive, 8 centrals, and - mixed apneas resulting in an Apnea index of 4.0 events per hour.  There were 117 hypopneas (>=3% desaturation and/or arousal) resulting in an Apnea\Hypopnea Index (AHI >=3% desaturation and/or arousal) of 20.7 events per hour.  There were 80 hypopneas (>=4% desaturation) resulting in an Apnea\Hypopnea Index (AHI >=4% desaturation) of 15.4 events per hour.  There were - Respiratory Effort Related Arousals resulting in a RERA index of - events per hour. The Respiratory Disturbance Index is 20.7 events per hour.  The snore index was - events per hour.  Mean oxygen saturation  was 91.8%.  The lowest oxygen saturation during monitoring time was 81.0%.  Time spent <=88% oxygen saturation was 20.5 minutes (4.9%).  Cardiac Summary The average pulse rate was 59.3 bpm.  The minimum pulse rate was 53.0 bpm while the maximum pulse rate was 81.0 bpm.  Cardiac rhythm was normal/abnormal.  Comment: Moderate obstructive sleep apnea, AHI (4%) 15.4/hr. Snoring with oxygen desaturation to a nadir of 81% and mean 91.8%.  Diagnosis: Obstructive sleep apnea  Recommendations: Suggest CPAP titration sleep study or autopap. Other options would be based on clinical judgment.   This study was personally reviewed and electronically signed by: Dr. Faustina Hood Accredited Board Certified in Sleep Medicine Date/Time: 10/21/23   12:39   Study Overview  Recording Time: 433.9 min. Monitoring Time: 420.5 min.  Analysis Start:  12:07:19 AM Supine Time: 92.0 min.  Analysis Stop:  07:07:05 AM     Study Summary   Count Index Longest Event Duration  Apneas & Hypopneas: 145 20.7  Apneas: 26.4 sec.     Hypopneas: 60.7 sec.  RERAs: - - - sec.  Desaturations: 146 20.8 65.7 sec.  Snores: - - - sec.    Minimum Oxygen Saturation: 81.0%    Respiratory Summary   Total Duration Supine Non-Supine   Count Index Average Longest Count Index Count Index  Obstructive Apnea 20 2.9 16.3 26.4 8 5.2 12 2.2   Mixed Apnea - - - - - - - -   Central Apnea 8 1.1 15.2 22.3 2 1.3 6 1.1   Total Apneas 28 4.0 16.0 26.4 10 6.5 18 3.3  Hypopneas 3% 117 16.7 N.A. N.A. 60 39.1 57 10.4   Apneas & Hyp. 3% 145 20.7 N.A. N.A. 70 45.7 75 13.7            Hypopneas 4% 80 11.4 N.A. N.A. 46 30.0 34 6.2  Apneas & Hyp. 4% 108 15.4 N.A. N.A. 56 36.5 52 9.5             RERAs - - - - - - - -  RDI 146 20.8 N.A. N.A. 70 45.7 76 13.9   Oxygen Saturation Summary   Total Supine Non-Supine  Average SpO2 91.8% 90.1% 92.2%  Minimum SpO2 81.0% 81.0% 84.0%   Maximum SpO2 98.0% 98.0% 98.0%   Oxygen Saturation  Distribution  Range (%) Time in range (min) Time in range (%)  90.0 - 100.0 329.2 78.7%  80.0 - 90.0 89.1 21.3%  70.0 - 80.0 - -  60.0 - 70.0 - -  50.0 - 60.0 - -  0.0 - 50.0 - -  Time Spent <=88% SpO2  Range (%) Time in range (min) Time in range (%)  0.0 - 88.0 20.5 4.9%  Cardiac Summary   Total Supine Non-Supine  Average Pulse Rate (BPM) 59.3 61.2 58.7  Minimum Pulse Rate (BPM) 53.0 54.0 53.0  Maximum Pulse Rate (BPM) 81.0 81.0 80.0                      Technologist Comments  -                        Jolee Naval, Biomedical engineer of Sleep Medicine  ELECTRONICALLY SIGNED ON:  10/21/2023, 12:31 PM  [facility] PH: [phone]   FX: [phone] ACCREDITED BY THE AMERICAN ACADEMY OF SLEEP MEDICINE

## 2023-10-23 ENCOUNTER — Telehealth: Payer: Self-pay

## 2023-10-23 DIAGNOSIS — G4730 Sleep apnea, unspecified: Secondary | ICD-10-CM

## 2023-10-23 NOTE — Telephone Encounter (Signed)
-----   Message from Jhonny Moss sent at 10/23/2023  2:31 PM EDT ----- Regarding: sleep study results Pls let him know the sleep study showed moderate sleep apnea, CPAP was recommended. Pls let him know we will order CPAP titration study and also refer him to Sleep Medicine for f/u on OSA.  Thanks ----- Message ----- From: Faustina Hood, MD Sent: 10/21/2023  12:53 PM EDT To: Helaine Llanos, MD; Jhonny Moss, MD

## 2023-10-23 NOTE — Telephone Encounter (Signed)
 Pt called an informed that sleep study showed moderate sleep apnea, CPAP was recommended. He was also informed that we will order CPAP titration study and also refer him to Sleep Medicine for f/u on OSA

## 2023-11-07 ENCOUNTER — Encounter (HOSPITAL_BASED_OUTPATIENT_CLINIC_OR_DEPARTMENT_OTHER): Payer: Self-pay

## 2023-11-09 ENCOUNTER — Ambulatory Visit: Payer: Self-pay | Admitting: Internal Medicine

## 2023-11-09 ENCOUNTER — Encounter: Payer: Self-pay | Admitting: Internal Medicine

## 2023-11-09 ENCOUNTER — Ambulatory Visit: Payer: No Typology Code available for payment source | Admitting: Internal Medicine

## 2023-11-09 VITALS — BP 136/80 | HR 63 | Temp 98.3°F | Ht 68.5 in | Wt 218.0 lb

## 2023-11-09 DIAGNOSIS — I10 Essential (primary) hypertension: Secondary | ICD-10-CM | POA: Diagnosis not present

## 2023-11-09 DIAGNOSIS — I358 Other nonrheumatic aortic valve disorders: Secondary | ICD-10-CM | POA: Diagnosis not present

## 2023-11-09 DIAGNOSIS — E1142 Type 2 diabetes mellitus with diabetic polyneuropathy: Secondary | ICD-10-CM

## 2023-11-09 DIAGNOSIS — E119 Type 2 diabetes mellitus without complications: Secondary | ICD-10-CM

## 2023-11-09 DIAGNOSIS — R569 Unspecified convulsions: Secondary | ICD-10-CM

## 2023-11-09 DIAGNOSIS — Z7985 Long-term (current) use of injectable non-insulin antidiabetic drugs: Secondary | ICD-10-CM

## 2023-11-09 LAB — POCT GLYCOSYLATED HEMOGLOBIN (HGB A1C): Hemoglobin A1C: 6.1 % — AB (ref 4.0–5.6)

## 2023-11-09 MED ORDER — SEMAGLUTIDE (1 MG/DOSE) 4 MG/3ML ~~LOC~~ SOPN: 1.0000 mg | PEN_INJECTOR | SUBCUTANEOUS | 3 refills | Status: DC

## 2023-11-09 NOTE — Assessment & Plan Note (Signed)
 No action needed.

## 2023-11-09 NOTE — Assessment & Plan Note (Signed)
 BP Readings from Last 3 Encounters:  11/09/23 136/80  07/31/23 (!) 144/81  05/12/23 138/88   Controlled on amlodipine /valsartan  5/160

## 2023-11-09 NOTE — Addendum Note (Signed)
 Addended by: Curt Dover I on: 11/09/2023 08:39 AM   Modules accepted: Orders

## 2023-11-09 NOTE — Assessment & Plan Note (Signed)
 A1c 6.1% now--excellent Ozempic  1mg  weekly (hold at this dose) Semglee  25 units daily, metformin  1000 bid

## 2023-11-09 NOTE — Progress Notes (Signed)
 Subjective:    Patient ID: Russell Robles, male    DOB: 09/13/1960, 63 y.o.   MRN: [id number]  HPI Here for follow up of diabetes and other chronic medical conditions  Has weaned down to 500 bid of keppra  Lacosamide  daily Will stay on some medication forever  Is using the Dexcom Has good control --mostly under 120 for average Back on ozempic --weight down a few pounds Still on 25 units of insulin  Only 1 mild low sugar reaction---sweating, etc. Better with eating something  No chest pain No SOB No dizziness or syncope  Nightly foot pain is worse--tingling. Occasional worse and raw feeling--massage helps   Current Outpatient Medications on File Prior to Visit  Medication Sig Dispense Refill   amLODipine -valsartan  (EXFORGE ) 5-160 MG tablet TAKE 1 TABLET BY MOUTH DAILY. (TAKE WITH HCTZ 12.5 MG TABLET) 90 tablet 0   aspirin EC 81 MG tablet Take 81 mg by mouth daily. Swallow whole.     Cholecalciferol (VITAMIN D3) 125 MCG (5000 UT) TABS 5,000 IU OTC vitamin D3 daily. (Patient taking differently: Take 5,000 Units by mouth daily at 12 noon. 5,000 IU OTC vitamin D3 daily.) 90 tablet 3   Continuous Glucose Sensor (DEXCOM G7 SENSOR) MISC USE AS DIRECTED 3 each 3   Cyanocobalamin (VITAMIN B12 PO) Take 1 tablet by mouth daily.     insulin  glargine-yfgn (SEMGLEE , YFGN,) 100 UNIT/ML injection Inject 0.25 mLs (25 Units total) into the skin daily. 10 mL 11   Insulin  Pen Needle 31G X 8 MM MISC Use with Insulin  Glargine to inject 25 units daily 100 each 4   Lacosamide  100 MG TABS Take 1 tablet (100 mg total) by mouth 2 (two) times daily. 180 tablet 3   levETIRAcetam  (KEPPRA ) 500 MG tablet Take 1 tablet (500 mg total) by mouth 2 (two) times daily. 180 tablet 3   metFORMIN  (GLUCOPHAGE ) 1000 MG tablet TAKE 1 TABLET BY MOUTH 2 TIMES DAILY WITH A MEAL. 180 tablet 4   Semaglutide , 1 MG/DOSE, 4 MG/3ML SOPN Inject 1 mg into the skin once a week. 3 mL 11   simvastatin  (ZOCOR ) 80 MG tablet TAKE 1  TABLET (80 MG TOTAL) BY MOUTH AT BEDTIME. 90 tablet 0   No current facility-administered medications on file prior to visit.    No Known Allergies  Past Medical History:  Diagnosis Date   Anxiety    Arthritis    Cough    PER PT NON-PRODUCTIVE , NO FEVER OR CONGESTION   Hernia, inguinal, right    Hyperlipidemia    Hypertension    Type 2 diabetes mellitus ([name])    last  HbA1c 7.2 on 05-09-2017 in epic    Past Surgical History:  Procedure Laterality Date   CARPAL TUNNEL RELEASE Left 08/02/2018   Procedure: CARPAL TUNNEL RELEASE;  Surgeon: Rande Bushy, MD;  Location: [facility];  Service: Orthopedics;  Laterality: Left;   HERNIA REPAIR N/A    Phreesia 05/18/2020   INGUINAL HERNIA REPAIR Left 12-16-2010  dr Delane Fear  [facility]   INGUINAL HERNIA REPAIR Right 06/01/2017   Procedure: OPEN RIGHT INGUINAL HERNIA REPAIR WITH MESH;  Surgeon: Kinsinger, Alphonso Aschoff, MD;  Location: [facility] [place];  Service: General;  Laterality: Right;  GENERAL AND TAP BLOCK   INSERTION OF MESH Right 06/01/2017   Procedure: INSERTION OF MESH;  Surgeon: Kinsinger, Alphonso Aschoff, MD;  Location: [facility];  Service: General;  Laterality: Right;  GENERAL AND TAP BLOCK    Family History  Problem Relation Age of Onset   COPD Mother    Aortic aneurysm Father    COPD Sister    Colon cancer Neg Hx    Colon polyps Neg Hx    Esophageal cancer Neg Hx    Rectal cancer Neg Hx    Stomach cancer Neg Hx     Social History   Socioeconomic History   Marital status: Married    Spouse name: Not on file   Number of children: 3   Years of education: Not on file   Highest education level: Some college, no degree  Occupational History   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 Right handed    Are you currently employed ? 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 No   Who lives with you? Family    What type of home do you live in: 1 story or 2 story? 1 story       2 stepsons, 1 son       Social Drivers of Corporate investment banker Strain: Low Risk  (11/08/2023)   Overall Financial Resource Strain (CARDIA)    Difficulty of Paying Living Expenses: Not hard at all  Food Insecurity: No Food Insecurity (11/08/2023)   Hunger Vital Sign    Worried About Running Out of Food in the Last Year: Never true    Ran Out of Food in the Last Year: Never true  Transportation Needs: No Transportation Needs (11/08/2023)   PRAPARE - Administrator, Civil Service (Medical): No    Lack of Transportation (Non-Medical): No  Physical Activity: Unknown (11/08/2023)   Exercise Vital Sign    Days of Exercise per Week: 0 days    Minutes of Exercise per Session: Not on file  Stress: No Stress Concern Present (11/08/2023)   [employer] of Occupational Health - Occupational Stress Questionnaire    Feeling of Stress : Not at all  Social Connections: Moderately Integrated (11/08/2023)   Social Connection and Isolation Panel [NHANES]    Frequency of Communication with Friends and Family: More than three times a week    Frequency of Social Gatherings with Friends and Family: Once a week    Attends Religious Services: More than 4 times per year    Active Member of [employer] or Organizations: No    Attends Banker Meetings: Not on file    Marital Status: Married  Catering manager Violence: Not At Risk (08/12/2022)   Humiliation, Afraid, Rape, and Kick questionnaire    Fear of  Current or Ex-Partner: No    Emotionally Abused: No    Physically Abused: No    Sexually Abused: No   Review of Systems Had sleeps study---AHI 20 Waiting for CPAP to start    Objective:   Physical Exam Constitutional:      Appearance: Normal appearance.  Cardiovascular:  Rate and Rhythm: Normal rate and regular rhythm.     Pulses: Normal pulses.     Heart sounds:     No gallop.     Comments: Coarse Gr 3/6 aortic systolic murmur Pulmonary:     Effort: Pulmonary effort is normal.     Breath sounds: Normal breath sounds. No wheezing or rales.  Musculoskeletal:     Cervical back: Neck supple.     Right lower leg: No edema.     Left lower leg: No edema.  Lymphadenopathy:     Cervical: No cervical adenopathy.  Skin:    Comments: No foot lesions  Neurological:     Mental Status: He is alert.            Assessment & Plan:

## 2023-11-09 NOTE — Assessment & Plan Note (Signed)
 Controlled and still weaning meds per Dr Ty Gales

## 2023-11-09 NOTE — Addendum Note (Signed)
 Addended by: Franne Ivory on: 11/09/2023 09:56 AM   Modules accepted: Orders

## 2023-11-22 ENCOUNTER — Other Ambulatory Visit (HOSPITAL_COMMUNITY): Payer: Self-pay

## 2023-11-22 ENCOUNTER — Telehealth: Payer: Self-pay

## 2023-11-22 NOTE — Telephone Encounter (Signed)
 Pharmacy Patient Advocate Encounter   Received notification from Onbase that prior authorization for Semglee  is required/requested.   Insurance verification completed.   The patient is insured through [facility] .   Per test claim: The current 40 day co-pay is, $120.00.  No PA needed at this time. This test claim was processed through [facility]- copay amounts may vary at other pharmacies due to pharmacy/plan contracts, or as the patient moves through the different stages of their insurance plan.

## 2023-12-05 ENCOUNTER — Ambulatory Visit: Payer: BC Managed Care – PPO | Admitting: Neurology

## 2023-12-05 ENCOUNTER — Encounter: Payer: Self-pay | Admitting: Neurology

## 2023-12-05 VITALS — BP 145/81 | HR 63 | Ht 69.0 in | Wt 217.4 lb

## 2023-12-05 DIAGNOSIS — G5601 Carpal tunnel syndrome, right upper limb: Secondary | ICD-10-CM

## 2023-12-05 DIAGNOSIS — R569 Unspecified convulsions: Secondary | ICD-10-CM

## 2023-12-05 MED ORDER — LACOSAMIDE 100 MG PO TABS: 100.0000 mg | ORAL_TABLET | Freq: Two times a day (BID) | ORAL | 3 refills | Status: DC

## 2023-12-05 MED ORDER — LEVETIRACETAM 500 MG PO TABS: 500.0000 mg | ORAL_TABLET | Freq: Two times a day (BID) | ORAL | 3 refills | Status: DC

## 2023-12-05 NOTE — Progress Notes (Signed)
 NEUROLOGY FOLLOW UP OFFICE NOTE  Russell Robles [id number] May 15, 1961  HISTORY OF PRESENT ILLNESS: I had the pleasure of seeing Russell Robles in follow-up in the neurology clinic on 12/05/2023.  The patient was last seen 4 months ago for seizures. He is alone in the office today. Records and images were personally reviewed where available.  He had new onset focal status epilepticus in 07/2022. MRI brain normal. Seizures felt to be provoked in the setting of hyperglycemia. He has been seizure-free since 07/2022 and was reporting daytime drowsiness on last visit. Levetiracetam  reduced to 500mg  BID, he is also on Lacosamide  100mg  BID. He did have a home sleep study done 09/2023 which showed moderate OSA. He was referred to Sleep Medicine but has not scheduled an appointment yet.   He has not noticed much change with daytime drowsiness on lower dose Levetiracetam . He has noticed an improvement in word-finding difficulties, he feels he is thinking too fast with conversations at work but he cannot get a word out, then it comes back in a few minutes. He denies any staring/unresponsive episodes, gaps in time, olfactory/gustatory hallucinations, focal weakness, myoclonic jerks. No headaches, dizziness, vision changes, no falls. At night, the 3 digits on his right hand in median nerve distribution go to sleep. Mood is good. He reports good energy.   History on Initial Assessment 08/22/2022: This is a 63 year old right-handed man with a history of DM2, hypertension, hyperlipidemia, in his usual state of health until mid-February when he started having episodes of dizziness with transient blurred vision. He would feel that everything from mid-point would move to the right for 3-5 minutes, then start over again, lasting 10-15 minutes. He is able to comprehend but would not form the sentences he wanted to. He went to the ER for these symptoms on 08/04/22 and reported recently stopping Ozempic  and starting Janumet  a few  days prior. Glucose was 510. Head CT no acute changes, there was an old left basal ganglia lacunar infarct and diffuse atrophy and chronic microvascular disease. On his PCP visit on 08/10/22, they reported moderate confusion, he was forgetting simple instructions, words, thought process changes completely, forgetting thought mid-sentence. He went to the ER later that day and was seen by Neurology, he reported a sensation of his vision moving to the right continuously, his wife reported freezing with a blank stare lasting seconds to minutes. He was witnessed to have 2 episodes of head and eye turn to the right, unresponsive, with lip smacking lasting 10-15 seconds. There was increased tone on right upper extremity during them. Limited MRI brain no acute changes on DWI imaging. MRA normal. Overnight EEG 2/22-2/23 showed focal seizures arising from the left parieto-occipital region, he had an average for 3 seizures per hour lasting 2-4 minutes. He was initially on Levetiracetam  increased to 2000mg  BID, then Vimpat  100mg  BID added due to continued seizures, then clonazepam  1mg  TID as he continued to have seizures on 2/23-2/24 vEEG monitoring. Depakote  500mg  BID added on. EEG from 2/24-2/25 captured 4 focal seizures, last was on 2/24 at 3pm, then none for the next 24 hours. Seizures felt to be provoked seizures in the setting of hyperglycemia. He had a repeat brain MRI without contrast on 08/15/22 which was normal.  He and his wife deny any further seizures since his hospitalization, however Russell Robles notes that he is very unstable, speech is different, and there is still confusion but different than the seizures. Speech is slower, softer, mumbling at times. He  closes his eyes a lot and says his eyes are tired. He sleeps majority of the day, getting up for an hour then going back to sleep. He sleeps through the night. He states he can carry on conversations but feels weak, he uses a cane to get up. He is dropping things all  the time. His wife has to help him up sometimes. He denies any olfactory/gustatory hallucinations, focal numbness/tingling/weakness, myoclonic jerks. No headaches, dizziness, no further vision changes, neck pain/back pain, bowel dysfunction. He has some urinary urgency and sometimes incontinence. Glucose levels have improved, up to 200 but mostly between 110-120 at home. No alcohol use. He lives with his wife. He had been doing maintenance work prior to the seizures. He had a normal birth and early development.  There is no history of febrile convulsions, CNS infections such as meningitis/encephalitis, significant traumatic brain injury, neurosurgical procedures, or family history of seizures.  Diagnostic Data: Overnight EEG 2/22-2/23/24 showed focal seizures arising from the left parieto-occipital region, he had an average for 3 seizures per hour lasting 2-4 minutes. He continued to have seizures on 2/23-2/24 vEEG monitoring. EEG from 2/24-2/25 captured 4 focal seizures, last was on 2/24 at 3pm. 1-hour wake and sleep EEG in 08/2022 normal.  Brain MRI without contrast on 08/15/22 normal. Repeat brain MRI with and without contrast 08/2022 no acute changes, mild chronic microvascular disease, mild diffuse atrophy.   PAST MEDICAL HISTORY: Past Medical History:  Diagnosis Date   Anxiety    Arthritis    Cough    PER PT NON-PRODUCTIVE , NO FEVER OR CONGESTION   Hernia, inguinal, right    Hyperlipidemia    Hypertension    Type 2 diabetes mellitus ([name])    last  HbA1c 7.2 on 05-09-2017 in epic    MEDICATIONS: Current Outpatient Medications on File Prior to Visit  Medication Sig Dispense Refill   amLODipine -valsartan  (EXFORGE ) 5-160 MG tablet TAKE 1 TABLET BY MOUTH DAILY. (TAKE WITH HCTZ 12.5 MG TABLET) 90 tablet 0   aspirin EC 81 MG tablet Take 81 mg by mouth daily. Swallow whole.     Cholecalciferol (VITAMIN D3) 125 MCG (5000 UT) TABS 5,000 IU OTC vitamin D3 daily. 90 tablet 3   Continuous Glucose  Sensor (DEXCOM G7 SENSOR) MISC USE AS DIRECTED 3 each 3   Cyanocobalamin (VITAMIN B12 PO) Take 1 tablet by mouth daily.     insulin  glargine-yfgn (SEMGLEE , YFGN,) 100 UNIT/ML injection Inject 0.25 mLs (25 Units total) into the skin daily. 10 mL 11   Insulin  Pen Needle 31G X 8 MM MISC Use with Insulin  Glargine to inject 25 units daily 100 each 4   Lacosamide  100 MG TABS Take 1 tablet (100 mg total) by mouth 2 (two) times daily. 180 tablet 3   levETIRAcetam  (KEPPRA ) 500 MG tablet Take 1 tablet (500 mg total) by mouth 2 (two) times daily. 180 tablet 3   metFORMIN  (GLUCOPHAGE ) 1000 MG tablet TAKE 1 TABLET BY MOUTH 2 TIMES DAILY WITH A MEAL. 180 tablet 4   Semaglutide , 1 MG/DOSE, 4 MG/3ML SOPN Inject 1 mg into the skin once a week. 9 mL 3   simvastatin  (ZOCOR ) 80 MG tablet TAKE 1 TABLET (80 MG TOTAL) BY MOUTH AT BEDTIME. 90 tablet 0   No current facility-administered medications on file prior to visit.    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 Right handed    Are you currently employed ? 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 No   Who lives with you? Family    What type of home do you live in: 1 story or 2 story? 1 story       2 stepsons, 1 son       Social Drivers of Corporate investment banker Strain: Low Risk  (11/08/2023)   Overall Financial Resource Strain (CARDIA)    Difficulty of Paying Living Expenses: Not hard at all  Food Insecurity: No Food Insecurity (11/08/2023)   Hunger Vital Sign    Worried About Running Out of Food in the Last Year: Never true    Ran Out of Food in the Last Year: Never true  Transportation Needs: No Transportation Needs (11/08/2023)   PRAPARE - Administrator, Civil Service (Medical): No    Lack of Transportation (Non-Medical): No  Physical Activity: Unknown (11/08/2023)   Exercise Vital Sign    Days of Exercise per Week: 0 days    Minutes of Exercise per Session: Not on file  Stress: No Stress Concern Present (11/08/2023)   [employer] of Occupational Health - Occupational Stress Questionnaire    Feeling of Stress : Not at all  Social Connections: Moderately Integrated (11/08/2023)   Social Connection and Isolation Panel    Frequency of Communication with Friends and Family: More than three times a week    Frequency of Social Gatherings with Friends and Family: Once a week    Attends Religious Services: More than 4 times per year    Active Member of [employer] or Organizations: No    Attends Banker Meetings: Not on file    Marital Status: Married  Catering manager Violence: Not At Risk (08/12/2022)   Humiliation, Afraid, Rape, and Kick questionnaire    Fear of Current or Ex-Partner: No    Emotionally Abused: No    Physically Abused: No    Sexually Abused: No     PHYSICAL EXAM: Vitals:   12/05/23 1546  BP: (!) 145/81  Pulse: 63  SpO2: 99%   General: No acute  distress Head:  Normocephalic/atraumatic Skin/Extremities: No rash, no edema Neurological Exam: alert and awake. No aphasia or dysarthria. Fund of knowledge is appropriate.  Attention and concentration are  normal.   Cranial nerves: Pupils equal, round. Extraocular movements intact with no nystagmus. Visual fields full.  No facial asymmetry.  Motor: Bulk and tone normal, muscle strength 5/5 throughout with no pronator drift.   Finger to nose testing intact.  Gait narrow-based and steady, able to tandem walk adequately.  Romberg negative. Negative Tinel sign at elbow and wrist.   IMPRESSION: This is a 63 yo RH man with a history of DM2, hypertension, hyperlipidemia, with new onset focal status epilepticus in February 2024. Seizures felt to be provoked in the setting of hyperglycemia. EEG showed focal seizures arising from the left parieto-occipital region. He has been seizure-free since 08/13/2022, now on lower dose Levetiracetam  500mg  BID and Lacosamide  100mg  BID. Continue current regimen for now. Proceed with Sleep medicine evaluation and management of OSA. He is aware of [place] driving laws to stop driving after a seizure until 6 months seizure-free. He will try using a wrist brace on the right for symptoms suggestive of CTS. Follow-up in 6 months, call for any changes.     Thank you for allowing me to participate in his care.  Please do not hesitate to call for any questions or concerns.    Rayfield Cairo, M.D.   CC: Dr. Letvak

## 2023-12-05 NOTE — Patient Instructions (Signed)
 Good to see you doing well.  Your new prescription for Keppra  (Levetiracetam ) 500mg  should be available: take 1 tablet twice a day  2. Continue Lacosamide  100mg  twice a day  3. Schedule Sleep Medicine evaluation  4. Follow-up in 6 months, call for any changes   Seizure Precautions: 1. If medication has been prescribed for you to prevent seizures, take it exactly as directed.  Do not stop taking the medicine without talking to your doctor first, even if you have not had a seizure in a long time.   2. Avoid activities in which a seizure would cause danger to yourself or to others.  Don't operate dangerous machinery, swim alone, or climb in high or dangerous places, such as on ladders, roofs, or girders.  Do not drive unless your doctor says you may.  3. If you have any warning that you may have a seizure, lay down in a safe place where you can't hurt yourself.    4.  No driving for 6 months from last seizure, as per [place]  state law.   Please refer to the following link on the Epilepsy Foundation of America's website for more information: http://www.epilepsyfoundation.org/answerplace/Social/driving/drivingu.cfm   5.  Maintain good sleep hygiene. Avoid alcohol.  6.  Contact your doctor if you have any problems that may be related to the medicine you are taking.  7.  Call 911 and bring the patient back to the ED if:        A.  The seizure lasts longer than 5 minutes.       B.  The patient doesn't awaken shortly after the seizure  C.  The patient has new problems such as difficulty seeing, speaking or moving  D.  The patient was injured during the seizure  E.  The patient has a temperature over 102 F (39C)  F.  The patient vomited and now is having trouble breathing

## 2023-12-11 ENCOUNTER — Telehealth: Payer: Self-pay

## 2023-12-11 NOTE — Transitions of Care (Post Inpatient/ED Visit) (Signed)
   12/11/2023  Name: Russell Robles MRN: [id number] DOB: Aug 29, 1960  Today's TOC FU Call Status: Today's TOC FU Call Status:: Unsuccessful Call (1st Attempt) Unsuccessful Call (1st Attempt) Date: 12/11/23  Attempted to reach the patient regarding the most recent Inpatient/ED visit. Left a HIPAA approved voice mail message requesting a return call.  Follow Up Plan: Additional outreach attempts will be made to reach the patient to complete the Transitions of Care (Post Inpatient/ED visit) call.   Richerd Fish, RN, BSN, CCM [facility], [facility] RN Care Manager Direct Dial: [phone]

## 2023-12-12 ENCOUNTER — Telehealth: Payer: Self-pay

## 2023-12-12 NOTE — Transitions of Care (Post Inpatient/ED Visit) (Signed)
   12/12/2023  Name: Russell Robles MRN: [id number] DOB: [birth year]  Today's TOC FU Call Status: Today's TOC FU Call Status:: Unsuccessful Call (2nd Attempt) Unsuccessful Call (2nd Attempt) Date: 12/12/23  Attempted to reach the patient regarding the most recent Inpatient/ED visit. Left voicemail, HIPAA approved requesting a return call.  Follow Up Plan: Additional outreach attempts will be made to reach the patient to complete the Transitions of Care (Post Inpatient/ED visit) call.   Richerd Fish, RN, BSN, CCM [facility], [facility] RN Care Manager Direct Dial: [phone]

## 2023-12-13 ENCOUNTER — Telehealth: Payer: Self-pay

## 2023-12-13 NOTE — Transitions of Care (Post Inpatient/ED Visit) (Signed)
   12/13/2023  Name: Russell Robles MRN: [id number] DOB: [birth year]  Today's TOC FU Call Status: Today's TOC FU Call Status:: Unsuccessful Call (3rd Attempt) Unsuccessful Call (3rd Attempt) Date: 12/13/23  Attempted to reach the patient regarding the most recent Inpatient/ED visit. Left a detailed message, HIPAA approved, DPR noted, requesting a return call.  Follow Up Plan: No further outreach attempts will be made at this time. We have been unable to contact the patient.  Richerd Fish, RN, BSN, CCM [facility], [facility] RN Care Manager Direct Dial: [phone]

## 2024-01-01 ENCOUNTER — Other Ambulatory Visit: Payer: Self-pay | Admitting: Internal Medicine

## 2024-01-01 DIAGNOSIS — E1159 Type 2 diabetes mellitus with other circulatory complications: Secondary | ICD-10-CM

## 2024-01-18 ENCOUNTER — Other Ambulatory Visit: Payer: Self-pay | Admitting: Internal Medicine

## 2024-01-22 ENCOUNTER — Other Ambulatory Visit: Payer: Self-pay | Admitting: Internal Medicine

## 2024-01-22 DIAGNOSIS — E1169 Type 2 diabetes mellitus with other specified complication: Secondary | ICD-10-CM

## 2024-01-22 NOTE — Telephone Encounter (Signed)
 Pt needs 6 month follow-up in November with provider he is choosing to change to due to Dr Marval retirement. Thanks

## 2024-04-30 ENCOUNTER — Encounter: Payer: Self-pay | Admitting: Neurology

## 2024-05-10 ENCOUNTER — Other Ambulatory Visit: Payer: Self-pay

## 2024-05-10 MED ORDER — SEMAGLUTIDE (1 MG/DOSE) 4 MG/3ML ~~LOC~~ SOPN: 1.0000 mg | PEN_INJECTOR | SUBCUTANEOUS | 1 refills | Status: DC

## 2024-05-21 ENCOUNTER — Other Ambulatory Visit: Payer: Self-pay

## 2024-05-21 DIAGNOSIS — E1169 Type 2 diabetes mellitus with other specified complication: Secondary | ICD-10-CM

## 2024-05-21 MED ORDER — SIMVASTATIN 80 MG PO TABS: 80.0000 mg | ORAL_TABLET | Freq: Every day | ORAL | 1 refills | Status: AC

## 2024-05-24 ENCOUNTER — Telehealth: Payer: Self-pay

## 2024-05-24 MED ORDER — LANTUS SOLOSTAR 100 UNIT/ML ~~LOC~~ SOPN: 25.0000 [IU] | PEN_INJECTOR | Freq: Every day | SUBCUTANEOUS | 1 refills | Status: AC

## 2024-05-24 NOTE — Telephone Encounter (Signed)
 Copied from CRM [id number]. Topic: Clinical - Prescription Issue >> May 24, 2024  1:21 PM Brittany M wrote: Reason for CRM: insulin  glargine-yfgn (SEMGLEE , YFGN,) 100 UNIT/ML injection is unavailable-- the Lantus  is covered and available- please contact pharmacy [facility] so they can get approval

## 2024-05-24 NOTE — Telephone Encounter (Signed)
 Sent in Lantus  since Lantus  and Semglee  are equivalent.

## 2024-05-30 ENCOUNTER — Telehealth: Payer: Self-pay

## 2024-05-30 ENCOUNTER — Other Ambulatory Visit (HOSPITAL_COMMUNITY): Payer: Self-pay

## 2024-05-31 ENCOUNTER — Other Ambulatory Visit (HOSPITAL_COMMUNITY): Payer: Self-pay

## 2024-05-31 NOTE — Telephone Encounter (Signed)
 Dr Jimmy was patient provider and Kenney Roys wrote the rx.  Pharmacy Patient Advocate Encounter   Received notification from Onbase that prior authorization for Ozempic  4 is required/requested.   Insurance verification completed.   The patient is insured through [facility].    Per test claim: The current 84 day co-pay is, $74.99.  No PA needed at this time. This test claim was processed through [facility]- copay amounts may vary at other pharmacies due to pharmacy/plan contracts, or as the patient moves through the different stages of their insurance plan.

## 2024-06-24 ENCOUNTER — Other Ambulatory Visit: Payer: Self-pay | Admitting: Neurology

## 2024-07-04 ENCOUNTER — Other Ambulatory Visit: Payer: Self-pay | Admitting: Family

## 2024-07-09 ENCOUNTER — Other Ambulatory Visit: Payer: Self-pay | Admitting: Family

## 2024-07-09 DIAGNOSIS — E1159 Type 2 diabetes mellitus with other circulatory complications: Secondary | ICD-10-CM

## 2024-07-16 ENCOUNTER — Encounter: Payer: Self-pay | Admitting: Neurology

## 2024-07-16 ENCOUNTER — Telehealth: Admitting: Neurology

## 2024-07-16 VITALS — Ht 68.0 in | Wt 215.0 lb

## 2024-07-16 DIAGNOSIS — R569 Unspecified convulsions: Secondary | ICD-10-CM | POA: Diagnosis not present

## 2024-07-16 MED ORDER — LACOSAMIDE 100 MG PO TABS: 1.0000 | ORAL_TABLET | Freq: Two times a day (BID) | ORAL | 3 refills | Status: AC

## 2024-07-16 MED ORDER — LEVETIRACETAM 500 MG PO TABS: 500.0000 mg | ORAL_TABLET | Freq: Two times a day (BID) | ORAL | 3 refills | Status: AC

## 2024-07-16 NOTE — Patient Instructions (Signed)
 Good to see you! Continue all your medications for now, we can discuss further adjustment after you see the sleep specialist and treat moderate sleep apnea better. Follow-up in 6 months, call for any changes.    Seizure Precautions: 1. If medication has been prescribed for you to prevent seizures, take it exactly as directed.  Do not stop taking the medicine without talking to your doctor first, even if you have not had a seizure in a long time.   2. Avoid activities in which a seizure would cause danger to yourself or to others.  Don't operate dangerous machinery, swim alone, or climb in high or dangerous places, such as on ladders, roofs, or girders.  Do not drive unless your doctor says you may.  3. If you have any warning that you may have a seizure, lay down in a safe place where you can't hurt yourself.    4.  No driving for 6 months from last seizure, as per [place]  state law.   Please refer to the following link on the [employer]'s website for more information: http://www.epilepsyfoundation.org/answerplace/Social/driving/drivingu.cfm   5.  Maintain good sleep hygiene. Avoid alcohol.  6.  Contact your doctor if you have any problems that may be related to the medicine you are taking.  7.  Call 911 and bring the patient back to the ED if:        A.  The seizure lasts longer than 5 minutes.       B.  The patient doesn't awaken shortly after the seizure  C.  The patient has new problems such as difficulty seeing, speaking or moving  D.  The patient was injured during the seizure  E.  The patient has a temperature over 102 F (39C)  F.  The patient vomited and now is having trouble breathing

## 2024-07-16 NOTE — Progress Notes (Signed)
 "  Virtual Visit via Video Note  This visit type was conducted with patient consent. This format is felt to be most appropriate for this patient at this time. Physical exam was limited by quality of the video and audio technology used for the visit.    Consent was obtained for video visit:  Yes.   Answered questions that patient had about telehealth interaction:  Yes.   Patient is aware of the limitations, risks, security and privacy concerns of performing an evaluation and management service by telemedicine. The patient expressed understanding and agreed to proceed.  Pt location: Home Physician Location: office Name of referring provider:  Jimmy Charlie FERNS, MD I connected with Lynwood Evalene Sharps atpatients initiation/request on 07/16/2024 at  1:20 PM EST by video enabled telemedicine application and verified that I am speaking with the correct person using two identifiers. Pt MRN:  [id number] Pt DOB:  09-Oct-1960 Video Participants:  Lynwood Evalene Sharps    History of Present Illness:  The patient had a virtual video visit on 07/16/2024. He was last seen in the neurology clinic 7 months ago for seizures. Since his last visit,he denies any seizures since 07/2022 on Levetiracetam  500mg  BID and Lacosamide  100mg  BID. We had reduced dose of LEV due to word-finding difficulties and daytime drowsiness, which have improved on lower dose. He feels pretty clear, every now and then he forgets the last part of what he was trying to say, but it comes back in a few minutes. He usually gets 7-8 hours of sleep and is awaiting his CPAP machine. Energy level is good. He denies any staring/unresponsive episodes, gaps in time, olfactory/gustatory hallucinations, focal numbness/tingling/weakness, myoclonic jerks. No headaches, dizziness, vision changes, no falls. Mood is good.    History on Initial Assessment 08/22/2022: This is a 64 year old right-handed man with a history of DM2, hypertension, hyperlipidemia, in his  usual state of health until mid-February when he started having episodes of dizziness with transient blurred vision. He would feel that everything from mid-point would move to the right for 3-5 minutes, then start over again, lasting 10-15 minutes. He is able to comprehend but would not form the sentences he wanted to. He went to the ER for these symptoms on 08/04/22 and reported recently stopping Ozempic  and starting Janumet  a few days prior. Glucose was 510. Head CT no acute changes, there was an old left basal ganglia lacunar infarct and diffuse atrophy and chronic microvascular disease. On his PCP visit on 08/10/22, they reported moderate confusion, he was forgetting simple instructions, words, thought process changes completely, forgetting thought mid-sentence. He went to the ER later that day and was seen by Neurology, he reported a sensation of his vision moving to the right continuously, his wife reported freezing with a blank stare lasting seconds to minutes. He was witnessed to have 2 episodes of head and eye turn to the right, unresponsive, with lip smacking lasting 10-15 seconds. There was increased tone on right upper extremity during them. Limited MRI brain no acute changes on [facility] imaging. MRA normal. Overnight EEG 2/22-2/23 showed focal seizures arising from the left parieto-occipital region, he had an average for 3 seizures per hour lasting 2-4 minutes. He was initially on Levetiracetam  increased to 2000mg  BID, then Vimpat  100mg  BID added due to continued seizures, then clonazepam  1mg  TID as he continued to have seizures on 2/23-2/24 vEEG monitoring. Depakote  500mg  BID added on. EEG from 2/24-2/25 captured 4 focal seizures, last was on 2/24 at 3pm,  then none for the next 24 hours. Seizures felt to be provoked seizures in the setting of hyperglycemia. He had a repeat brain MRI without contrast on 08/15/22 which was normal.  He and his wife deny any further seizures since his hospitalization, however  Verneita notes that he is very unstable, speech is different, and there is still confusion but different than the seizures. Speech is slower, softer, mumbling at times. He closes his eyes a lot and says his eyes are tired. He sleeps majority of the day, getting up for an hour then going back to sleep. He sleeps through the night. He states he can carry on conversations but feels weak, he uses a cane to get up. He is dropping things all the time. His wife has to help him up sometimes. He denies any olfactory/gustatory hallucinations, focal numbness/tingling/weakness, myoclonic jerks. No headaches, dizziness, no further vision changes, neck pain/back pain, bowel dysfunction. He has some urinary urgency and sometimes incontinence. Glucose levels have improved, up to 200 but mostly between 110-120 at home. No alcohol use. He lives with his wife. He had been doing maintenance work prior to the seizures. He had a normal birth and early development.  There is no history of febrile convulsions, CNS infections such as meningitis/encephalitis, significant traumatic brain injury, neurosurgical procedures, or family history of seizures.  Diagnostic Data: Overnight EEG 2/22-2/23/24 showed focal seizures arising from the left parieto-occipital region, he had an average for 3 seizures per hour lasting 2-4 minutes. He continued to have seizures on 2/23-2/24 vEEG monitoring. EEG from 2/24-2/25 captured 4 focal seizures, last was on 2/24 at 3pm. 1-hour wake and sleep EEG in 08/2022 normal.  Brain MRI without contrast on 08/15/22 normal. Repeat brain MRI with and without contrast 08/2022 no acute changes, mild chronic microvascular disease, mild diffuse atrophy.    Medications Ordered Prior to Encounter[1]   Observations/Objective:   Vitals:   07/16/24 1131  Weight: 215 lb (97.5 kg)  Height: 5' 8 (1.727 m)   GEN:  The patient appears stated age and is in NAD. Neurological examination: Patient is awake, alert. No aphasia  or dysarthria. Intact fluency and comprehension. Cranial nerves: Extraocular movements intact. No facial asymmetry. Motor: moves all extremities symmetrically, at least anti-gravity x 4.    Assessment and Plan:   This is a 64 yo RH man with a history of DM2, hypertension, hyperlipidemia, with new onset focal status epilepticus in February 2024. Seizures felt to be provoked in the setting of hyperglycemia. EEG showed focal seizures arising from the left parieto-occipital region. He has been seizure-free since 07/2022 on Levetiracetam  500mg  BID and Lacosamide  100mg  BID. He still notices occasional word-finding difficulties but feels better overall. Proceed with CPAP initiation and monitor symptoms with treatment of OSA. He is aware of [place] driving laws to stop driving after a seizure until 6 months seizure-free. Follow-up in 6 months, call for any changes.    Follow Up Instructions:   -I discussed the assessment and treatment plan with the patient. The patient was provided an opportunity to ask questions and all were answered. The patient agreed with the plan and demonstrated an understanding of the instructions.   The patient was advised to call back or seek an in-person evaluation if the symptoms worsen or if the condition fails to improve as anticipated.    Darice CHRISTELLA Shivers, MD     [1]  Current Outpatient Medications on File Prior to Visit  Medication Sig Dispense Refill   amLODipine -valsartan  (EXFORGE )  5-160 MG tablet TAKE 1 TABLET BY MOUTH DAILY. (TAKE WITH HCTZ 12.5 MG TABLET) 90 tablet 1   aspirin EC 81 MG tablet Take 81 mg by mouth daily. Swallow whole.     Cholecalciferol (VITAMIN D3) 125 MCG (5000 UT) TABS 5,000 IU OTC vitamin D3 daily. 90 tablet 3   Continuous Glucose Sensor (DEXCOM G7 SENSOR) MISC USE AS DIRECTED 3 each 3   Cyanocobalamin (VITAMIN B12 PO) Take 1 tablet by mouth daily.     insulin  glargine (LANTUS  SOLOSTAR) 100 UNIT/ML Solostar Pen Inject 25 Units into the skin daily.  15 mL 1   Insulin  Pen Needle (SURE COMFORT PEN NEEDLES) 31G X 8 MM MISC USE WITH INSULIN  GLARGINE TO INJECT 25 UNITS DAILY 100 each 4   Lacosamide  100 MG TABS TAKE 1 TABLET BY MOUTH 2 TIMES DAILY. 180 tablet 3   levETIRAcetam  (KEPPRA ) 500 MG tablet Take 1 tablet (500 mg total) by mouth 2 (two) times daily. 180 tablet 3   metFORMIN  (GLUCOPHAGE ) 1000 MG tablet TAKE 1 TABLET BY MOUTH 2 TIMES DAILY WITH A MEAL. 180 tablet 4   Semaglutide , 1 MG/DOSE, (OZEMPIC , 1 MG/DOSE,) 4 MG/3ML SOPN INJECT 1 MG INTO THE SKIN ONCE A WEEK 9 mL 0   simvastatin  (ZOCOR ) 80 MG tablet Take 1 tablet (80 mg total) by mouth at bedtime. 90 tablet 1   No current facility-administered medications on file prior to visit.   "

## 2024-08-27 ENCOUNTER — Encounter

## 2025-01-17 ENCOUNTER — Ambulatory Visit: Payer: Self-pay | Admitting: Neurology
